# Patient Record
Sex: Female | Born: 1970
Health system: Southern US, Community
[De-identification: ages and names within clinical notes are randomized; demographics above are authoritative.]

## PROBLEM LIST (undated history)

## (undated) DIAGNOSIS — C801 Malignant (primary) neoplasm, unspecified: Secondary | ICD-10-CM

## (undated) DIAGNOSIS — D219 Benign neoplasm of connective and other soft tissue, unspecified: Secondary | ICD-10-CM

## (undated) DIAGNOSIS — Z973 Presence of spectacles and contact lenses: Secondary | ICD-10-CM

## (undated) DIAGNOSIS — Z923 Personal history of irradiation: Secondary | ICD-10-CM

## (undated) DIAGNOSIS — N809 Endometriosis, unspecified: Secondary | ICD-10-CM

## (undated) DIAGNOSIS — E0500 Thyrotoxicosis with diffuse goiter without thyrotoxic crisis or storm: Secondary | ICD-10-CM

## (undated) DIAGNOSIS — I10 Essential (primary) hypertension: Secondary | ICD-10-CM

## (undated) DIAGNOSIS — Z9221 Personal history of antineoplastic chemotherapy: Secondary | ICD-10-CM

## (undated) DIAGNOSIS — J302 Other seasonal allergic rhinitis: Secondary | ICD-10-CM

## (undated) HISTORY — PX: BREAST LUMPECTOMY: SHX2

## (undated) HISTORY — DX: Benign neoplasm of connective and other soft tissue, unspecified: D21.9

## (undated) HISTORY — PX: TUBAL LIGATION: SHX77

---

## 1997-11-11 ENCOUNTER — Other Ambulatory Visit: Admission: RE | Admit: 1997-11-11 | Discharge: 1997-11-11 | Payer: Self-pay | Admitting: Obstetrics

## 1997-12-24 ENCOUNTER — Emergency Department (HOSPITAL_COMMUNITY): Admission: EM | Admit: 1997-12-24 | Discharge: 1997-12-24 | Payer: Self-pay | Admitting: Emergency Medicine

## 1999-01-14 ENCOUNTER — Emergency Department (HOSPITAL_COMMUNITY): Admission: EM | Admit: 1999-01-14 | Discharge: 1999-01-14 | Payer: Self-pay | Admitting: Emergency Medicine

## 2000-02-11 ENCOUNTER — Emergency Department (HOSPITAL_COMMUNITY): Admission: EM | Admit: 2000-02-11 | Discharge: 2000-02-11 | Payer: Self-pay | Admitting: Emergency Medicine

## 2000-10-19 ENCOUNTER — Emergency Department (HOSPITAL_COMMUNITY): Admission: EM | Admit: 2000-10-19 | Discharge: 2000-10-19 | Payer: Self-pay | Admitting: Emergency Medicine

## 2001-03-02 ENCOUNTER — Other Ambulatory Visit: Admission: RE | Admit: 2001-03-02 | Discharge: 2001-03-02 | Payer: Self-pay | Admitting: Obstetrics and Gynecology

## 2001-04-18 ENCOUNTER — Encounter: Payer: Self-pay | Admitting: Obstetrics and Gynecology

## 2001-04-18 ENCOUNTER — Ambulatory Visit (HOSPITAL_COMMUNITY): Admission: RE | Admit: 2001-04-18 | Discharge: 2001-04-18 | Payer: Self-pay | Admitting: Obstetrics and Gynecology

## 2001-08-03 ENCOUNTER — Inpatient Hospital Stay (HOSPITAL_COMMUNITY): Admission: AD | Admit: 2001-08-03 | Discharge: 2001-08-03 | Payer: Self-pay | Admitting: Obstetrics and Gynecology

## 2001-09-30 ENCOUNTER — Inpatient Hospital Stay (HOSPITAL_COMMUNITY): Admission: AD | Admit: 2001-09-30 | Discharge: 2001-10-02 | Payer: Self-pay | Admitting: Obstetrics and Gynecology

## 2002-02-25 ENCOUNTER — Other Ambulatory Visit: Admission: RE | Admit: 2002-02-25 | Discharge: 2002-02-25 | Payer: Self-pay | Admitting: Obstetrics and Gynecology

## 2003-06-03 ENCOUNTER — Other Ambulatory Visit: Admission: RE | Admit: 2003-06-03 | Discharge: 2003-06-03 | Payer: Self-pay | Admitting: Obstetrics and Gynecology

## 2004-08-06 ENCOUNTER — Other Ambulatory Visit: Admission: RE | Admit: 2004-08-06 | Discharge: 2004-08-06 | Payer: Self-pay | Admitting: Obstetrics and Gynecology

## 2004-08-11 ENCOUNTER — Ambulatory Visit: Payer: Self-pay | Admitting: Internal Medicine

## 2005-08-08 ENCOUNTER — Other Ambulatory Visit: Admission: RE | Admit: 2005-08-08 | Discharge: 2005-08-08 | Payer: Self-pay | Admitting: Obstetrics and Gynecology

## 2005-08-17 ENCOUNTER — Encounter: Admission: RE | Admit: 2005-08-17 | Discharge: 2005-08-17 | Payer: Self-pay | Admitting: Obstetrics and Gynecology

## 2006-10-11 ENCOUNTER — Encounter: Admission: RE | Admit: 2006-10-11 | Discharge: 2006-10-11 | Payer: Self-pay | Admitting: Obstetrics and Gynecology

## 2008-08-11 ENCOUNTER — Inpatient Hospital Stay (HOSPITAL_COMMUNITY): Admission: AD | Admit: 2008-08-11 | Discharge: 2008-08-11 | Payer: Self-pay | Admitting: Obstetrics and Gynecology

## 2008-09-16 ENCOUNTER — Inpatient Hospital Stay (HOSPITAL_COMMUNITY): Admission: AD | Admit: 2008-09-16 | Discharge: 2008-09-16 | Payer: Self-pay | Admitting: Obstetrics & Gynecology

## 2008-09-18 ENCOUNTER — Inpatient Hospital Stay (HOSPITAL_COMMUNITY): Admission: RE | Admit: 2008-09-18 | Discharge: 2008-09-20 | Payer: Self-pay | Admitting: Obstetrics and Gynecology

## 2008-09-18 HISTORY — PX: TUBAL LIGATION: SHX77

## 2008-09-19 ENCOUNTER — Encounter: Payer: Self-pay | Admitting: Obstetrics & Gynecology

## 2010-01-08 ENCOUNTER — Encounter: Admission: RE | Admit: 2010-01-08 | Discharge: 2010-01-08 | Payer: Self-pay | Admitting: Obstetrics and Gynecology

## 2010-09-29 LAB — CBC
HCT: 28.5 % — ABNORMAL LOW (ref 36.0–46.0)
HCT: 29.9 % — ABNORMAL LOW (ref 36.0–46.0)
Hemoglobin: 9.3 g/dL — ABNORMAL LOW (ref 12.0–15.0)
Hemoglobin: 9.8 g/dL — ABNORMAL LOW (ref 12.0–15.0)
MCHC: 32.7 g/dL (ref 30.0–36.0)
MCHC: 32.8 g/dL (ref 30.0–36.0)
MCV: 85.3 fL (ref 78.0–100.0)
MCV: 86.2 fL (ref 78.0–100.0)
Platelets: 126 10*3/uL — ABNORMAL LOW (ref 150–400)
Platelets: 141 10*3/uL — ABNORMAL LOW (ref 150–400)
RBC: 3.3 MIL/uL — ABNORMAL LOW (ref 3.87–5.11)
RBC: 3.5 MIL/uL — ABNORMAL LOW (ref 3.87–5.11)
RDW: 16.2 % — ABNORMAL HIGH (ref 11.5–15.5)
RDW: 16.5 % — ABNORMAL HIGH (ref 11.5–15.5)
WBC: 13.2 10*3/uL — ABNORMAL HIGH (ref 4.0–10.5)
WBC: 9.2 10*3/uL (ref 4.0–10.5)

## 2010-09-29 LAB — RPR: RPR Ser Ql: NONREACTIVE

## 2010-11-02 NOTE — H&P (Signed)
NAMELOLETA, FROMMELT         ACCOUNT NO.:  [id number]   MEDICAL RECORD NO.:  [id number]          PATIENT TYPE:  INP   LOCATION:  9168                          FACILITY:  [facility]   PHYSICIAN:  Janine Limbo, M.D.DATE OF BIRTH:  [birth year]   DATE OF ADMISSION:  09/18/2008  DATE OF DISCHARGE:                              HISTORY & PHYSICAL   Dawn Bates is a 40 year old gravida 7, para 2-0-4-2 at 40-4/7 weeks  who presented for induction secondary to polyhydramnios.  She was seen  on September 16, 2008, in the office with a nonreactive NST, but had a  reactive follow-up tracing in maternity admissions.  She had an  ultrasound that day with a questionable shoulder presentation versus  somewhat of an oblique lie.  She reports irregular mild contractions and  positive fetal movement.  Pregnancy has been remarkable for;  1. Polyhydramnios since 28 weeks.  2. TAB x4.  3. 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  4. Group B strep negative.  5. History of HPV.  6. Advanced maternal age with genetic screening declined.   PRENATAL LABS:  Blood type is A+, Rh antibody negative, VDRL  nonreactive, rubella titer positive, hepatitis B surface antigen  negative, HIV was nonreactive.  GC and chlamydia cultures were not done  at her first visit and were declined in the third trimester.  Hemoglobin  upon entering the practice was 12.3, it was 11 at 29 weeks.  The patient  declined first trimester screen and quadruple screen.  She had a normal  Glucola.  Group B strep culture was negative.   HISTORY OF PRESENT PREGNANCY:  The patient entered care at approximately  10 weeks.  She declined first trimester screen.  She had an ultrasound  at 10 weeks giving an [facility] of September 14, 2008, which was congruent with  LMP.  She had some urinary pain at 13 weeks and was treated with  Macrobid.  She had an ultrasound at 18 weeks showing normal growth and  fluid with a posterior  placenta.  She was measuring size greater than  dates at 26 weeks.  She had a normal Glucola.  Her ultrasound at 28  weeks showed an estimated fetal weight at the 75th percentile and AFI of  26.9, which is greater than the 97th percentile.  She then at 31 weeks  had an ultrasound showing the 95th percentile.  She had a fetal  fibronectin at that time that was negative.  She had another ultrasound  at 33 weeks showing fluid at the 75th percentile, then again at 35 weeks  she was noted to have polyhydramnios again with normal growth.  She then  was seen in the office this week for a visit.  She reported decreased  fetal movement.  She was sent to maternity admissions unit for a  nonstress test which was reactive, but she also had an ultrasound at the  office showing a questionable oblique lie with a possible shoulder  presentation.  She was scheduled for induction today per Dr. Lilian Coma  order in light of  her post dates state.   OBSTETRICAL HISTORY:  In 1996, she had a vaginal birth of a female infant,  weight 8 pounds at 38 weeks.  She was in labor 1-2 hours, she had no  anesthesia.  In 2003, she had a vaginal birth of a female infant, weight  7 pounds at 40 weeks, she is in labor 8 hours, she had no anesthesia.  There was some meconium in the fluid.   MEDICAL HISTORY:  She has been on birth control pills, Ortho Evra patch,  Depo-Provera and condoms.  She had one abnormal Pap, but a repeat Pap  was normal.  She had fibroids diagnosed in 1996, but these have not been  an issue for her pregnancy.  She has had a history of four TABs.  She  had a history of HSV 1-2 and has been on Valtrex and acyclovir for  prophylaxis.  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  She reports  usual childhood illnesses.  She is sensitive to latex.  She also has  seasonal allergies.   FAMILY HISTORY:  Her mother, maternal grandmother, maternal aunts and  maternal uncles all have hypertension.  Her mother,  maternal grandmother  and maternal uncles have diabetes.  Her maternal aunt has thyroid  disease and maternal grandmother had some type of renal disease.  Her  mother has had a stroke.  Her maternal uncles has had a stroke.  Her  mother has had some type of intestinal cancer.  Her sister had breast  cancer at age 34, and a paternal aunt had breast cancer.  A paternal  cousin has schizophrenia.   SURGICAL HISTORY:  The previously noted four TABs.   GENETIC HISTORY:  Remarkable for patient's age of 52.   SOCIAL HISTORY:  The patient is single.  The father of the baby has been  involved during this pregnancy.  He is not currently present with her.  The patient is Tree surgeon.  She denies a religious affiliation.  She has a bachelor's degree.  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  Her partner has a high school diploma, he is a Copy.  She has been followed by the physician service at [facility].  She denies any alcohol, drug or tobacco use during this pregnancy.   PHYSICAL EXAMINATION:  VITAL SIGNS:  Stable.  The patient is afebrile.  HEENT:  Within normal limits.  LUNGS:  Breath sounds are clear.  HEART:  Regular rate and rhythm without murmur.  BREASTS:  Soft and nontender.  ABDOMEN:  Fundal height is approximately 40 cm.  Estimated fetal weight  is 7-7-1/2 pounds.  There does feel to be a large amount of fluid around  the baby.  Fetal heart rate currently is nonreactive, but reassuring.  Uterine contractions are about every 8 minutes with some mild low  amplitude contractions between.  PELVIC:  Deferred at present.  There are no HSV lesions noted and no  prodrome reported.   ASSESSMENT:  1. Intrauterine pregnancy at 40-4/7 weeks.  2. Polyhydramnios.  3. Questionable abnormal lie.  4. Group B strep negative.   PLAN:  1. Admit to birthing suite with consult Dr. Stefano Gaul as attending      physician.  2. Check position with formal ultrasound.   3. Will consult with Dr. Stefano Gaul regarding plan of care after      ultrasound is completed.      Dawn Bates, C.N.M.  Janine Limbo, M.D.  Electronically Signed   VLL/MEDQ  D:  09/18/2008  T:  09/18/2008  Job:  [id number]

## 2010-11-02 NOTE — Op Note (Signed)
Dawn Bates, Dawn Bates         ACCOUNT NO.:  [id number]   MEDICAL RECORD NO.:  [id number]          PATIENT TYPE:  INP   LOCATION:  9103                          [facility]ILITY:  [facility]   PHYSICIAN:  Janine Limbo, M.D.DATE OF BIRTH:  [birth year]   DATE OF PROCEDURE:  09/18/2008  DATE OF DISCHARGE:                               OPERATIVE REPORT   PREOPERATIVE DIAGNOSES:  1. A 40 weeks and 4-day gestation.  2. Polyhydramnios.  3. Transverse presentation.   POSTOPERATIVE DIAGNOSES:  1. A 40 weeks and 4-day gestation.  2. Polyhydramnios.  3. Vertex presentation.   PROCEDURE:  External version.   SURGEON:  Janine Limbo, MD   FIRST ASSISTANT:  Renaldo Reel. Emilee Hero, C.N.M.   ANESTHETIC:  None.   DISPOSITION:  Dawn Bates is a 40 year old female, gravida 7, para 2-0-  4-2, who presents at 40 weeks and 4-day gestation.  She has been  followed at the [facility]  [facility].  The patient's pregnancy has been  complicated by polyhydramnios.  She was also noted to have an infant in  a transverse presentation.  We discussed our management options at this  point.  Our options include discharging to home and await spontaneous  labor.  The other option include an attempted external version followed  by induction.  We also discussed primary cesarean delivery.  The risk  and benefits of each of those options were outlined with the patient and  her husband.  My recommendation is that we attempt external version and  is successful then that we proceed with induction of labor.  After  careful consideration, the patient agrees to proceed with an attempted  external version.   FINDINGS:  Prior to her procedure an ultrasound was performed that  showed a single intrauterine gestation with a transverse presentation.  The head was noted to be on the maternal right.  The amniotic fluid  volume was above normal.  The placenta appeared  normal.  The fetal heart  motions were normal on ultrasound.  The nonstress test was noted to be  reactive.  The patient's blood type is A+.   PROCEDURE:  The patient was evaluated on the labor and delivery suite.  A repeat ultrasound was performed with findings as mentioned above.  The  patient's abdomen was then lubricated with ultrasound gel.  We then  applied pressure and the infant was rotated in a counterclockwise motion  until the fetal head was noted to be in the pelvis.  The ultrasound was  used to monitor the infant during our procedure and all fetal heart  motions were noted to be normal.  The patient was placed back on the  nonstress test monitor after successful version and the nonstress test  was noted to be reactive.  We checked the patient's cervix and her  cervix was noted to be fingertip dilated, 25-50% effaced, and the  presenting part was vertex, but still high in the pelvis.  The plan is  to begin Pitocin.  Once the vertex applied to the pelvis then we will  rupture membranes.  Janine Limbo, M.D.  Electronically Signed     AVS/MEDQ  D:  09/18/2008  T:  09/19/2008  Job:  [id number]

## 2010-11-02 NOTE — Op Note (Signed)
Dawn Bates, SIMIC         ACCOUNT NO.:  [id number]   MEDICAL RECORD NO.:  [id number]          PATIENT TYPE:  INP   LOCATION:  9103                          [facility]  [facility]   PHYSICIAN:  Naima A. Dillard, M.D. DATE OF BIRTH:  1971-04-08   DATE OF PROCEDURE:  09/19/2008  DATE OF DISCHARGE:                               OPERATIVE REPORT   PREOPERATIVE DIAGNOSIS:  Multiparity, desires tubal ligation.   POSTOPERATIVE DIAGNOSIS:  Multiparity, desires tubal ligation.   PROCEDURES:  Postpartum tubal ligation.   SURGEON:  Naima A. Dillard, M.D.   ASSISTANT:  There are no assistants.   ANESTHESIA:  Epidural.   FINDINGS:  Normal tubes bilaterally.   SPECIMENS:  Portions of both right and left tubes, sent to pathology.   ESTIMATED BLOOD LOSS:  There was minimal blood loss with no  complications.   The patient was taken to the PACU in stable condition.   PROCEDURE IN DETAIL:  The patient taken to the operating room where her  epidural anesthesia was found to be adequate.  A 1-cm infraumbilical  incision was made with a scalpel after 10 mL of local was placed in the  same area.  The fascia was dissected without difficulty and entered and  dissected bilaterally using a Mayo scissors and moved already in the  peritoneal cavity and the patient's left fallopian tube was grasped with  a Babcock clamp to the fimbriated end and about a centimeter of this  portion of tube was ligated with 2-0 plane and excised.  Hemostasis was  obtained, also using Bovie cautery.  Hemostasis was assured.  The  patient's right fallopian tube was grasped with Babcock clamp about a  centimeter of mid superior portion of the tube was ligated with 2-0  plane and excised and also made hemostatic with bovie cautery.  Hemostasis was assured.  Both tubes were returned to the abdomen.  The  fascia was reapproximated using 0-Vicryl.  The skin was reapproximated  using 3-0 Monocryl in a subcuticular fashion.   Sponge, lap, and needle  counts were correct and before the procedure, the patient was told the  risks of this  procedure to include, but not limited to bleeding, infection, and damage  to internal organs such as bowel and bladder and failure rate of about 1  in 200.  All contraception was also reviewed with the patient.  The  patient desired to proceed.      Naima A. Normand Sloop, M.D.  Electronically Signed     NAD/MEDQ  D:  09/19/2008  T:  09/20/2008  Job:  [id number]

## 2010-11-02 NOTE — Discharge Summary (Signed)
Dawn Bates, Dawn Bates         ACCOUNT NO.:  [id number]   MEDICAL RECORD NO.:  [id number]          PATIENT TYPE:  INP   LOCATION:  9103                          [facility]   PHYSICIAN:  Naima A. Dillard, M.D. DATE OF BIRTH:  [birth year]   DATE OF ADMISSION:  09/18/2008  DATE OF DISCHARGE:                               DISCHARGE SUMMARY   ADMISSION DIAGNOSES:  1. Intrauterine pregnancy at 40 and 4/7th weeks.  2. Polyhydramnios.  3. Abnormal fetal lie.   DISCHARGE DIAGNOSES:  1. Intrauterine pregnancy at 40 and 4/7th weeks, delivered.  2. Abnormal lie, resolved.  3. Desired bilateral tubal ligation.   PROCEDURES:  1. External cephalic version.  2. Spontaneous vaginal delivery.  3. Bilateral tubal ligation.   HOSPITAL COURSE:  [name] is a 40 year old gravida 7, para 2-0-4-2  admitted at 32 and 4/7th weeks for induction of labor due to  polyhydramnios.  The patient's pregnancy remarkable for,  1. Polyhydramnios since 28 weeks.  2. History of TAB x4.  3. History of HSV 1 and 2, no recent or current lesions.  4. Group B strep negative.  5. History of HPV.  6. Advanced maternal age with genetic screening declined.   Upon admission, fetal position was confirmed with oblique transverse lie  with ultrasound.  The risks, benefits, and alternatives of external  cephalic version versus an induction of labor as well as cesarean  section, discharge to home, and delivery were discussed with the patient  by Dr. Stefano Gaul.  After discussion, the patient elected to proceed with  external cephalic version.  Fetal heart rate upon admission was  reassuring.   On the day of admission, the patient underwent external cephalic version  with successful resolution of oblique transverse lie to vertex  presentation.  Due to this successful version, induction was started.  The patient's labor was induced with Pitocin.  The patient's labor  progressed and on the day of admission the  patient underwent a vaginal  delivery with an intact perineum and delivered a viable female infant,  [name], 8 pounds 8 ounces with Apgars of 8 and 9 at one and five minutes  respectively.  Estimated blood loss with vaginal delivery 300 mL.  No  perineal lacerations were noted.  The patient's delivery was  uncomplicated.   The patient desired bilateral tubal ligation and risks, benefits, and  alternatives were again reviewed with the patient.  On postpartum day  #1, the patient underwent a bilateral tubal ligation without  complications.  Estimated blood loss was minimal.  The patient with  epidural anesthesia for bilateral tubal ligation.  On postpartum day #1,  the patient's hemoglobin noted to be 9.3 and platelets 126,000.  Upon  admission, the patient's hemoglobin 9.8 and platelets 141,000.  On  postpartum day #2, postoperative day #1, the patient was ambulating,  voiding, and tolerating p.o. liquids and solids without difficulty.  The  patient with no abnormal bleeding.  It was felt that the patient  received full benefit of her hospital stay and was discharged to home.  The patient's condition on discharge was stable.   DISCHARGE INSTRUCTIONS:  Per [facility] handout.   DISCHARGE MEDICATIONS:  1. Motrin 600 mg p.o. q.6 h. p.r.n. pain.  2. Tylox 1-2 tablets every 4-6 hours p.r.n. pain.   Discharge follow up will occur in [facility] in 6 weeks.      Rhona Leavens, CNM      Naima A. Normand Sloop, M.D.  Electronically Signed    NOS/MEDQ  D:  09/20/2008  T:  09/20/2008  Job:  [id number]

## 2010-11-05 NOTE — H&P (Signed)
[facility]  Patient:    Dawn Bates, Dawn Bates Visit Number: [id number] MRN: [id number]          Service Type: OBS Location: 910A [id number] [id number] Attending Physician:  Michael Litter Dictated by:   Wynelle Bourgeois, CNM Admit Date:  09/30/2001                           History and Physical  HISTORY OF PRESENT ILLNESS:   This is a 40 year old gravida 4, para 1-0-2-1, at 40-2/7ths weeks, who presents with complaints of regular uterine contractions times several hours.  She denies leaking or bleeding, and reports positive fetal movement.  Her pregnancy has been followed by the M.D. service and remarkable for: 1. History of rapid labor.  2. HPV.  3. A family history of ovarian cancer and sickle L trait.  4. Group B strep negative.  OBSTETRICAL HISTORY:          Remarkable for an elective AB in 1989, an elective AB in 1993, a spontaneous vaginal delivery in 1996 remarkable for rapid labor of a female infant at [redacted] weeks gestation with less than one hour of labor.  PRENATAL LABORATORIES:        Hemoglobin 13.3, platelets 193.  Blood type A positive, antibody screen negative.  Sickle cell negative.  Rubella immune. RPR nonreactive.  Hepatitis negative.  HIV declined.  Pap test normal. Gonorrhea negative.  Chlamydia negative.  Glucose challenge within normal limits.  Group B strep negative.  MEDICAL HISTORY:              Remarkable for HPV, fibroids, a remote history of gonorrhea which was treated years ago and a history of migraines.  SURGICAL HISTORY:             Remarkable for wisdom teeth extraction.  GENETIC HISTORY:              Unremarkable.  SOCIAL HISTORY:               The patient is single, but involved with Janae Bridgeman who is involved and supportive.  She is of the [employer].  She denies any alcohol, tobacco or drug use.  OBJECTIVE DATA:  VITAL SIGNS:                  Vital signs stable, afebrile.  HEENT:                        Within  normal limits.  NECK:                         Thyroid normal, not enlarged.  BREASTS:                      Soft, nontender, no masses.  HEART RATE:                   Regular rate and rhythm.  CHEST:                        Clear.  ABDOMEN:                      Gravid, vertex to [employer].  EFM shows a reactive fetal heart rate without decelerations, uterine contractions every 5-7 minutes and strong.  PELVIC:  Cervical exam is 5 cm, completely effaced, -2 station, bulging bag of waters, and vertex presentation.  EXTREMITIES:                  Within normal limits.  ASSESSMENT:                   1. Active labor at term.                               2. Group B Streptococcus negative.  PLAN:                         1. Admit to Dr. Normand Sloop.                               2. Routine M.D. orders.                               3. Anticipate SVD. Dictated by:   Wynelle Bourgeois, CNM Attending Physician:  Michael Litter DD:  09/30/01 TD:  09/30/01 Job: [id number]

## 2012-02-15 ENCOUNTER — Other Ambulatory Visit: Payer: Self-pay | Admitting: Obstetrics and Gynecology

## 2012-02-15 DIAGNOSIS — Z1231 Encounter for screening mammogram for malignant neoplasm of breast: Secondary | ICD-10-CM

## 2012-02-24 ENCOUNTER — Ambulatory Visit
Admission: RE | Admit: 2012-02-24 | Discharge: 2012-02-24 | Disposition: A | Discharge status: Home / Self Care | Payer: BC Managed Care – PPO | Source: Ambulatory Visit | Attending: Obstetrics and Gynecology | Admitting: Obstetrics and Gynecology

## 2012-02-24 DIAGNOSIS — Z1231 Encounter for screening mammogram for malignant neoplasm of breast: Secondary | ICD-10-CM

## 2012-03-06 ENCOUNTER — Encounter: Payer: Self-pay | Admitting: Obstetrics and Gynecology

## 2012-04-13 ENCOUNTER — Ambulatory Visit: Payer: Self-pay | Admitting: Obstetrics and Gynecology

## 2012-04-23 ENCOUNTER — Ambulatory Visit: Payer: Self-pay | Admitting: Obstetrics and Gynecology

## 2012-05-23 ENCOUNTER — Ambulatory Visit: Payer: Self-pay | Admitting: Obstetrics and Gynecology

## 2012-07-03 ENCOUNTER — Ambulatory Visit: Payer: Self-pay | Admitting: Obstetrics and Gynecology

## 2012-07-26 ENCOUNTER — Ambulatory Visit: Payer: Self-pay | Admitting: Obstetrics and Gynecology

## 2012-08-28 ENCOUNTER — Ambulatory Visit: Payer: BC Managed Care – PPO | Admitting: Obstetrics and Gynecology

## 2012-08-28 ENCOUNTER — Encounter: Payer: Self-pay | Admitting: Obstetrics and Gynecology

## 2012-08-28 VITALS — BP 100/62 | Resp 14 | Ht 65.5 in | Wt 148.0 lb

## 2012-08-28 DIAGNOSIS — Z124 Encounter for screening for malignant neoplasm of cervix: Secondary | ICD-10-CM

## 2012-08-28 NOTE — Progress Notes (Signed)
Last Pap: 01/05/2011 WNL: Yes Regular Periods:yes "Cycles are heavy, pt asked b/c of fibroids are they the reason she is having clots? Contraception: BTL  Monthly Breast exam:no Tetanus<41yrs:no Nl.Bladder Function:yes Daily BMs:no "Once or twice a week" Healthy Diet:yes Calcium:no Mammogram:yes Date of Mammogram: 02/2012 "Dense Breast" Exercise:yes Have often Exercise: Walking 2-3 days a week Seatbelt: yes Abuse at home: no "Verbal" Stressful work:yes Sigmoid-colonoscopy: Never per pt Bone Density: No PCP: [facility]gent [facility] on [address] Change in PMH: Unchanged BP 100/62  Resp 14  Ht 5' 5.5" (1.664 m)  Wt 148 lb (67.132 kg)  BMI 24.25 kg/m2  LMP 08/18/2012 Pt with complaints:yes and pt states since her BTL she has had heavier cycles.  Changes a pad every 2-3 hrs.  No Cp or SOB Physical Examination: General appearance - alert, well appearing, and in no distress Mental status - normal mood, behavior, speech, dress, motor activity, and thought processes Neck - supple, no significant adenopathy,  thyroid exam: thyroid is normal in size without nodules or tenderness Chest - clear to auscultation, no wheezes, rales or rhonchi, symmetric air entry Heart - normal rate and regular rhythm Abdomen - soft, nontender, nondistended, no masses or organomegaly Breasts - breasts appear normal, no suspicious masses, no skin or nipple changes or axillary nodes Pelvic - normal external genitalia, vulva, vagina, cervix, uterus and adnexa Rectal - rectal exam not indicated Back exam - full range of motion, no tenderness, palpable spasm or pain on motion Neurological - alert, oriented, normal speech, no focal findings or movement disorder noted Musculoskeletal - no joint tenderness, deformity or swelling Extremities - no edema, redness or tenderness in the calves or thighs Skin - normal coloration and turgor, no rashes, no suspicious skin lesions noted Routine exam Pap sent yes Mammogram due 9/14   BTL used for contraception RT 1 yr.  Pt declined [place] and EMBX.  Will call if bleeding gets worse Change in ZOX:[id number]

## 2012-08-29 LAB — PAP IG W/ RFLX HPV ASCU

## 2013-03-25 ENCOUNTER — Ambulatory Visit
Admission: RE | Admit: 2013-03-25 | Discharge: 2013-03-25 | Disposition: A | Discharge status: Home / Self Care | Payer: 59 | Source: Ambulatory Visit | Attending: Obstetrics and Gynecology | Admitting: Obstetrics and Gynecology

## 2013-03-25 ENCOUNTER — Other Ambulatory Visit: Payer: Self-pay | Admitting: Obstetrics and Gynecology

## 2013-03-25 DIAGNOSIS — R05 Cough: Secondary | ICD-10-CM

## 2013-03-25 DIAGNOSIS — R053 Chronic cough: Secondary | ICD-10-CM

## 2013-04-18 ENCOUNTER — Other Ambulatory Visit: Payer: Self-pay

## 2013-04-18 DIAGNOSIS — Z1231 Encounter for screening mammogram for malignant neoplasm of breast: Secondary | ICD-10-CM

## 2013-04-25 ENCOUNTER — Other Ambulatory Visit: Payer: Self-pay

## 2013-06-10 ENCOUNTER — Ambulatory Visit
Admission: RE | Admit: 2013-06-10 | Discharge: 2013-06-10 | Disposition: A | Discharge status: Home / Self Care | Payer: 59 | Source: Ambulatory Visit

## 2013-06-10 ENCOUNTER — Ambulatory Visit: Payer: BC Managed Care – PPO

## 2013-06-10 DIAGNOSIS — Z1231 Encounter for screening mammogram for malignant neoplasm of breast: Secondary | ICD-10-CM

## 2014-04-21 ENCOUNTER — Encounter: Payer: Self-pay | Admitting: Obstetrics and Gynecology

## 2014-05-19 ENCOUNTER — Other Ambulatory Visit: Payer: Self-pay

## 2014-05-19 DIAGNOSIS — Z1231 Encounter for screening mammogram for malignant neoplasm of breast: Secondary | ICD-10-CM

## 2014-06-12 ENCOUNTER — Ambulatory Visit
Admission: RE | Admit: 2014-06-12 | Discharge: 2014-06-12 | Disposition: A | Discharge status: Home / Self Care | Payer: 59 | Source: Ambulatory Visit

## 2014-06-12 DIAGNOSIS — Z1231 Encounter for screening mammogram for malignant neoplasm of breast: Secondary | ICD-10-CM

## 2015-06-19 ENCOUNTER — Other Ambulatory Visit: Payer: Self-pay

## 2015-06-19 ENCOUNTER — Ambulatory Visit
Admission: RE | Admit: 2015-06-19 | Discharge: 2015-06-19 | Disposition: A | Discharge status: Home / Self Care | Payer: Commercial Managed Care - HMO | Source: Ambulatory Visit

## 2015-06-19 DIAGNOSIS — Z1231 Encounter for screening mammogram for malignant neoplasm of breast: Secondary | ICD-10-CM

## 2016-06-17 ENCOUNTER — Other Ambulatory Visit: Payer: Self-pay | Admitting: Obstetrics and Gynecology

## 2016-06-17 DIAGNOSIS — Z1231 Encounter for screening mammogram for malignant neoplasm of breast: Secondary | ICD-10-CM

## 2016-07-11 ENCOUNTER — Ambulatory Visit: Payer: Commercial Managed Care - HMO

## 2016-08-01 ENCOUNTER — Ambulatory Visit
Admission: RE | Admit: 2016-08-01 | Discharge: 2016-08-01 | Disposition: A | Discharge status: Home / Self Care | Payer: Commercial Managed Care - HMO | Source: Ambulatory Visit | Attending: Obstetrics and Gynecology | Admitting: Obstetrics and Gynecology

## 2016-08-01 DIAGNOSIS — Z1231 Encounter for screening mammogram for malignant neoplasm of breast: Secondary | ICD-10-CM

## 2017-08-25 ENCOUNTER — Other Ambulatory Visit: Payer: Self-pay | Admitting: Obstetrics and Gynecology

## 2017-08-25 ENCOUNTER — Observation Stay (HOSPITAL_COMMUNITY)
Admission: AD | Admit: 2017-08-25 | Discharge: 2017-08-26 | Disposition: A | Discharge status: Home / Self Care | Payer: 59 | Source: Ambulatory Visit | Attending: Obstetrics and Gynecology | Admitting: Obstetrics and Gynecology

## 2017-08-25 ENCOUNTER — Other Ambulatory Visit: Payer: Self-pay

## 2017-08-25 DIAGNOSIS — Z91040 Latex allergy status: Secondary | ICD-10-CM | POA: Diagnosis not present

## 2017-08-25 DIAGNOSIS — R5383 Other fatigue: Secondary | ICD-10-CM | POA: Diagnosis present

## 2017-08-25 DIAGNOSIS — D649 Anemia, unspecified: Secondary | ICD-10-CM | POA: Diagnosis not present

## 2017-08-25 LAB — CBC WITH DIFFERENTIAL/PLATELET
Basophils Absolute: 0.1 10*3/uL (ref 0.0–0.1)
Basophils Relative: 1 %
Eosinophils Absolute: 0.3 10*3/uL (ref 0.0–0.7)
Eosinophils Relative: 5 %
HCT: 21.8 % — ABNORMAL LOW (ref 36.0–46.0)
Hemoglobin: 5.9 g/dL — CL (ref 12.0–15.0)
Lymphocytes Relative: 50 %
Lymphs Abs: 2.4 10*3/uL (ref 0.7–4.0)
MCH: 16.3 pg — ABNORMAL LOW (ref 26.0–34.0)
MCHC: 27.1 g/dL — ABNORMAL LOW (ref 30.0–36.0)
MCV: 60.1 fL — ABNORMAL LOW (ref 78.0–100.0)
Monocytes Absolute: 0.3 10*3/uL (ref 0.1–1.0)
Monocytes Relative: 5 %
Neutro Abs: 2 10*3/uL (ref 1.7–7.7)
Neutrophils Relative %: 39 %
Other: 0 %
Platelets: 259 10*3/uL (ref 150–400)
RBC: 3.63 MIL/uL — ABNORMAL LOW (ref 3.87–5.11)
RDW: 20.9 % — ABNORMAL HIGH (ref 11.5–15.5)
WBC: 5.1 10*3/uL (ref 4.0–10.5)

## 2017-08-25 LAB — PREPARE RBC (CROSSMATCH)

## 2017-08-25 LAB — ABO/RH: ABO/RH(D): A POS

## 2017-08-25 MED ORDER — SODIUM CHLORIDE 0.9 % IV SOLN
Freq: Once | INTRAVENOUS | Status: AC
  Administered 2017-08-25: 17:00:00 via INTRAVENOUS

## 2017-08-25 MED ORDER — ACETAMINOPHEN 325 MG PO TABS
650.0000 mg | ORAL_TABLET | Freq: Once | ORAL | Status: AC
  Administered 2017-08-25: 650 mg via ORAL
  Filled 2017-08-25: qty 2

## 2017-08-25 MED ORDER — PRENATAL MULTIVITAMIN CH: 1.0000 | ORAL_TABLET | Freq: Every day | ORAL | Status: DC

## 2017-08-25 MED ORDER — DIPHENHYDRAMINE HCL 25 MG PO CAPS
25.0000 mg | ORAL_CAPSULE | Freq: Once | ORAL | Status: AC
  Administered 2017-08-25: 25 mg via ORAL
  Filled 2017-08-25: qty 1

## 2017-08-25 NOTE — H&P (Signed)
Reason for admission:   Severe anemia admitted for transfusion  History:     Dawn Bates is a 47 y.o. female, G7P3, presenting today for transfusion of PRC due to critical value anemia. She is followed by Dr Charlesetta Garibaldi at [facility] and was seen 08/24/17 in the office for her annual exam.Labb were drawn for complaints of fatigue resulting in normal TSH and Hgb  5.7 / hematocrit 21.9. She reports monthly menstrual cycles lasting 7 days with 4 heavy days requiring 1 pad every 2 hours with clots ad 2 cm. Dysmenorrhea for 48 hours, radiating to legs, 3/10. Has known about fibroids for 9 years. No recent evaluation.  Review of system:   Remarkable for fatigue,  Weight gain of 22 lbs in the last year, ankle edema and constipation while using Ferrous sulfate  Past Medical History:       Past Medical History:  Diagnosis Date  . Fibroids     Allergies:  Latex  Social History:   Social History        Socioeconomic History  . Marital status: Married    Spouse name: Not on file  . Number of children: Not on file  . Years of education: Not on file  . Highest education level: Not on file  Social Needs  . Financial resource strain: Not on file  . Food insecurity - worry: Not on file  . Food insecurity - inability: Not on file  . Transportation needs - medical: Not on file  . Transportation needs - non-medical: Not on file  Occupational History  . Not on file  Tobacco Use  . Smoking status: Never Smoker  . Smokeless tobacco: Never Used  Substance and Sexual Activity  . Alcohol use: No  . Drug use: No  . Sexual activity: Yes    Partners: Male    Birth control/protection: Surgical    Comment: BTL   Other Topics Concern  . Not on file  Social History Narrative  . Not on file    Family History:         Family History  Problem Relation Age of Onset  . Cancer Sister 59       breast "onset Late 20's"  . Breast cancer Sister   . Breast cancer  Maternal Aunt     Physical exam:    General Appearance: Alert, appropriate appearance for age. No acute distress Neck / Thyroid: Supple, no masses, nodes or enlargement Lungs: clear to auscultation bilaterally Back: No CVA tenderness. Cardiovascular: Regular rate and rhythm. S1, S2, no murmur Gastrointestinal: Soft, non-tender, no masses or organomegaly Pelvic Exam: normal except for enlarged irregular uterus per Dr Berneta Sages note   Assessment:   Severe anemia in patient known for fibroids, not currently bleeding   Plan:    23 hour observation Transfuse 3 units PRC with 4 hour Hgb/Ht Reviewed R&B of transfusion. Questions answered. Will plan for follow-up with Dr Charlesetta Garibaldi for management of menorrhagia Patient agreeable

## 2017-08-25 NOTE — H&P (Signed)
   Reason for admission:   Severe anemia admitted for transfusion  History:     Dawn Bates is a 47 y.o. female, G7P3, presenting today for transfusion of PRC due to critical value anemia. She is followed by Dr Charlesetta Garibaldi at [facility] and was seen 08/24/17 in the office for her annual exam.Labb were drawn for complaints of fatigue resulting in normal TSH and Hgb  5.7 / hematocrit 21.9. She reports monthly menstrual cycles lasting 7 days with 4 heavy days requiring   Review of system:   Remarkable for fatigue,  Weight gain of 22 lbs in the last year, ankle edema and constipation while using Ferrous sulfate  Past Medical History:   Past Medical History:  Diagnosis Date  . Fibroids     Allergies:  Latex  Social History:   Social History   Socioeconomic History  . Marital status: Married    Spouse name: Not on file  . Number of children: Not on file  . Years of education: Not on file  . Highest education level: Not on file  Social Needs  . Financial resource strain: Not on file  . Food insecurity - worry: Not on file  . Food insecurity - inability: Not on file  . Transportation needs - medical: Not on file  . Transportation needs - non-medical: Not on file  Occupational History  . Not on file  Tobacco Use  . Smoking status: Never Smoker  . Smokeless tobacco: Never Used  Substance and Sexual Activity  . Alcohol use: No  . Drug use: No  . Sexual activity: Yes    Partners: Male    Birth control/protection: Surgical    Comment: BTL   Other Topics Concern  . Not on file  Social History Narrative  . Not on file    Family History:    Family History  Problem Relation Age of Onset  . Cancer Sister 85       breast "onset Late 20's"  . Breast cancer Sister   . Breast cancer Maternal Aunt     Physical exam:    General Appearance: Alert, appropriate appearance for age. No acute distress Neck / Thyroid: Supple, no masses, nodes or enlargement Lungs: clear to  auscultation bilaterally Back: No CVA tenderness. Cardiovascular: Regular rate and rhythm. S1, S2, no murmur Gastrointestinal: Soft, non-tender, no masses or organomegaly Pelvic Exam: normal except for enlarged irregular uterus per Dr Berneta Sages note   Assessment:   Severe anemia in patient known for fibroids, not currently bleeding   Plan:

## 2017-08-26 ENCOUNTER — Encounter (HOSPITAL_COMMUNITY): Payer: Self-pay | Admitting: *Deleted

## 2017-08-26 DIAGNOSIS — D649 Anemia, unspecified: Secondary | ICD-10-CM | POA: Diagnosis not present

## 2017-08-26 LAB — HEMOGLOBIN AND HEMATOCRIT, BLOOD
HCT: 27.5 % — ABNORMAL LOW (ref 36.0–46.0)
Hemoglobin: 8.4 g/dL — ABNORMAL LOW (ref 12.0–15.0)

## 2017-08-26 NOTE — Discharge Summary (Addendum)
Physician Discharge Summary   Patient ID: Dawn Bates [id number] 47 y.o. [birth year]  Admit date: 08/25/2017  Discharge date and time: No discharge date for patient encounter.   Admitting Physician: Delsa Bern, MD   Discharge Physician: Delsa Bern, MD  Admission Diagnoses: Severe anemia  Discharge Diagnoses: Severe anemia  Admission Condition: good  Discharged Condition: good  Indication for Admission: Hgb of 5.9 and symptomatic  Hospital Course: Pt was fatigued with blood work drawn.  Was noted to have a hgb 5.9.  Pt was admitted for 3 units of PRBC.  Repeat CBC this am 8.4  Consults: None  Significant Diagnostic Studies: labs: Hgb 5.9 Treatments: blood transfusion  Discharge Exam: BP 119/71   Pulse (!) 58   Temp 97.7 F (36.5 C) (Oral)   Resp 20   Ht 5\' 4"  (1.626 m)   Wt 73.5 kg (162 lb)   SpO2 100%   BMI 27.81 kg/m  General alert orientated no distress Heart RRR Lungs clear Abd soft nondistended Legs negative       Disposition:   Patient Instructions:  Allergies as of 08/26/2017      Reactions   Latex Itching      Medication List    TAKE these medications   calcium carbonate 500 MG chewable tablet Commonly known as:  TUMS - dosed in mg elemental calcium Chew 1 tablet by mouth daily as needed for indigestion or heartburn.   fluocinonide ointment 0.05 % Commonly known as:  LIDEX Apply to scalp three times per week   levocetirizine 5 MG tablet Commonly known as:  XYZAL Take 5 mg by mouth every evening.   valACYclovir 500 MG tablet Commonly known as:  VALTREX valacyclovir 500 mg tablet  take 1 tablet by mouth once daily      Activity: activity as tolerated Diet: regular diet Wound Care: none needed  Follow-up with Dr. Charlesetta Garibaldi in 1 week.  Signed: Pleas Koch Prothero 08/26/2017 5:53 AM

## 2017-08-26 NOTE — Discharge Instructions (Signed)

## 2017-08-26 NOTE — Progress Notes (Signed)

## 2017-08-27 LAB — TYPE AND SCREEN
ABO/RH(D): A POS
Antibody Screen: NEGATIVE
Unit division: 0
Unit division: 0
Unit division: 0

## 2017-08-27 LAB — BPAM RBC
Blood Product Expiration Date: 201903252359
Blood Product Expiration Date: 201903252359
Blood Product Expiration Date: 201903272359
ISSUE DATE / TIME: 201903081948
ISSUE DATE / TIME: 201903082241
ISSUE DATE / TIME: 201903090125
Unit Type and Rh: 600
Unit Type and Rh: 600
Unit Type and Rh: 6200

## 2017-09-01 ENCOUNTER — Other Ambulatory Visit: Payer: Self-pay | Admitting: Obstetrics and Gynecology

## 2017-12-10 ENCOUNTER — Emergency Department (HOSPITAL_COMMUNITY)
Admission: EM | Admit: 2017-12-10 | Discharge: 2017-12-10 | Disposition: A | Discharge status: Home / Self Care | Payer: 59 | Attending: Emergency Medicine | Admitting: Emergency Medicine

## 2017-12-10 ENCOUNTER — Other Ambulatory Visit: Payer: Self-pay

## 2017-12-10 ENCOUNTER — Encounter (HOSPITAL_COMMUNITY): Payer: Self-pay | Admitting: Emergency Medicine

## 2017-12-10 DIAGNOSIS — Z79899 Other long term (current) drug therapy: Secondary | ICD-10-CM | POA: Insufficient documentation

## 2017-12-10 DIAGNOSIS — Y939 Activity, unspecified: Secondary | ICD-10-CM | POA: Diagnosis not present

## 2017-12-10 DIAGNOSIS — Y92410 Unspecified street and highway as the place of occurrence of the external cause: Secondary | ICD-10-CM | POA: Insufficient documentation

## 2017-12-10 DIAGNOSIS — S161XXA Strain of muscle, fascia and tendon at neck level, initial encounter: Secondary | ICD-10-CM | POA: Insufficient documentation

## 2017-12-10 DIAGNOSIS — R51 Headache: Secondary | ICD-10-CM | POA: Insufficient documentation

## 2017-12-10 DIAGNOSIS — Y998 Other external cause status: Secondary | ICD-10-CM | POA: Diagnosis not present

## 2017-12-10 DIAGNOSIS — Z91040 Latex allergy status: Secondary | ICD-10-CM | POA: Insufficient documentation

## 2017-12-10 DIAGNOSIS — S0990XA Unspecified injury of head, initial encounter: Secondary | ICD-10-CM | POA: Diagnosis present

## 2017-12-10 DIAGNOSIS — R519 Headache, unspecified: Secondary | ICD-10-CM

## 2017-12-10 DIAGNOSIS — T148XXA Other injury of unspecified body region, initial encounter: Secondary | ICD-10-CM

## 2017-12-10 HISTORY — DX: Other seasonal allergic rhinitis: J30.2

## 2017-12-10 MED ORDER — METHOCARBAMOL 500 MG PO TABS: 500.0000 mg | ORAL_TABLET | Freq: Every evening | ORAL | 0 refills | Status: DC | PRN
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
## 2017-12-10 NOTE — Discharge Instructions (Signed)
Take aleve 2 times a day with meals.  Do not take other anti-inflammatories at the same time open (Advil, Motrin, naproxen, ibuprofen). You may supplement with Tylenol if you need further pain control. Use robaxin as needed for muscle stiffness or soreness.  Have caution, this may make you tired or groggy.  Do not drive or operate heavy machinery while taking this medicine. Use heating pads to help control your pain. Try muscle creams (salonpas, icy hot, bengay) for pain relief. You will likely have continued muscle stiffness and soreness over the next couple days.  Follow-up with primary care for further evaluation. Return to the emergency room if you develop vision changes, vomiting, slurred speech, numbness, loss of bowel or bladder control, or any new or worsening symptoms.
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
## 2017-12-10 NOTE — ED Notes (Signed)
Patient verbalizes understanding of discharge instructions. Opportunity for questioning and answers were provided. Armband removed by staff, pt discharged from ED ambulatory.   

## 2018-06-20 DIAGNOSIS — U071 COVID-19: Secondary | ICD-10-CM

## 2018-06-20 HISTORY — DX: COVID-19: U07.1

## 2019-06-21 DIAGNOSIS — E559 Vitamin D deficiency, unspecified: Secondary | ICD-10-CM

## 2019-06-21 HISTORY — DX: Vitamin D deficiency, unspecified: E55.9

## 2019-07-16 ENCOUNTER — Ambulatory Visit: Payer: 59 | Admitting: Family Medicine

## 2019-07-16 ENCOUNTER — Other Ambulatory Visit: Payer: Self-pay

## 2019-07-16 ENCOUNTER — Encounter: Payer: Self-pay | Admitting: Family Medicine

## 2019-07-16 VITALS — BP 122/70 | HR 71 | Temp 96.3°F | Ht 66.0 in | Wt 159.0 lb

## 2019-07-16 DIAGNOSIS — R519 Headache, unspecified: Secondary | ICD-10-CM | POA: Diagnosis not present

## 2019-07-16 DIAGNOSIS — E049 Nontoxic goiter, unspecified: Secondary | ICD-10-CM

## 2019-07-16 DIAGNOSIS — J301 Allergic rhinitis due to pollen: Secondary | ICD-10-CM | POA: Diagnosis not present

## 2019-07-16 DIAGNOSIS — E559 Vitamin D deficiency, unspecified: Secondary | ICD-10-CM

## 2019-07-16 DIAGNOSIS — D500 Iron deficiency anemia secondary to blood loss (chronic): Secondary | ICD-10-CM

## 2019-07-16 DIAGNOSIS — E785 Hyperlipidemia, unspecified: Secondary | ICD-10-CM

## 2019-07-16 MED ORDER — MOMETASONE FUROATE 50 MCG/ACT NA SUSP: 2.0000 | Freq: Every day | NASAL | 3 refills | Status: DC

## 2019-07-16 MED ORDER — LEVOCETIRIZINE DIHYDROCHLORIDE 5 MG PO TABS: 5.0000 mg | ORAL_TABLET | Freq: Every evening | ORAL | 2 refills | Status: DC

## 2019-07-16 NOTE — Progress Notes (Signed)
HPI:   [name] is a 49 y.o. female, who is here today with her husband to establish care.  Former PCP: [facility]. Last preventive routine visit: 08/2018.  Chronic medical problems: Chronic headache, HLD,anemia,and allergies among some.  Concerns today: Refill on medications.  Nasal congestion and rhinorrhea, can not breath through her nose, interferes with sleep. She has tried different antihistaminics and Xyzal 5 mg helps for longer. She has symptoms all year long. She has not identified exacerbating or alleviating factors.  Negative for cough,SOB,or wheezing.  Tried Flonase,singulair (caused leg pain), Benadryl. According to pt,she already saw immunologist and allergy test was done;immunotherapy was not recommended.  Anemia:  Iron deficiency. She is on Iron supplementation. Heavy menses/fibroid tumors. Menstrual flow improved with Lysteda. She is not aware of coagulation disorders and negative FHx.  Negative for fever,chills,night sweats, frequent nose/gum bleed,easy bruising,melena,blood in stool,or gross hematuria.  No pica. No known FHx of colon cancer.  Lab Results  Component Value Date   WBC 5.1 08/25/2017   HGB 8.4 (L) 08/26/2017   HCT 27.5 (L) 08/26/2017   MCV 60.1 (L) 08/25/2017   PLT 259 08/25/2017   Last lab 08/2018.  HLD: She is on non pharmacologic treatment. "Little high." Negative for chest pain, dyspnea, palpitation, claudication, or edema.  Headaches have improved overtime. She has about 2-3 episodes per months of frontal pressure like headache. No associated visual changes,photophobia,N/V, or focal neurologic deficit.  She takes Fiorinal 50-325-40 mg daily prn.  Vit D deficiency: She is on Ergocalciferol 50,000 U weekly. She is not sure when 25 OH vit D was last checked.  Review of Systems  Constitutional: Positive for fatigue. Negative for activity change, appetite change and unexpected weight change.    HENT: Positive for postnasal drip. Negative for mouth sores, sore throat and trouble swallowing.   Eyes: Negative for redness and visual disturbance.  Gastrointestinal: Negative for abdominal pain.       Negative for changes in bowel habits.  Endocrine: Negative for cold intolerance and heat intolerance.  Genitourinary: Negative for decreased urine volume and dysuria.  Musculoskeletal: Negative for gait problem and myalgias.  Skin: Negative for rash and wound.  Allergic/Immunologic: Positive for environmental allergies.  Neurological: Negative for syncope, facial asymmetry and numbness.  Hematological: Negative for adenopathy.  Psychiatric/Behavioral: Positive for sleep disturbance. Negative for confusion.  Rest see pertinent positives and negatives per HPI.   Current Outpatient Medications on File Prior to Visit  Medication Sig Dispense Refill  . butalbital-aspirin-caffeine (FIORINAL) 50-325-40 MG capsule Take by mouth.    . Iron-FA-B Cmp-C-Biot-Probiotic (FUSION PLUS) CAPS Take 1 capsule by mouth daily.  3  . tranexamic acid (LYSTEDA) 650 MG TABS tablet Take 1,300 mg by mouth 3 (three) times daily.    . Vitamin D, Ergocalciferol, (DRISDOL) 1.25 MG (50000 UNIT) CAPS capsule Take 50,000 Units by mouth every 7 (seven) days.     No current facility-administered medications on file prior to visit.    Past Medical History:  Diagnosis Date  . Fibroids   . Seasonal allergies    Allergies  Allergen Reactions  . Latex Itching    Family History  Problem Relation Age of Onset  . Cancer Sister 24       breast "onset Late 20's"  . Breast cancer Sister   . Breast cancer Maternal Aunt     Social History   Socioeconomic History  . Marital status: Married    Spouse name: Not  on file  . Number of children: Not on file  . Years of education: Not on file  . Highest education level: Not on file  Occupational History  . Not on file  Tobacco Use  . Smoking status: Never Smoker  .  Smokeless tobacco: Never Used  Substance and Sexual Activity  . Alcohol use: No  . Drug use: No  . Sexual activity: Yes    Partners: Male    Birth control/protection: Surgical    Comment: BTL   Other Topics Concern  . Not on file  Social History Narrative  . Not on file   Social Determinants of Health   Financial Resource Strain:   . Difficulty of Paying Living Expenses: Not on file  Food Insecurity:   . Worried About Charity fundraiser in the Last Year: Not on file  . Ran Out of Food in the Last Year: Not on file  Transportation Needs:   . Lack of Transportation (Medical): Not on file  . Lack of Transportation (Non-Medical): Not on file  Physical Activity:   . Days of Exercise per Week: Not on file  . Minutes of Exercise per Session: Not on file  Stress:   . Feeling of Stress : Not on file  Social Connections:   . Frequency of Communication with Friends and Family: Not on file  . Frequency of Social Gatherings with Friends and Family: Not on file  . Attends Religious Services: Not on file  . Active Member of Clubs or Organizations: Not on file  . Attends Archivist Meetings: Not on file  . Marital Status: Not on file    Vitals:   07/16/19 0941  BP: 122/70  Pulse: 71  Temp: (!) 96.3 F (35.7 C)  SpO2: 97%   Body mass index is 25.66 kg/m.  Physical Exam  Nursing note and vitals reviewed. Constitutional: She is oriented to person, place, and time. She appears well-developed and well-nourished. She does not appear ill. No distress.  HENT:  Head: Normocephalic and atraumatic.  Right Ear: External ear normal.  Left Ear: External ear normal.  Nose: Rhinorrhea present. Right sinus exhibits no maxillary sinus tenderness and no frontal sinus tenderness. Left sinus exhibits no maxillary sinus tenderness and no frontal sinus tenderness.  Mouth/Throat: Oropharynx is clear and moist and mucous membranes are normal.  Cerumen impaction bilateral, not able to see  TM's. Hypertrophic turbinates. Nasal voice.  Eyes: Pupils are equal, round, and reactive to light. Conjunctivae are normal.  Neck: No tracheal deviation present. Thyromegaly present. No thyroid mass present.  Cardiovascular: Normal rate and regular rhythm.  No murmur heard. Pulses:      Dorsalis pedis pulses are 2+ on the right side and 2+ on the left side.  Respiratory: Effort normal and breath sounds normal. No respiratory distress.  GI: Soft. She exhibits no mass. There is no hepatomegaly. There is no abdominal tenderness.  Musculoskeletal:        General: No edema.  Lymphadenopathy:    She has no cervical adenopathy.  Neurological: She is alert and oriented to person, place, and time. She has normal strength. No cranial nerve deficit. Gait normal.  Skin: Skin is warm. No rash noted. No erythema.  Psychiatric: She has a normal mood and affect.  Well groomed, good eye contact.   ASSESSMENT AND PLAN:  Ms. Airabella was seen today for establish care.  Diagnoses and all orders for this visit:  Orders Placed This Encounter  Procedures  .  US THYROID  . CBC with Differential/Platelet  . VITAMIN D 25 Hydroxy (Vit-D Deficiency, Fractures)  . TSH  . Lipid panel  . Ferritin  . Basic metabolic panel   Enlarged thyroid gland Further recommendations will be given according to labs/imaging results.  Iron deficiency anemia due to chronic blood loss Continue iron supplementation. Following with gyn for DUB, which she reports as improved. We may need to consider hematology referral if numbers do not improve.  Headache, unspecified headache type Improved. Hx is not the typical for migraine headache. ? Tension headache and/or allergies. She would like to continue Fiorinal because she does not take it frequent and it helps.Some side effects discussed.  Non-seasonal allergic rhinitis due to pollen Problem is not well controlled. Nasonex nasal spray added today. Nasal saline  irrigations as needed. Continue Xyzal 5 mg daily.  -     mometasone (NASONEX) 50 MCG/ACT nasal spray; Place 2 sprays into the nose daily. -     levocetirizine (XYZAL) 5 MG tablet; Take 1 tablet (5 mg total) by mouth every evening.  Vitamin D deficiency, unspecified Continue Ergocalciferol 50,000 U weekly. Will follow labs and will give further recommendations accordingly.  Hyperlipidemia, unspecified hyperlipidemia type Continue low fat diet. Labs will be done next week and recommendations will be given according to 10 years CVD risk score and FLP results.   We dos not have lab service today, gave the option to go to [place] but she prefers to come back next week for fasting labs.   Return in about 6 weeks (around 08/27/2019) for Lab appt. F/u in 6 weeks.   Marquize Seib G. Martinique, MD  [facility]. [place] office.

## 2019-07-16 NOTE — Patient Instructions (Addendum)
A few things to remember from today's visit:   Enlarged thyroid gland - Plan: US THYROID, TSH  Iron deficiency anemia due to chronic blood loss - Plan: CBC with Differential/Platelet, Ferritin  Headache, unspecified headache type  Non-seasonal allergic rhinitis due to pollen - Plan: mometasone (NASONEX) 50 MCG/ACT nasal spray, levocetirizine (XYZAL) 5 MG tablet  Vitamin D deficiency, unspecified - Plan: Basic metabolic panel, VITAMIN D 25 Hydroxy (Vit-D Deficiency, Fractures)  Hyperlipidemia, unspecified hyperlipidemia type - Plan: Basic metabolic panel, Lipid panel  Nasonex nasal spray added today. Continue nasal saline irrigations as needed.  Please be sure medication list is accurate. If a new problem present, please set up appointment sooner than planned today.

## 2019-07-30 ENCOUNTER — Other Ambulatory Visit: Payer: Self-pay

## 2019-07-31 ENCOUNTER — Telehealth: Payer: Self-pay | Admitting: Family Medicine

## 2019-07-31 ENCOUNTER — Other Ambulatory Visit (INDEPENDENT_AMBULATORY_CARE_PROVIDER_SITE_OTHER): Payer: 59

## 2019-07-31 DIAGNOSIS — E559 Vitamin D deficiency, unspecified: Secondary | ICD-10-CM

## 2019-07-31 DIAGNOSIS — D500 Iron deficiency anemia secondary to blood loss (chronic): Secondary | ICD-10-CM

## 2019-07-31 DIAGNOSIS — E785 Hyperlipidemia, unspecified: Secondary | ICD-10-CM

## 2019-07-31 DIAGNOSIS — E049 Nontoxic goiter, unspecified: Secondary | ICD-10-CM

## 2019-07-31 NOTE — Telephone Encounter (Signed)
Pt has questions regarding her last visit about -cholesterol  -medication   (pt did not want to give me more info than that) Pt can reached at [phone]

## 2019-07-31 NOTE — Telephone Encounter (Signed)
Will wait for pt's lab results to come back, they were drawn this morning.

## 2019-08-01 ENCOUNTER — Telehealth: Payer: Self-pay | Admitting: Family Medicine

## 2019-08-01 DIAGNOSIS — J301 Allergic rhinitis due to pollen: Secondary | ICD-10-CM

## 2019-08-01 LAB — LIPID PANEL
Cholesterol: 194 mg/dL (ref <200)
HDL: 67 mg/dL (ref >=50)
LDL Cholesterol (Calc): 113 mg/dL (calc) — ABNORMAL HIGH
Non-HDL Cholesterol (Calc): 127 mg/dL (calc) (ref <130)
Total CHOL/HDL Ratio: 2.9 (calc) (ref <5.0)
Triglycerides: 56 mg/dL (ref <150)

## 2019-08-01 LAB — CBC WITH DIFFERENTIAL/PLATELET
Absolute Monocytes: 419 cells/uL (ref 200–950)
Basophils Absolute: 27 cells/uL (ref 0–200)
Basophils Relative: 0.3 %
Eosinophils Absolute: 73 cells/uL (ref 15–500)
Eosinophils Relative: 0.8 %
HCT: 39.2 % (ref 35.0–45.0)
Hemoglobin: 12.3 g/dL (ref 11.7–15.5)
Lymphs Abs: 1820 cells/uL (ref 850–3900)
MCH: 27.8 pg (ref 27.0–33.0)
MCHC: 31.4 g/dL — ABNORMAL LOW (ref 32.0–36.0)
MCV: 88.7 fL (ref 80.0–100.0)
MPV: 12.5 fL (ref 7.5–12.5)
Monocytes Relative: 4.6 %
Neutro Abs: 6761 cells/uL (ref 1500–7800)
Neutrophils Relative %: 74.3 %
Platelets: 178 10*3/uL (ref 140–400)
RBC: 4.42 10*6/uL (ref 3.80–5.10)
RDW: 14 % (ref 11.0–15.0)
Total Lymphocyte: 20 %
WBC: 9.1 10*3/uL (ref 3.8–10.8)

## 2019-08-01 LAB — BASIC METABOLIC PANEL
BUN: 11 mg/dL (ref 7–25)
CO2: 28 mmol/L (ref 20–32)
Calcium: 9.9 mg/dL (ref 8.6–10.2)
Chloride: 102 mmol/L (ref 98–110)
Creat: 0.78 mg/dL (ref 0.50–1.10)
Glucose, Bld: 105 mg/dL — ABNORMAL HIGH (ref 65–99)
Potassium: 3.9 mmol/L (ref 3.5–5.3)
Sodium: 138 mmol/L (ref 135–146)

## 2019-08-01 LAB — FERRITIN: Ferritin: 8 ng/mL — ABNORMAL LOW (ref 16–232)

## 2019-08-01 LAB — TSH: TSH: 1.27 mIU/L

## 2019-08-01 LAB — VITAMIN D 25 HYDROXY (VIT D DEFICIENCY, FRACTURES): Vit D, 25-Hydroxy: 66 ng/mL (ref 30–100)

## 2019-08-01 NOTE — Telephone Encounter (Signed)
Pharmacy told pt that they have not received prescription for nasonex (nasal spray). She is requesting it be sent over again.

## 2019-08-02 MED ORDER — MOMETASONE FUROATE 50 MCG/ACT NA SUSP: 2.0000 | Freq: Every day | NASAL | 3 refills | Status: DC

## 2019-08-02 NOTE — Telephone Encounter (Signed)
I tried contacting pt, unable to leave a voicemail due to mailbox being full. Waiting for labs to be reviewed by Dr. Martinique.

## 2019-08-02 NOTE — Telephone Encounter (Signed)
Rx resent.

## 2019-08-05 ENCOUNTER — Other Ambulatory Visit: Payer: Self-pay

## 2019-08-05 DIAGNOSIS — J301 Allergic rhinitis due to pollen: Secondary | ICD-10-CM

## 2019-08-05 MED ORDER — MOMETASONE FUROATE 50 MCG/ACT NA SUSP: 2.0000 | Freq: Every day | NASAL | 3 refills | Status: DC

## 2019-08-13 ENCOUNTER — Encounter: Payer: Self-pay | Admitting: Family Medicine

## 2019-08-13 ENCOUNTER — Other Ambulatory Visit: Payer: Self-pay | Admitting: Family Medicine

## 2019-08-13 DIAGNOSIS — D500 Iron deficiency anemia secondary to blood loss (chronic): Secondary | ICD-10-CM

## 2019-08-13 MED ORDER — FERROUS SULFATE 325 (65 FE) MG PO TABS: 325.0000 mg | ORAL_TABLET | Freq: Every day | ORAL | 3 refills | Status: DC

## 2019-08-14 ENCOUNTER — Other Ambulatory Visit: Payer: Self-pay | Admitting: Family Medicine

## 2019-08-14 DIAGNOSIS — E049 Nontoxic goiter, unspecified: Secondary | ICD-10-CM

## 2019-08-20 ENCOUNTER — Other Ambulatory Visit: Payer: Self-pay

## 2019-08-20 ENCOUNTER — Other Ambulatory Visit: Payer: Self-pay | Admitting: Obstetrics and Gynecology

## 2019-08-20 DIAGNOSIS — Z1231 Encounter for screening mammogram for malignant neoplasm of breast: Secondary | ICD-10-CM

## 2019-08-20 MED ORDER — VITAMIN D (ERGOCALCIFEROL) 1.25 MG (50000 UNIT) PO CAPS: 50000.0000 [IU] | ORAL_CAPSULE | ORAL | 3 refills | Status: DC

## 2019-08-22 ENCOUNTER — Ambulatory Visit
Admission: RE | Admit: 2019-08-22 | Discharge: 2019-08-22 | Disposition: A | Discharge status: Home / Self Care | Payer: 59 | Source: Ambulatory Visit | Attending: Family Medicine | Admitting: Family Medicine

## 2019-08-22 DIAGNOSIS — E049 Nontoxic goiter, unspecified: Secondary | ICD-10-CM

## 2019-08-27 ENCOUNTER — Encounter: Payer: Self-pay | Admitting: Family Medicine

## 2019-09-02 ENCOUNTER — Telehealth: Payer: Self-pay | Admitting: Family Medicine

## 2019-09-02 DIAGNOSIS — E049 Nontoxic goiter, unspecified: Secondary | ICD-10-CM

## 2019-09-02 NOTE — Telephone Encounter (Signed)
Pt done her ultra-sound for her thyroid and wondering what the next step is?  Pt can be reached at [phone] -ok to leave a detailed message per pt

## 2019-09-03 NOTE — Telephone Encounter (Signed)
I spoke with patient. We went over her ultrasound results. She stated that her older sister had her thyroid removed because the biopsy came back as cancerous. Patient wants to know if she needs any further testing at this time or if we just continue to monitor for now?

## 2019-09-06 NOTE — Telephone Encounter (Signed)
I called and spoke with patient. We went over the information below. She will talk with her sister and decide what to do. She will call us back next week with her decision.

## 2019-09-06 NOTE — Telephone Encounter (Signed)
We could arrange appointment with endocrinologist or plan on repeating ultrasound in 6 to 12 months. Thanks, BJ

## 2019-09-11 NOTE — Addendum Note (Signed)
Addended by: Rodrigo Ran on: 09/11/2019 04:21 PM   Modules accepted: Orders

## 2019-09-11 NOTE — Telephone Encounter (Signed)
I called and spoke with patient. She would like to move forward with Endo referral. Referral has been placed and patient is aware that they will contact her to set up an appointment.

## 2019-09-11 NOTE — Telephone Encounter (Signed)
Pt is requesting to speak to you regarding the conversation you both had on 09/06/19. Thanks

## 2019-09-17 ENCOUNTER — Other Ambulatory Visit: Payer: Self-pay

## 2019-09-17 ENCOUNTER — Ambulatory Visit
Admission: RE | Admit: 2019-09-17 | Discharge: 2019-09-17 | Disposition: A | Discharge status: Home / Self Care | Payer: 59 | Source: Ambulatory Visit | Attending: Obstetrics and Gynecology | Admitting: Obstetrics and Gynecology

## 2019-09-17 DIAGNOSIS — Z1231 Encounter for screening mammogram for malignant neoplasm of breast: Secondary | ICD-10-CM

## 2019-10-08 ENCOUNTER — Other Ambulatory Visit: Payer: Self-pay

## 2019-10-10 ENCOUNTER — Ambulatory Visit: Payer: 59 | Admitting: Endocrinology

## 2019-10-10 ENCOUNTER — Other Ambulatory Visit: Payer: Self-pay

## 2019-10-10 ENCOUNTER — Encounter: Payer: Self-pay | Admitting: Endocrinology

## 2019-10-10 DIAGNOSIS — E041 Nontoxic single thyroid nodule: Secondary | ICD-10-CM | POA: Insufficient documentation

## 2019-10-10 NOTE — Progress Notes (Signed)
Subjective:    Patient ID: Dawn Bates, female    DOB: 15-Apr-1971, 49 y.o.   MRN: [id number]  HPI Pt is referred by Dr Martinique, for nodular thyroid.  Pt was noted to have a nodule at the thyroid in early 2021.  she is unaware of ever having had thyroid problems in the past.  she has no h/o XRT or surgery to the neck.   Past Medical History:  Diagnosis Date  . Fibroids   . Seasonal allergies     Past Surgical History:  Procedure Laterality Date  . TUBAL LIGATION      Social History   Socioeconomic History  . Marital status: Married    Spouse name: Not on file  . Number of children: Not on file  . Years of education: Not on file  . Highest education level: Not on file  Occupational History  . Not on file  Tobacco Use  . Smoking status: Never Smoker  . Smokeless tobacco: Never Used  Substance and Sexual Activity  . Alcohol use: No  . Drug use: No  . Sexual activity: Yes    Partners: Male    Birth control/protection: Surgical    Comment: BTL   Other Topics Concern  . Not on file  Social History Narrative  . Not on file   Social Determinants of Health   Financial Resource Strain:   . Difficulty of Paying Living Expenses:   Food Insecurity:   . Worried About Charity fundraiser in the Last Year:   . Arboriculturist in the Last Year:   Transportation Needs:   . Film/video editor (Medical):   Marland Kitchen Lack of Transportation (Non-Medical):   Physical Activity:   . Days of Exercise per Week:   . Minutes of Exercise per Session:   Stress:   . Feeling of Stress :   Social Connections:   . Frequency of Communication with Friends and Family:   . Frequency of Social Gatherings with Friends and Family:   . Attends Religious Services:   . Active Member of Clubs or Organizations:   . Attends Archivist Meetings:   Marland Kitchen Marital Status:   Intimate Partner Violence:   . Fear of Current or Ex-Partner:   . Emotionally Abused:   Marland Kitchen Physically Abused:   .  Sexually Abused:     Current Outpatient Medications on File Prior to Visit  Medication Sig Dispense Refill  . BIOTIN PO Take 1 capsule by mouth daily.    . ferrous sulfate 325 (65 FE) MG tablet Take 1 tablet (325 mg total) by mouth daily. 90 tablet 3  . Iron-FA-B Cmp-C-Biot-Probiotic (FUSION PLUS) CAPS Take 1 capsule by mouth daily.  3  . levocetirizine (XYZAL) 5 MG tablet Take 1 tablet (5 mg total) by mouth every evening. 90 tablet 2  . mometasone (NASONEX) 50 MCG/ACT nasal spray Place 2 sprays into the nose daily. (Patient taking differently: Place 2 sprays into the nose as needed. ) 17 g 3  . tranexamic acid (LYSTEDA) 650 MG TABS tablet Take 1,300 mg by mouth 3 (three) times daily.    . Vitamin D, Ergocalciferol, (DRISDOL) 1.25 MG (50000 UNIT) CAPS capsule Take 1 capsule (50,000 Units total) by mouth every 7 (seven) days. 12 capsule 3   No current facility-administered medications on file prior to visit.    Allergies  Allergen Reactions  . Latex Itching    Family History  Problem Relation Age of Onset  .  Cancer Sister 76       breast "onset Late 20's"  . Breast cancer Sister   . Thyroid cancer Sister        pt does not know cell type  . Breast cancer Maternal Aunt     BP 138/80   Pulse 68   Ht 5\' 6"  (1.676 m)   Wt 162 lb (73.5 kg)   SpO2 98%   BMI 26.15 kg/m    Review of Systems Denies hoarseness, neck pain, sob, cough, dysphagia, diarrhea, flushing.  She reports cold intolerance and nasal congestion.  She has gained a few lbs.      Objective:   Physical Exam VS: see vs page GEN: no distress HEAD: head: no deformity eyes: no periorbital swelling, no proptosis external nose and ears are normal NECK: thyroid is slightly enlarged (R>L) CHEST WALL: no deformity LUNGS: clear to auscultation CV: reg rate and rhythm, no murmur.  MUSCULOSKELETAL: muscle bulk and strength are grossly normal.  no obvious joint swelling.  gait is normal and steady EXTEMITIES: no  deformity.  no edema PULSES: no carotid bruit NEURO:  cn 2-12 grossly intact.   readily moves all 4's.  sensation is intact to touch on all 4's.  No tremor SKIN:  Normal texture and temperature.  No rash or suspicious lesion is visible.  Not diaphoretic.   NODES:  None palpable at the neck PSYCH: alert, well-oriented.  Does not appear anxious nor depressed.    [place]: Normal sized though moderately heterogeneous appearing and potentially hyperemic thyroid gland, nonspecific though could be seen in the setting of a thyroiditis. 2. Solitary punctate left-sided thyroid nodule does not meet imaging criteria to recommend percutaneous sampling or continued dedicated follow-up.  Lab Results  Component Value Date   TSH 1.27 07/31/2019   I have reviewed outside records, and summarized: Pt was noted to have thyroid nodule, and referred here. Anemia, headache, and dyslipidemia were also addressed.      Assessment & Plan:  Thyroid nodule, new to me. Low risk of malignancy Pos FHX, new to me: check calcitonin  Patient Instructions  Blood tests are requested for you today.  We'll let you know about the results.   You should have the thyroid examined once a year.  Either Dr Martinique or I would be happy to do that.  You should have the ultrasound checked as needed.  I would be happy to see you back here as needed.

## 2019-10-10 NOTE — Patient Instructions (Signed)
Blood tests are requested for you today.  We'll let you know about the results.   You should have the thyroid examined once a year.  Either Dr Martinique or I would be happy to do that.  You should have the ultrasound checked as needed.  I would be happy to see you back here as needed.

## 2019-10-13 LAB — CALCITONIN: Calcitonin: <2 pg/mL (ref <=5)

## 2019-12-13 ENCOUNTER — Other Ambulatory Visit: Payer: Self-pay | Admitting: Obstetrics and Gynecology

## 2020-05-21 ENCOUNTER — Other Ambulatory Visit: Payer: Self-pay

## 2020-05-21 ENCOUNTER — Ambulatory Visit: Payer: 59 | Admitting: Family Medicine

## 2020-05-21 ENCOUNTER — Encounter: Payer: Self-pay | Admitting: Family Medicine

## 2020-05-21 VITALS — BP 130/70 | HR 97 | Temp 98.6°F | Wt 154.0 lb

## 2020-05-21 DIAGNOSIS — R600 Localized edema: Secondary | ICD-10-CM

## 2020-05-21 DIAGNOSIS — L299 Pruritus, unspecified: Secondary | ICD-10-CM

## 2020-05-21 DIAGNOSIS — R0602 Shortness of breath: Secondary | ICD-10-CM

## 2020-05-21 DIAGNOSIS — R5383 Other fatigue: Secondary | ICD-10-CM | POA: Diagnosis not present

## 2020-05-21 NOTE — Patient Instructions (Addendum)
Peripheral Edema  Peripheral edema is swelling that is caused by a buildup of fluid. Peripheral edema most often affects the lower legs, ankles, and feet. It can also develop in the arms, hands, and face. The area of the body that has peripheral edema will look swollen. It may also feel heavy or warm. Your clothes may start to feel tight. Pressing on the area may make a temporary dent in your skin. You may not be able to move your swollen arm or leg as much as usual. There are many causes of peripheral edema. It can happen because of a complication of other conditions such as congestive heart failure, kidney disease, or a problem with your blood circulation. It also can be a side effect of certain medicines or because of an infection. It often happens to women during pregnancy. Sometimes, the cause is not known. Follow these instructions at home: Managing pain, stiffness, and swelling   Raise (elevate) your legs while you are sitting or lying down.  Move around often to prevent stiffness and to lessen swelling.  Do not sit or stand for long periods of time.  Wear support stockings as told by your health care provider. Medicines  Take over-the-counter and prescription medicines only as told by your health care provider.  Your health care provider may prescribe medicine to help your body get rid of excess water (diuretic). General instructions  Pay attention to any changes in your symptoms.  Follow instructions from your health care provider about limiting salt (sodium) in your diet. Sometimes, eating less salt may reduce swelling.  Moisturize skin daily to help prevent skin from cracking and draining.  Keep all follow-up visits as told by your health care provider. This is important. Contact a health care provider if you have:  A fever.  Edema that starts suddenly or is getting worse, especially if you are pregnant or have a medical condition.  Swelling in only one  leg.  Increased swelling, redness, or pain in one or both of your legs.  Drainage or sores at the area where you have edema. Get help right away if you:  Develop shortness of breath, especially when you are lying down.  Have pain in your chest or abdomen.  Feel weak.  Feel faint. Summary  Peripheral edema is swelling that is caused by a buildup of fluid. Peripheral edema most often affects the lower legs, ankles, and feet.  Move around often to prevent stiffness and to lessen swelling. Do not sit or stand for long periods of time.  Pay attention to any changes in your symptoms.  Contact a health care provider if you have edema that starts suddenly or is getting worse, especially if you are pregnant or have a medical condition.  Get help right away if you develop shortness of breath, especially when lying down. This information is not intended to replace advice given to you by your health care provider. Make sure you discuss any questions you have with your health care provider. Document Revised: 02/28/2018 Document Reviewed: 02/28/2018 Elsevier Patient Education  [place] of Breath, Adult Shortness of breath is when a person has trouble breathing enough air or when a person feels like she or he is having trouble breathing in enough air. Shortness of breath could be a sign of a medical problem. Follow these instructions at home:   Pay attention to any changes in your symptoms.  Do not use any products that contain nicotine or tobacco, such  as cigarettes, e-cigarettes, and chewing tobacco.  Do not smoke. Smoking is a common cause of shortness of breath. If you need help quitting, ask your health care provider.  Avoid things that can irritate your airways, such as: ? Mold. ? Dust. ? Air pollution. ? Chemical fumes. ? Things that can cause allergy symptoms (allergens), if you have allergies.  Keep your living space clean and free of mold and  dust.  Rest as needed. Slowly return to your usual activities.  Take over-the-counter and prescription medicines only as told by your health care provider. This includes oxygen therapy and inhaled medicines.  Keep all follow-up visits as told by your health care provider. This is important. Contact a health care provider if:  Your condition does not improve as soon as expected.  You have a hard time doing your normal activities, even after you rest.  You have new symptoms. Get help right away if:  Your shortness of breath gets worse.  You have shortness of breath when you are resting.  You feel light-headed or you faint.  You have a cough that is not controlled with medicines.  You cough up blood.  You have pain with breathing.  You have pain in your chest, arms, shoulders, or abdomen.  You have a fever.  You cannot walk up stairs or exercise the way that you normally do. These symptoms may represent a serious problem that is an emergency. Do not wait to see if the symptoms will go away. Get medical help right away. Call your local emergency services (911 in the [place] Do not drive yourself to the hospital. Summary  Shortness of breath is when a person has trouble breathing enough air. It can be a sign of a medical problem.  Avoid things that irritate your lungs, such as smoking, pollution, mold, and dust.  Pay attention to changes in your symptoms and contact your health care provider if you have a hard time completing daily activities because of shortness of breath. This information is not intended to replace advice given to you by your health care provider. Make sure you discuss any questions you have with your health care provider. Document Revised: 11/06/2017 Document Reviewed: 11/06/2017 Elsevier Patient Education  [place].  Pruritus Pruritus is an itchy feeling on the skin. One of the most common causes is dry skin, but many different things can cause  itching. Most cases of itching do not require medical attention. Sometimes itchy skin can turn into a rash. Follow these instructions at home: Skin care   Apply moisturizing lotion to your skin as needed. Lotion that contains petroleum jelly is best.  Take medicines or apply medicated creams only as told by your health care provider. This may include: ? Corticosteroid cream. ? Anti-itch lotions. ? Oral antihistamines.  Apply a cool, wet cloth (cool compress) to the affected areas.  Take baths with one of the following: ? Epsom salts. You can get these at your local pharmacy or grocery store. Follow the instructions on the packaging. ? Baking soda. Pour a small amount into the bath as told by your health care provider. ? Colloidal oatmeal. You can get this at your local pharmacy or grocery store. Follow the instructions on the packaging.  Apply baking soda paste to your skin. To make the paste, stir water into a small amount of baking soda until it reaches a paste-like consistency.  Do not scratch your skin.  Do not take hot showers or baths, which  can make itching worse. A cool shower may help with itching as long as you apply moisturizing lotion after the shower.  Do not use scented soaps, detergents, perfumes, and cosmetic products. Instead, use gentle, unscented versions of these items. General instructions  Avoid wearing tight clothes.  Keep a journal to help find out what is causing your itching. Write down: ? What you eat and drink. ? What cosmetic products you use. ? What soaps or detergents you use. ? What you wear, including jewelry.  Use a humidifier. This keeps the air moist, which helps to prevent dry skin.  Be aware of any changes in your itchiness. Contact a health care provider if:  The itching does not go away after several days.  You are unusually thirsty or urinating more than normal.  Your skin tingles or feels numb.  Your skin or the white parts of  your eyes turn yellow (jaundice).  You feel weak.  You have any of the following: ? Night sweats. ? Tiredness (fatigue). ? Weight loss. ? Abdominal pain. Summary  Pruritus is an itchy feeling on the skin. One of the most common causes is dry skin, but many different conditions and factors can cause itching.  Apply moisturizing lotion to your skin as needed. Lotion that contains petroleum jelly is best.  Take medicines or apply medicated creams only as told by your health care provider.  Do not take hot showers or baths. Do not use scented soaps, detergents, perfumes, or cosmetic products. This information is not intended to replace advice given to you by your health care provider. Make sure you discuss any questions you have with your health care provider. Document Revised: 06/20/2017 Document Reviewed: 06/20/2017 Elsevier Patient Education  [place].  Fatigue If you have fatigue, you feel tired all the time and have a lack of energy or a lack of motivation. Fatigue may make it difficult to start or complete tasks because of exhaustion. In general, occasional or mild fatigue is often a normal response to activity or life. However, long-lasting (chronic) or extreme fatigue may be a symptom of a medical condition. Follow these instructions at home: General instructions  Watch your fatigue for any changes.  Go to bed and get up at the same time every day.  Avoid fatigue by pacing yourself during the day and getting enough sleep at night.  Maintain a healthy weight. Medicines  Take over-the-counter and prescription medicines only as told by your health care provider.  Take a multivitamin, if told by your health care provider.  Do not use herbal or dietary supplements unless they are approved by your health care provider. Activity   Exercise regularly, as told by your health care provider.  Use or practice techniques to help you relax, such as yoga, tai chi,  meditation, or massage therapy. Eating and drinking   Avoid heavy meals in the evening.  Eat a well-balanced diet, which includes lean proteins, whole grains, plenty of fruits and vegetables, and low-fat dairy products.  Avoid consuming too much caffeine.  Avoid the use of alcohol.  Drink enough fluid to keep your urine pale yellow. Lifestyle  Change situations that cause you stress. Try to keep your work and personal schedule in balance.  Do not use any products that contain nicotine or tobacco, such as cigarettes and e-cigarettes. If you need help quitting, ask your health care provider.  Do not use drugs. Contact a health care provider if:  Your fatigue does not get  better.  You have a fever.  You suddenly lose or gain weight.  You have headaches.  You have trouble falling asleep or sleeping through the night.  You feel angry, guilty, anxious, or sad.  You are unable to have a bowel movement (constipation).  Your skin is dry.  You have swelling in your legs or another part of your body. Get help right away if:  You feel confused.  Your vision is blurry.  You feel faint or you pass out.  You have a severe headache.  You have severe pain in your abdomen, your back, or the area between your waist and hips (pelvis).  You have chest pain, shortness of breath, or an irregular or fast heartbeat.  You are unable to urinate, or you urinate less than normal.  You have abnormal bleeding, such as bleeding from the rectum, vagina, nose, lungs, or nipples.  You vomit blood.  You have thoughts about hurting yourself or others. If you ever feel like you may hurt yourself or others, or have thoughts about taking your own life, get help right away. You can go to your nearest emergency department or call:  Your local emergency services (911 in the [place]  A suicide crisis helpline, such as the [facility] at 770-564-7130. This is open 24  hours a day. Summary  If you have fatigue, you feel tired all the time and have a lack of energy or a lack of motivation.  Fatigue may make it difficult to start or complete tasks because of exhaustion.  Long-lasting (chronic) or extreme fatigue may be a symptom of a medical condition.  Exercise regularly, as told by your health care provider.  Change situations that cause you stress. Try to keep your work and personal schedule in balance. This information is not intended to replace advice given to you by your health care provider. Make sure you discuss any questions you have with your health care provider. Document Revised: 12/26/2018 Document Reviewed: 03/01/2017 Elsevier Patient Education  2020 [employer].

## 2020-05-21 NOTE — Progress Notes (Signed)
Subjective:    Patient ID: Dawn Bates, female    DOB: 07-29-1970, 49 y.o.   MRN: [id number]  No chief complaint on file. Patient is accompanied by her husband.   HPI Patient is a 49 yo female with pmf sig for allergies, thyroid nodule, anemia, Vit D def, and HLD who is followed by Dr. Martinique and seen today for ongoing concerns.  Patient endorses lower extremity edema and fatigue on and off x1 year.  Most recent episode started last Wednesday after patient drove with her husband to [place].  Patient notes swelling became worse last night decreased some today.  Patient sleeping with legs elevated to help.  Patient notes 3-4 pillow orthopnea x1 year and SOB with walking short distances.  Patient thought Nasonex could be causing her edema so she stopped taking it 2 weeks ago.  Patient also notes pruritus.  Was using arm and Hammer sensitive detergent switch to arm and Hammer white after pruritus started.  Patient denies rash.  Pt's husband thinks her neck is swollen.  Per chart review had thyroid ultrasound which revealed a nodule.  Pt does not recall this.  Pt's husband was unaware.  Past Medical History:  Diagnosis Date  . Fibroids   . Seasonal allergies     Allergies  Allergen Reactions  . Latex Itching    ROS General: Denies fever, chills, night sweats, changes in weight, changes in appetite  +fatigue HEENT: Denies headaches, ear pain, changes in vision, rhinorrhea, sore throat  +neck enlargement CV: Denies CP, palpitations +SOB, orthopnea Pulm: Denies SOB, cough, wheezing GI: Denies abdominal pain, nausea, vomiting, diarrhea, constipation GU: Denies dysuria, hematuria, frequency, vaginal discharge Msk: Denies muscle cramps, joint pains  +LE edema Neuro: Denies weakness, numbness, tingling Skin: Denies rashes, bruising Psych: Denies depression, anxiety, hallucinations     Objective:    Blood pressure 130/70, pulse 97, temperature 98.6 F (37 C), temperature source Oral,  weight 154 lb (69.9 kg), SpO2 97 %.  Gen. Pleasant, well-nourished, in no distress, tearful HEENT: [place]/AT, face symmetric, conjunctiva clear, no scleral icterus, PERRLA, EOMI, nares patent without drainage. Neck: No JVD, mild thyromegaly, no carotid bruits Lungs: no accessory muscle use, faint bibasilar crackles Cardiovascular: RRR, no m/r/g, R LE with trace pitting edema to shin.  LLE with trace edema.  Negative Homans' sign b/l.  DP and PT pulses 2+ bilaterally Musculoskeletal: No deformities, no cyanosis or clubbing, normal tone Neuro:  A&Ox3, CN II-XII intact, normal gait Skin:  Warm, no lesions/ rash   Wt Readings from Last 3 Encounters:  05/21/20 154 lb (69.9 kg)  10/10/19 162 lb (73.5 kg)  07/16/19 159 lb (72.1 kg)    Lab Results  Component Value Date   WBC 9.1 07/31/2019   HGB 12.3 07/31/2019   HCT 39.2 07/31/2019   PLT 178 07/31/2019   GLUCOSE 105 (H) 07/31/2019   CHOL 194 07/31/2019   TRIG 56 07/31/2019   HDL 67 07/31/2019   LDLCALC 113 (H) 07/31/2019   NA 138 07/31/2019   K 3.9 07/31/2019   CL 102 07/31/2019   CREATININE 0.78 07/31/2019   BUN 11 07/31/2019   CO2 28 07/31/2019   TSH 1.27 07/31/2019    Assessment/Plan:  Bilateral lower extremity edema  -Discussed possible causes including CHF exacerbation, dependent edema, liver dysfunction, thyroid dysfunction -We will obtain labs -Patient is to elevate lower extremities, decrease sodium intake, consider compression socks or TED hose. - Plan: CMP with eGFR([facility]), Brain Natriuretic Peptide, CBC with Differential/Platelet, TSH,  T4, DG Chest 2 View, EKG 12-Lead  SOB (shortness of breath)  -Discussed possible causes including CHF exacerbation, arrhythmia, anemia, thyroid dysfunction, reactive airway disease -We will obtain labs -CXR ordered.  Patient return morning to have study done -Given precautions -Further recommendations based on pending results - Plan: CBC with Differential/Platelet, Brain Natriuretic  Peptide, DG Chest 2 View, EKG 12-Lead  Fatigue, unspecified type -Consider possible causes including anemia, thyroid dysfunction, CHF exacerbation -further recommendations based on lab results. - Plan: CBC with Differential/Platelet, TSH, T4, free, Hemoglobin A1c, DG Chest 2 View, EKG 12-Lead  Pruritus -Discussed possible causes including dry skin due to weather.  Also considered thyroid dysfunction and liver dysfunction. -Discussed using gentle soaps, lotions, detergents - Plan: Brain Natriuretic Peptide, CMP, TSH, free T4  F/u in 4 days with pcp or proceed to the nearest ED for worsened symptoms  Grier Mitts, MD

## 2020-05-22 ENCOUNTER — Telehealth: Payer: Self-pay | Admitting: Family Medicine

## 2020-05-22 ENCOUNTER — Ambulatory Visit (INDEPENDENT_AMBULATORY_CARE_PROVIDER_SITE_OTHER): Payer: 59

## 2020-05-22 ENCOUNTER — Other Ambulatory Visit: Payer: 59

## 2020-05-22 ENCOUNTER — Other Ambulatory Visit: Payer: Self-pay

## 2020-05-22 ENCOUNTER — Other Ambulatory Visit: Payer: Self-pay | Admitting: Family Medicine

## 2020-05-22 DIAGNOSIS — R5383 Other fatigue: Secondary | ICD-10-CM

## 2020-05-22 DIAGNOSIS — R0602 Shortness of breath: Secondary | ICD-10-CM

## 2020-05-22 DIAGNOSIS — R600 Localized edema: Secondary | ICD-10-CM | POA: Diagnosis not present

## 2020-05-22 DIAGNOSIS — R7989 Other specified abnormal findings of blood chemistry: Secondary | ICD-10-CM

## 2020-05-22 DIAGNOSIS — E069 Thyroiditis, unspecified: Secondary | ICD-10-CM

## 2020-05-22 LAB — T4, FREE: Free T4: 5.7 ng/dL — ABNORMAL HIGH (ref 0.8–1.8)

## 2020-05-22 LAB — COMPLETE METABOLIC PANEL WITH GFR
AG Ratio: 1.3 (calc) (ref 1.0–2.5)
ALT: 84 U/L — ABNORMAL HIGH (ref 6–29)
AST: 51 U/L — ABNORMAL HIGH (ref 10–35)
Albumin: 3.6 g/dL (ref 3.6–5.1)
Alkaline phosphatase (APISO): 86 U/L (ref 31–125)
BUN: 17 mg/dL (ref 7–25)
CO2: 23 mmol/L (ref 20–32)
Calcium: 9.9 mg/dL (ref 8.6–10.2)
Chloride: 108 mmol/L (ref 98–110)
Creat: 0.51 mg/dL (ref 0.50–1.10)
GFR, Est African American: 133 mL/min/{1.73_m2} (ref >=60)
GFR, Est Non African American: 115 mL/min/{1.73_m2} (ref >=60)
Globulin: 2.7 g/dL (calc) (ref 1.9–3.7)
Glucose, Bld: 99 mg/dL (ref 65–99)
Potassium: 4.4 mmol/L (ref 3.5–5.3)
Sodium: 142 mmol/L (ref 135–146)
Total Bilirubin: 0.6 mg/dL (ref 0.2–1.2)
Total Protein: 6.3 g/dL (ref 6.1–8.1)

## 2020-05-22 LAB — CBC WITH DIFFERENTIAL/PLATELET
Absolute Monocytes: 668 cells/uL (ref 200–950)
Basophils Absolute: 13 cells/uL (ref 0–200)
Basophils Relative: 0.2 %
Eosinophils Absolute: 107 cells/uL (ref 15–500)
Eosinophils Relative: 1.7 %
HCT: 30.8 % — ABNORMAL LOW (ref 35.0–45.0)
Hemoglobin: 9.2 g/dL — ABNORMAL LOW (ref 11.7–15.5)
Lymphs Abs: 1556 cells/uL (ref 850–3900)
MCH: 22.6 pg — ABNORMAL LOW (ref 27.0–33.0)
MCHC: 29.9 g/dL — ABNORMAL LOW (ref 32.0–36.0)
MCV: 75.7 fL — ABNORMAL LOW (ref 80.0–100.0)
MPV: 12.2 fL (ref 7.5–12.5)
Monocytes Relative: 10.6 %
Neutro Abs: 3956 cells/uL (ref 1500–7800)
Neutrophils Relative %: 62.8 %
Platelets: 247 10*3/uL (ref 140–400)
RBC: 4.07 10*6/uL (ref 3.80–5.10)
RDW: 14.6 % (ref 11.0–15.0)
Total Lymphocyte: 24.7 %
WBC: 6.3 10*3/uL (ref 3.8–10.8)

## 2020-05-22 LAB — BRAIN NATRIURETIC PEPTIDE: Brain Natriuretic Peptide: 62 pg/mL (ref <100)

## 2020-05-22 LAB — HEMOGLOBIN A1C
Hgb A1c MFr Bld: 5.5 % of total Hgb (ref <5.7)
Mean Plasma Glucose: 111 (calc)
eAG (mmol/L): 6.2 (calc)

## 2020-05-22 LAB — TSH: TSH: <0.01 mIU/L — ABNORMAL LOW

## 2020-05-22 NOTE — Telephone Encounter (Signed)
Pt was calling back to get her lab results

## 2020-05-26 ENCOUNTER — Other Ambulatory Visit: Payer: Self-pay

## 2020-05-26 DIAGNOSIS — D500 Iron deficiency anemia secondary to blood loss (chronic): Secondary | ICD-10-CM

## 2020-05-26 MED ORDER — FERROUS SULFATE 325 (65 FE) MG PO TABS: 325.0000 mg | ORAL_TABLET | Freq: Every day | ORAL | 1 refills | Status: DC

## 2020-05-26 NOTE — Telephone Encounter (Signed)
Reviewed pt lab result and recommendations, verbalized understanding

## 2020-05-30 ENCOUNTER — Encounter: Payer: Self-pay | Admitting: Family Medicine

## 2020-06-08 ENCOUNTER — Other Ambulatory Visit: Payer: 59

## 2020-06-08 ENCOUNTER — Ambulatory Visit
Admission: RE | Admit: 2020-06-08 | Discharge: 2020-06-08 | Disposition: A | Discharge status: Home / Self Care | Payer: 59 | Source: Ambulatory Visit | Attending: Family Medicine | Admitting: Family Medicine

## 2020-06-08 DIAGNOSIS — R7989 Other specified abnormal findings of blood chemistry: Secondary | ICD-10-CM

## 2020-06-08 DIAGNOSIS — E069 Thyroiditis, unspecified: Secondary | ICD-10-CM

## 2020-06-15 ENCOUNTER — Other Ambulatory Visit: Payer: 59

## 2020-06-17 ENCOUNTER — Ambulatory Visit
Admission: RE | Admit: 2020-06-17 | Discharge: 2020-06-17 | Disposition: A | Discharge status: Home / Self Care | Payer: 59 | Source: Ambulatory Visit | Attending: Family Medicine | Admitting: Family Medicine

## 2020-06-20 DIAGNOSIS — N939 Abnormal uterine and vaginal bleeding, unspecified: Secondary | ICD-10-CM

## 2020-06-20 DIAGNOSIS — E0590 Thyrotoxicosis, unspecified without thyrotoxic crisis or storm: Secondary | ICD-10-CM

## 2020-06-20 HISTORY — DX: Abnormal uterine and vaginal bleeding, unspecified: N93.9

## 2020-06-20 HISTORY — DX: Thyrotoxicosis, unspecified without thyrotoxic crisis or storm: E05.90

## 2020-06-24 ENCOUNTER — Ambulatory Visit (INDEPENDENT_AMBULATORY_CARE_PROVIDER_SITE_OTHER): Payer: 59 | Admitting: Endocrinology

## 2020-06-24 ENCOUNTER — Other Ambulatory Visit: Payer: Self-pay

## 2020-06-24 ENCOUNTER — Encounter: Payer: Self-pay | Admitting: Endocrinology

## 2020-06-24 VITALS — BP 124/60 | HR 90 | Ht 66.0 in | Wt 144.0 lb

## 2020-06-24 DIAGNOSIS — E069 Thyroiditis, unspecified: Secondary | ICD-10-CM

## 2020-06-24 LAB — T4, FREE: Free T4: 5.22 ng/dL — ABNORMAL HIGH (ref 0.60–1.60)

## 2020-06-24 LAB — TSH: TSH: <0.01 u[IU]/mL — ABNORMAL LOW (ref 0.35–4.50)

## 2020-06-24 NOTE — Patient Instructions (Addendum)
Blood tests are requested for you today.  We'll let you know about the results.   If it is overactive, we would check a thyroid "scan" (a special, but easy and painless type of thyroid x ray).  It works like this: you go to the x-ray department of the hospital to swallow a pill, which contains a miniscule amount of radiation.  You will not notice any symptoms from this.  You will go back to the x-ray department the next day, to lie down in front of a camera.  The results of this will be sent to me.   Based on the results, i hope to order for you a treatment pill of radioactive iodine.  Although it is a larger amount of radiation, you will again notice no symptoms from this.  The pill is gone from your body in a few days (during which you should stay away from other people), but takes several months to work.  Therefore, please return here approximately 6-8 weeks after the treatment.  This treatment has been available for many years, and the only known side-effect is an underactive thyroid.  It is possible that i would eventually prescribe for you a thyroid hormone pill, which is very inexpensive.  You don't have to worry about side-effects of this thyroid hormone pill, because it is the same molecule your thyroid makes.  If the leg swelling is caused by the immunity, it is hard to treat.

## 2020-06-24 NOTE — Progress Notes (Signed)
Subjective:    Patient ID: Dawn Bates, female    DOB: 1971/04/02, 50 y.o.   MRN: [id number]  HPI Pt returns for f/u of goiter due to thyroiditis (dx'ed 2021; US showed this and small nodule that did not need bx).  Pt says the goiter is bigger.  Past Medical History:  Diagnosis Date  . Fibroids   . Seasonal allergies     Past Surgical History:  Procedure Laterality Date  . TUBAL LIGATION      Social History   Socioeconomic History  . Marital status: Married    Spouse name: Not on file  . Number of children: Not on file  . Years of education: Not on file  . Highest education level: Not on file  Occupational History  . Not on file  Tobacco Use  . Smoking status: Never Smoker  . Smokeless tobacco: Never Used  Substance and Sexual Activity  . Alcohol use: No  . Drug use: No  . Sexual activity: Yes    Partners: Male    Birth control/protection: Surgical    Comment: BTL   Other Topics Concern  . Not on file  Social History Narrative  . Not on file   Social Determinants of Health   Financial Resource Strain: Not on file  Food Insecurity: Not on file  Transportation Needs: Not on file  Physical Activity: Not on file  Stress: Not on file  Social Connections: Not on file  Intimate Partner Violence: Not on file    Current Outpatient Medications on File Prior to Visit  Medication Sig Dispense Refill  . BIOTIN PO Take 1 capsule by mouth daily.    . ferrous sulfate 325 (65 FE) MG tablet Take 1 tablet (325 mg total) by mouth daily. 90 tablet 1  . Iron-FA-B Cmp-C-Biot-Probiotic (FUSION PLUS) CAPS Take 1 capsule by mouth daily.  3  . levocetirizine (XYZAL) 5 MG tablet Take 1 tablet (5 mg total) by mouth every evening. 90 tablet 2  . mometasone (NASONEX) 50 MCG/ACT nasal spray Place 2 sprays into the nose daily. 17 g 3  . tranexamic acid (LYSTEDA) 650 MG TABS tablet Take 1,300 mg by mouth 3 (three) times daily.    . Vitamin D, Ergocalciferol, (DRISDOL) 1.25 MG  (50000 UNIT) CAPS capsule Take 1 capsule (50,000 Units total) by mouth every 7 (seven) days. 12 capsule 3   No current facility-administered medications on file prior to visit.    Allergies  Allergen Reactions  . Latex Itching    Family History  Problem Relation Age of Onset  . Cancer Sister 30       breast "onset Late 20's"  . Breast cancer Sister   . Thyroid cancer Sister        pt does not know cell type  . Breast cancer Maternal Aunt     BP 124/60   Pulse 90   Ht 5\' 6"  (1.676 m)   Wt 144 lb (65.3 kg)   SpO2 94%   BMI 23.24 kg/m    Review of Systems She has slight pain at the ant neck.      Objective:   Physical Exam VS: see vs page GEN: no distress NECK: thyroid is 3-5 times normal size (R>L), with firm and irreg surface.    Ext: 1+ bilat leg edema, and slight hyperpigmentation of the ankles.   Lab Results  Component Value Date   TSH <0.01 Repeated and verified X2. (L) 06/24/2020  Assessment & Plan:  MNG, bigger Hyperthyroidism, new.  Due to the goiter.  I ordered scan, with plans for RAI.   Edema, poss due to hyperthyroidism.   Patient Instructions  Blood tests are requested for you today.  We'll let you know about the results.   If it is overactive, we would check a thyroid "scan" (a special, but easy and painless type of thyroid x ray).  It works like this: you go to the x-ray department of the hospital to swallow a pill, which contains a miniscule amount of radiation.  You will not notice any symptoms from this.  You will go back to the x-ray department the next day, to lie down in front of a camera.  The results of this will be sent to me.   Based on the results, i hope to order for you a treatment pill of radioactive iodine.  Although it is a larger amount of radiation, you will again notice no symptoms from this.  The pill is gone from your body in a few days (during which you should stay away from other people), but takes several months to work.   Therefore, please return here approximately 6-8 weeks after the treatment.  This treatment has been available for many years, and the only known side-effect is an underactive thyroid.  It is possible that i would eventually prescribe for you a thyroid hormone pill, which is very inexpensive.  You don't have to worry about side-effects of this thyroid hormone pill, because it is the same molecule your thyroid makes.  If the leg swelling is caused by the immunity, it is hard to treat.

## 2020-07-14 ENCOUNTER — Telehealth: Payer: Self-pay | Admitting: Endocrinology

## 2020-07-14 NOTE — Telephone Encounter (Signed)
Pt requested Dr Loanne Drilling give her a call regarding her upcoming surgery. Ph# [phone]

## 2020-07-15 ENCOUNTER — Encounter (HOSPITAL_COMMUNITY)
Admission: RE | Admit: 2020-07-15 | Discharge: 2020-07-15 | Disposition: A | Discharge status: Home / Self Care | Payer: 59 | Source: Ambulatory Visit | Attending: Endocrinology | Admitting: Endocrinology

## 2020-07-15 ENCOUNTER — Other Ambulatory Visit: Payer: Self-pay

## 2020-07-15 DIAGNOSIS — E069 Thyroiditis, unspecified: Secondary | ICD-10-CM | POA: Diagnosis not present

## 2020-07-15 MED ORDER — SODIUM IODIDE I-123 7.4 MBQ CAPS
449.0000 | ORAL_CAPSULE | Freq: Once | ORAL | Status: AC
  Administered 2020-07-15: 449 via ORAL

## 2020-07-15 NOTE — Telephone Encounter (Signed)
Called pt--stated had the scan done today and need to go back tomorrow for another procedure for thyroid--which costly and question if the cost included for 2nd procedure. Told pt --first month of the year and deductible starting  over and need to call insurance for verification.

## 2020-07-16 ENCOUNTER — Other Ambulatory Visit: Payer: Self-pay | Admitting: Endocrinology

## 2020-07-16 ENCOUNTER — Encounter (HOSPITAL_COMMUNITY)
Admission: RE | Admit: 2020-07-16 | Discharge: 2020-07-16 | Disposition: A | Discharge status: Home / Self Care | Payer: 59 | Source: Ambulatory Visit | Attending: Endocrinology | Admitting: Endocrinology

## 2020-07-16 DIAGNOSIS — E0590 Thyrotoxicosis, unspecified without thyrotoxic crisis or storm: Secondary | ICD-10-CM

## 2020-07-31 ENCOUNTER — Ambulatory Visit (HOSPITAL_COMMUNITY): Payer: 59

## 2020-08-04 ENCOUNTER — Other Ambulatory Visit: Payer: Self-pay | Admitting: Family Medicine

## 2020-08-04 DIAGNOSIS — J301 Allergic rhinitis due to pollen: Secondary | ICD-10-CM

## 2020-08-07 ENCOUNTER — Ambulatory Visit (HOSPITAL_COMMUNITY)
Admission: RE | Admit: 2020-08-07 | Discharge: 2020-08-07 | Disposition: A | Discharge status: Home / Self Care | Payer: 59 | Source: Ambulatory Visit | Attending: Endocrinology | Admitting: Endocrinology

## 2020-08-07 ENCOUNTER — Other Ambulatory Visit: Payer: Self-pay

## 2020-08-07 DIAGNOSIS — E0590 Thyrotoxicosis, unspecified without thyrotoxic crisis or storm: Secondary | ICD-10-CM | POA: Diagnosis not present

## 2020-08-07 LAB — PREGNANCY, URINE: Preg Test, Ur: NEGATIVE

## 2020-08-07 MED ORDER — SODIUM IODIDE I 131 CAPSULE
15.0000 | Freq: Once | INTRAVENOUS | Status: AC | PRN
  Administered 2020-08-07: 14.2 via ORAL

## 2020-09-10 ENCOUNTER — Other Ambulatory Visit: Payer: Self-pay | Admitting: Family Medicine

## 2020-10-01 ENCOUNTER — Ambulatory Visit: Payer: 59 | Admitting: Endocrinology

## 2020-10-05 ENCOUNTER — Telehealth: Payer: Self-pay | Admitting: Endocrinology

## 2020-10-05 NOTE — Telephone Encounter (Signed)
Pt called to schedule an appt to get her medications since she just had her thyroid surgery. She states she never has had these before but knows the medication is to help her out after her surgery. Dr. Loanne Drilling didn't have a sooner appt than one in May and she is wondering if that is too long to wait for her medication?   If Dr wants to go ahead and sent it in please send to:  [id number] - [place], [place]. Phone:  [phone]  Fax:  [phone]

## 2020-10-07 NOTE — Telephone Encounter (Signed)
Please schedule pt for F/U appt on 10/14/2020 @ 4:30 per Loanne Drilling

## 2020-10-07 NOTE — Telephone Encounter (Signed)
10/14/20, 4:30 PM

## 2020-10-26 ENCOUNTER — Other Ambulatory Visit: Payer: Self-pay

## 2020-10-26 ENCOUNTER — Telehealth: Payer: Self-pay

## 2020-10-26 ENCOUNTER — Ambulatory Visit: Payer: 59 | Admitting: Endocrinology

## 2020-10-26 VITALS — BP 142/80 | HR 69 | Ht 66.0 in | Wt 152.0 lb

## 2020-10-26 DIAGNOSIS — E0590 Thyrotoxicosis, unspecified without thyrotoxic crisis or storm: Secondary | ICD-10-CM | POA: Diagnosis not present

## 2020-10-26 DIAGNOSIS — D649 Anemia, unspecified: Secondary | ICD-10-CM | POA: Diagnosis not present

## 2020-10-26 NOTE — Telephone Encounter (Signed)
Yes, that is the cause.  It is hereditary--you did nothing to cause it.

## 2020-10-26 NOTE — Telephone Encounter (Signed)
Pt stated that thyroid comes from graves disease and she wanted to know if that what she has.  Please Advise

## 2020-10-26 NOTE — Progress Notes (Signed)
Subjective:    Patient ID: Dawn Bates, female    DOB: 01/15/1971, 50 y.o.   MRN: [id number]  HPI Pt returns for f/u of goiter due to thyroiditis (dx'ed 2021; US showed this and small nodule that did not need bx; she had RAI 2/22).  She reports swelling around the eyes and fatigue.  Pt requests recheck of cbc.  Past Medical History:  Diagnosis Date  . Fibroids   . Seasonal allergies     Past Surgical History:  Procedure Laterality Date  . TUBAL LIGATION      Social History   Socioeconomic History  . Marital status: Married    Spouse name: Not on file  . Number of children: Not on file  . Years of education: Not on file  . Highest education level: Not on file  Occupational History  . Not on file  Tobacco Use  . Smoking status: Never Smoker  . Smokeless tobacco: Never Used  Substance and Sexual Activity  . Alcohol use: No  . Drug use: No  . Sexual activity: Yes    Partners: Male    Birth control/protection: Surgical    Comment: BTL   Other Topics Concern  . Not on file  Social History Narrative  . Not on file   Social Determinants of Health   Financial Resource Strain: Not on file  Food Insecurity: Not on file  Transportation Needs: Not on file  Physical Activity: Not on file  Stress: Not on file  Social Connections: Not on file  Intimate Partner Violence: Not on file    Current Outpatient Medications on File Prior to Visit  Medication Sig Dispense Refill  . BIOTIN PO Take 1 capsule by mouth daily.    . ferrous sulfate 325 (65 FE) MG tablet Take 1 tablet (325 mg total) by mouth daily. 90 tablet 1  . Iron-FA-B Cmp-C-Biot-Probiotic (FUSION PLUS) CAPS Take 1 capsule by mouth daily.  3  . levocetirizine (XYZAL) 5 MG tablet TAKE 1 TABLET BY MOUTH EVERY DAY IN THE EVENING 30 tablet 8  . mometasone (NASONEX) 50 MCG/ACT nasal spray Place 2 sprays into the nose daily. 17 g 3  . tranexamic acid (LYSTEDA) 650 MG TABS tablet Take 1,300 mg by mouth 3 (three)  times daily.    . Vitamin D, Ergocalciferol, (DRISDOL) 1.25 MG (50000 UNIT) CAPS capsule TAKE 1 CAPSULE (50,000 UNITS TOTAL) BY MOUTH EVERY 7 (SEVEN) DAYS. 12 capsule 3   No current facility-administered medications on file prior to visit.    Allergies  Allergen Reactions  . Latex Itching    Family History  Problem Relation Age of Onset  . Cancer Sister 57       breast "onset Late 20's"  . Breast cancer Sister   . Thyroid cancer Sister        pt does not know cell type  . Breast cancer Maternal Aunt     BP (!) 142/80   Pulse 69   Ht 5\' 6"  (1.676 m)   Wt 152 lb (68.9 kg)   SpO2 95%   BMI 24.53 kg/m    Review of Systems     Objective:   Physical Exam VS: see vs page GEN: no distress NECK: thyroid is slightly enlarged (R>L), with irreg surface.   Lab Results  Component Value Date   TSH 10.01 (H) 10/26/2020      Assessment & Plan:  Hypothyroidism, due to RAI, new I have sent a prescription to your pharmacy,  for synthroid.

## 2020-10-26 NOTE — Patient Instructions (Addendum)
Blood tests are requested for you today.  We'll let you know about the results.   Based on the results, we probably need to start a thyroid hormone pill.  Please come back for a follow-up appointment in 2 months.

## 2020-10-26 NOTE — Telephone Encounter (Signed)
Message sent thru MyChart 

## 2020-10-27 MED ORDER — LEVOTHYROXINE SODIUM 75 MCG PO TABS: 75.0000 ug | ORAL_TABLET | Freq: Every day | ORAL | 3 refills | Status: DC

## 2020-10-30 LAB — CBC WITH DIFFERENTIAL/PLATELET
Absolute Monocytes: 320 cells/uL (ref 200–950)
Basophils Absolute: 40 cells/uL (ref 0–200)
Basophils Relative: 0.8 %
Eosinophils Absolute: 190 cells/uL (ref 15–500)
Eosinophils Relative: 3.8 %
HCT: 35.2 % (ref 35.0–45.0)
Hemoglobin: 10.6 g/dL — ABNORMAL LOW (ref 11.7–15.5)
Lymphs Abs: 2075 cells/uL (ref 850–3900)
MCH: 24.3 pg — ABNORMAL LOW (ref 27.0–33.0)
MCHC: 30.1 g/dL — ABNORMAL LOW (ref 32.0–36.0)
MCV: 80.5 fL (ref 80.0–100.0)
MPV: 11.6 fL (ref 7.5–12.5)
Monocytes Relative: 6.4 %
Neutro Abs: 2375 cells/uL (ref 1500–7800)
Neutrophils Relative %: 47.5 %
Platelets: 209 10*3/uL (ref 140–400)
RBC: 4.37 10*6/uL (ref 3.80–5.10)
RDW: 15.7 % — ABNORMAL HIGH (ref 11.0–15.0)
Total Lymphocyte: 41.5 %
WBC: 5 10*3/uL (ref 3.8–10.8)

## 2020-10-30 LAB — TSH: TSH: 10.01 mIU/L — ABNORMAL HIGH

## 2020-10-30 LAB — IRON,TIBC AND FERRITIN PANEL
%SAT: 6 % (calc) — ABNORMAL LOW (ref 16–45)
Ferritin: 8 ng/mL — ABNORMAL LOW (ref 16–232)
Iron: 26 ug/dL — ABNORMAL LOW (ref 40–190)
TIBC: 403 mcg/dL (calc) (ref 250–450)

## 2020-10-30 LAB — T4, FREE: Free T4: 0.8 ng/dL (ref 0.8–1.8)

## 2020-11-17 ENCOUNTER — Ambulatory Visit: Payer: 59 | Admitting: Endocrinology

## 2021-01-01 ENCOUNTER — Ambulatory Visit: Payer: 59 | Admitting: Endocrinology

## 2021-02-05 ENCOUNTER — Ambulatory Visit (INDEPENDENT_AMBULATORY_CARE_PROVIDER_SITE_OTHER): Payer: BC Managed Care – PPO | Admitting: Endocrinology

## 2021-02-05 ENCOUNTER — Other Ambulatory Visit: Payer: Self-pay

## 2021-02-05 VITALS — BP 158/86 | HR 74 | Ht 66.0 in | Wt 149.8 lb

## 2021-02-05 DIAGNOSIS — E0590 Thyrotoxicosis, unspecified without thyrotoxic crisis or storm: Secondary | ICD-10-CM

## 2021-02-05 LAB — T4, FREE: Free T4: 1.5 ng/dL (ref 0.8–1.8)

## 2021-02-05 LAB — TSH: TSH: 0.01 mIU/L — ABNORMAL LOW

## 2021-02-05 NOTE — Progress Notes (Signed)
   Subjective:    Patient ID: Dawn Bates, female    DOB: 04-26-1971, 50 y.o.   MRN: [id number]  HPI Pt returns for f/u of goiter due to thyroiditis (dx'ed 2021; US showed this and small nodule that did not need bx; she had RAI 2/22; she started synthroid 5/22; opthal did not recommend any rx for opthalmopathy).  She reports swelling around the eyes and fatigue.   Past Medical History:  Diagnosis Date   Fibroids    Seasonal allergies     Past Surgical History:  Procedure Laterality Date   TUBAL LIGATION      Social History   Socioeconomic History   Marital status: Married    Spouse name: Not on file   Number of children: Not on file   Years of education: Not on file   Highest education level: Not on file  Occupational History   Not on file  Tobacco Use   Smoking status: Never   Smokeless tobacco: Never  Substance and Sexual Activity   Alcohol use: No   Drug use: No   Sexual activity: Yes    Partners: Male    Birth control/protection: Surgical    Comment: BTL   Other Topics Concern   Not on file  Social History Narrative   Not on file   Social Determinants of Health   Financial Resource Strain: Not on file  Food Insecurity: Not on file  Transportation Needs: Not on file  Physical Activity: Not on file  Stress: Not on file  Social Connections: Not on file  Intimate Partner Violence: Not on file    Current Outpatient Medications on File Prior to Visit  Medication Sig Dispense Refill   BIOTIN PO Take 1 capsule by mouth daily.     ferrous sulfate 325 (65 FE) MG tablet Take 1 tablet (325 mg total) by mouth daily. 90 tablet 1   Iron-FA-B Cmp-C-Biot-Probiotic (FUSION PLUS) CAPS Take 1 capsule by mouth daily.  3   levocetirizine (XYZAL) 5 MG tablet TAKE 1 TABLET BY MOUTH EVERY DAY IN THE EVENING 30 tablet 8   mometasone (NASONEX) 50 MCG/ACT nasal spray Place 2 sprays into the nose daily. 17 g 3   tranexamic acid (LYSTEDA) 650 MG TABS tablet Take 1,300 mg by  mouth 3 (three) times daily.     Vitamin D, Ergocalciferol, (DRISDOL) 1.25 MG (50000 UNIT) CAPS capsule TAKE 1 CAPSULE (50,000 UNITS TOTAL) BY MOUTH EVERY 7 (SEVEN) DAYS. 12 capsule 3   No current facility-administered medications on file prior to visit.    Allergies  Allergen Reactions   Latex Itching    BP (!) 158/86 (BP Location: Right Arm, Patient Position: Sitting, Cuff Size: Normal)   Pulse 74   Ht '5\' 6"'$  (1.676 m)   Wt 149 lb 12.8 oz (67.9 kg)   SpO2 98%   BMI 24.18 kg/m    Review of Systems     Objective:   Physical Exam NECK: There is no palpable thyroid enlargement.  No thyroid nodule is palpable.  No palpable lymphadenopathy at the anterior neck.   Lab Results  Component Value Date   TSH 0.01 (L) 02/05/2021      Assessment & Plan:  Hypothyroidism: overcontrolled.  She may need to d/c synthroid, but for now, reduce to 25/d Please come back for a follow-up appointment in 2 months.

## 2021-02-05 NOTE — Patient Instructions (Addendum)
Your blood pressure is high today.  Please see your primary care provider soon, to have it rechecked Blood tests are requested for you today.  We'll let you know about the results.   Based on the results, we probably need to increase the thyroid hormone pill.   It is very important to follow up the anemia with Dr [place].   Please come back for a follow-up appointment in 2 months.

## 2021-02-06 MED ORDER — LEVOTHYROXINE SODIUM 25 MCG PO TABS: 25.0000 ug | ORAL_TABLET | Freq: Every day | ORAL | 3 refills | Status: DC

## 2021-02-12 ENCOUNTER — Ambulatory Visit (INDEPENDENT_AMBULATORY_CARE_PROVIDER_SITE_OTHER): Payer: BC Managed Care – PPO | Admitting: Family Medicine

## 2021-02-12 ENCOUNTER — Encounter: Payer: Self-pay | Admitting: Family Medicine

## 2021-02-12 ENCOUNTER — Other Ambulatory Visit: Payer: Self-pay

## 2021-02-12 VITALS — BP 132/84 | HR 65 | Temp 97.8°F | Resp 16 | Ht 66.0 in | Wt 149.4 lb

## 2021-02-12 DIAGNOSIS — E785 Hyperlipidemia, unspecified: Secondary | ICD-10-CM

## 2021-02-12 DIAGNOSIS — Z114 Encounter for screening for human immunodeficiency virus [HIV]: Secondary | ICD-10-CM

## 2021-02-12 DIAGNOSIS — I10 Essential (primary) hypertension: Secondary | ICD-10-CM

## 2021-02-12 DIAGNOSIS — J301 Allergic rhinitis due to pollen: Secondary | ICD-10-CM

## 2021-02-12 DIAGNOSIS — D649 Anemia, unspecified: Secondary | ICD-10-CM

## 2021-02-12 DIAGNOSIS — R7989 Other specified abnormal findings of blood chemistry: Secondary | ICD-10-CM

## 2021-02-12 DIAGNOSIS — E559 Vitamin D deficiency, unspecified: Secondary | ICD-10-CM | POA: Diagnosis not present

## 2021-02-12 HISTORY — DX: Anemia, unspecified: D64.9

## 2021-02-12 MED ORDER — CHLORTHALIDONE 25 MG PO TABS: 25.0000 mg | ORAL_TABLET | Freq: Every day | ORAL | 0 refills | Status: DC

## 2021-02-12 NOTE — Patient Instructions (Addendum)
A few things to remember from today's visit:   Hyperlipidemia, unspecified hyperlipidemia type - Plan: Lipid panel  Vitamin D deficiency, unspecified - Plan: VITAMIN D 25 Hydroxy (Vit-D Deficiency, Fractures)  Non-seasonal allergic rhinitis due to pollen  Elevated LFTs - Plan: Hepatitis B surface antibody,qualitative, Hepatitis B surface antigen, Hepatitis C antibody, Hepatic function panel  Encounter for screening for HIV - Plan: HIV Antibody (routine testing w rflx)  If you need refills please call your pharmacy. Do not use My Chart to request refills or for acute issues that need immediate attention.   Nasonex nasal spray and Singulair 10 mg added today. Nasal saline irrigations as needed.  [email or web address]">  DASH Eating Plan DASH stands for Dietary Approaches to Stop Hypertension. The DASH eating plan is a healthy eating plan that has been shown to: Reduce high blood pressure (hypertension). Reduce your risk for type 2 diabetes, heart disease, and stroke. Help with weight loss. What are tips for following this plan? Reading food labels Check food labels for the amount of salt (sodium) per serving. Choose foods with less than 5 percent of the Daily Value of sodium. Generally, foods with less than 300 milligrams (mg) of sodium per serving fit into this eating plan. To find whole grains, look for the word "whole" as the first word in the ingredient list. Shopping Buy products labeled as "low-sodium" or "no salt added." Buy fresh foods. Avoid canned foods and pre-made or frozen meals. Cooking Avoid adding salt when cooking. Use salt-free seasonings or herbs instead of table salt or sea salt. Check with your health care provider or pharmacist before using salt substitutes. Do not fry foods. Cook foods using healthy methods such as baking, boiling, grilling, roasting, and broiling instead. Cook with heart-healthy oils, such as olive,  canola, avocado, soybean, or sunflower oil. Meal planning  Eat a balanced diet that includes: 4 or more servings of fruits and 4 or more servings of vegetables each day. Try to fill one-half of your plate with fruits and vegetables. 6-8 servings of whole grains each day. Less than 6 oz (170 g) of lean meat, poultry, or fish each day. A 3-oz (85-g) serving of meat is about the same size as a deck of cards. One egg equals 1 oz (28 g). 2-3 servings of low-fat dairy each day. One serving is 1 cup (237 mL). 1 serving of nuts, seeds, or beans 5 times each week. 2-3 servings of heart-healthy fats. Healthy fats called omega-3 fatty acids are found in foods such as walnuts, flaxseeds, fortified milks, and eggs. These fats are also found in cold-water fish, such as sardines, salmon, and mackerel. Limit how much you eat of: Canned or prepackaged foods. Food that is high in trans fat, such as some fried foods. Food that is high in saturated fat, such as fatty meat. Desserts and other sweets, sugary drinks, and other foods with added sugar. Full-fat dairy products. Do not salt foods before eating. Do not eat more than 4 egg yolks a week. Try to eat at least 2 vegetarian meals a week. Eat more home-cooked food and less restaurant, buffet, and fast food.  Lifestyle When eating at a restaurant, ask that your food be prepared with less salt or no salt, if possible. If you drink alcohol: Limit how much you use to: 0-1 drink a day for women who are not pregnant. 0-2 drinks a day for men. Be aware of how much alcohol is in your drink. In the [place] one drink  equals one 12 oz bottle of beer (355 mL), one 5 oz glass of wine (148 mL), or one 1 oz glass of hard liquor (44 mL). General information Avoid eating more than 2,300 mg of salt a day. If you have hypertension, you may need to reduce your sodium intake to 1,500 mg a day. Work with your health care provider to maintain a healthy body weight or to lose  weight. Ask what an ideal weight is for you. Get at least 30 minutes of exercise that causes your heart to beat faster (aerobic exercise) most days of the week. Activities may include walking, swimming, or biking. Work with your health care provider or dietitian to adjust your eating plan to your individual calorie needs. What foods should I eat? Fruits All fresh, dried, or frozen fruit. Canned fruit in natural juice (without addedsugar). Vegetables Fresh or frozen vegetables (raw, steamed, roasted, or grilled). Low-sodium or reduced-sodium tomato and vegetable juice. Low-sodium or reduced-sodium tomatosauce and tomato paste. Low-sodium or reduced-sodium canned vegetables. Grains Whole-grain or whole-wheat bread. Whole-grain or whole-wheat pasta. Brown rice. [place]. Bulgur. Whole-grain and low-sodium cereals. Pita bread.Low-fat, low-sodium crackers. Whole-wheat flour tortillas. Meats and other proteins Skinless chicken or [place]. Ground chicken or [place]. Pork with fat trimmed off. Fish and seafood. Egg whites. Dried beans, peas, or lentils. Unsalted nuts, nut butters, and seeds. Unsalted canned beans. Lean cuts of beef with fat trimmed off. Low-sodium, lean precooked or cured meat, such as sausages or meatloaves. Dairy Low-fat (1%) or fat-free (skim) milk. Reduced-fat, low-fat, or fat-free cheeses. Nonfat, low-sodium ricotta or cottage cheese. Low-fat or nonfatyogurt. Low-fat, low-sodium cheese. Fats and oils Soft margarine without trans fats. Vegetable oil. Reduced-fat, low-fat, or light mayonnaise and salad dressings (reduced-sodium). Canola, safflower, olive, avocado, soybean, andsunflower oils. Avocado. Seasonings and condiments Herbs. Spices. Seasoning mixes without salt. Other foods Unsalted popcorn and pretzels. Fat-free sweets. The items listed above may not be a complete list of foods and beverages you can eat. Contact a dietitian for more information. What foods should I  avoid? Fruits Canned fruit in a light or heavy syrup. Fried fruit. Fruit in cream or buttersauce. Vegetables Creamed or fried vegetables. Vegetables in a cheese sauce. Regular canned vegetables (not low-sodium or reduced-sodium). Regular canned tomato sauce and paste (not low-sodium or reduced-sodium). Regular tomato and vegetable juice(not low-sodium or reduced-sodium). Angie Fava. Olives. Grains Baked goods made with fat, such as croissants, muffins, or some breads. Drypasta or rice meal packs. Meats and other proteins Fatty cuts of meat. Ribs. Fried meat. Berniece Salines. Bologna, salami, and other precooked or cured meats, such as sausages or meat loaves. Fat from the back of a pig (fatback). Bratwurst. Salted nuts and seeds. Canned beans with added salt. Canned orsmoked fish. Whole eggs or egg yolks. Chicken or [place] with skin. Dairy Whole or 2% milk, cream, and half-and-half. Whole or full-fat cream cheese. Whole-fat or sweetened yogurt. Full-fat cheese. Nondairy creamers. Whippedtoppings. Processed cheese and cheese spreads. Fats and oils Butter. Stick margarine. Lard. Shortening. Ghee. Bacon fat. Tropical oils, suchas coconut, palm kernel, or palm oil. Seasonings and condiments Onion salt, garlic salt, seasoned salt, table salt, and sea salt. Worcestershire sauce. Tartar sauce. Barbecue sauce. Teriyaki sauce. Soy sauce, including reduced-sodium. Steak sauce. Canned and packaged gravies. Fish sauce. Oyster sauce. Cocktail sauce. Store-bought horseradish. Ketchup. Mustard. Meat flavorings and tenderizers. Bouillon cubes. Hot sauces. Pre-made or packaged marinades. Pre-made or packaged taco seasonings. Relishes. Regular saladdressings. Other foods Salted popcorn and pretzels. The items listed above may  not be a complete list of foods and beverages you should avoid. Contact a dietitian for more information. Where to find more information National Heart, Lung, and Blood Institute:  https://wilson-eaton.com/ American Heart Association: www.heart.org Academy of Nutrition and Dietetics: www.eatright.[place]: www.kidney.org Summary The DASH eating plan is a healthy eating plan that has been shown to reduce high blood pressure (hypertension). It may also reduce your risk for type 2 diabetes, heart disease, and stroke. When on the DASH eating plan, aim to eat more fresh fruits and vegetables, whole grains, lean proteins, low-fat dairy, and heart-healthy fats. With the DASH eating plan, you should limit salt (sodium) intake to 2,300 mg a day. If you have hypertension, you may need to reduce your sodium intake to 1,500 mg a day. Work with your health care provider or dietitian to adjust your eating plan to your individual calorie needs. This information is not intended to replace advice given to you by your health care provider. Make sure you discuss any questions you have with your healthcare provider. Document Revised: 05/10/2019 Document Reviewed: 05/10/2019 Elsevier Patient Education  2022 [place].  Please be sure medication list is accurate. If a new problem present, please set up appointment sooner than planned today.

## 2021-02-12 NOTE — Progress Notes (Signed)
Chief Complaint  Patient presents with   Follow-up   Hypertension   HPI: [name] is a 50 y.o. female, who is here today to follow on some chronic medical problems and concerned about elevated BP. No hx of HTN. BP has been elevated at her endocrinologist's office, last BP 02/05/21 was 158/96. She is not checking BP at home.  Hypertension This is a new problem. The current episode started 1 to 4 weeks ago. The problem is uncontrolled. Associated symptoms include headaches and malaise/fatigue. Pertinent negatives include no anxiety, blurred vision, neck pain or orthopnea. Agents associated with hypertension include thyroid hormones. Risk factors for coronary artery disease include sedentary lifestyle. Past treatments include nothing.  Lab Results  Component Value Date   CREATININE 0.51 05/21/2020   BUN 17 05/21/2020   NA 142 05/21/2020   K 4.4 05/21/2020   CL 108 05/21/2020   CO2 23 05/21/2020   For about 3 months bitemporal headache, 6/10, it is not daily. 2-3 times per months. Pressure like headache. No associated symptoms. It lasts a few hours, alleviated by applying local pressure and lying down. She has not identified exacerbating or alleviating factors.  Vit D deficiency: She is on Ergocalciferol 50,000 U weekly. Last 25 OH vit D was 66 in 07/2019.  HLD: She is on non pharmacologic treatment. 07/31/19 TC 194, HDL 67,TG 56,and LDL 113.  She is requesting a note for work, so she can continue working from home. Allergic rhinitis, exacerbated by environment temp changes. Home remedies like hot tea help, which is easier while she is at home. She is not using Nasonex nasal spray. She is on Xyzal 5 mg daily. She has been evaluated by immunologist and according to pt, she did not have major benefits from immunotherapy. She has also seen ENT. Nasal congestion also affects her sleep.  She has been working from home for the past 2 years but was asked to go back  to office. She works from 8 Am to [place]. She can perform all her job functions from home.  Bilateral eye heaviness sensation. States that she saw her eye care provider and had an eye exam, thyroid disease thought to be the etiologic factor.  Anemia: She is on iron supplementation. She has not noted blood in stool or melena. She saw Dr Man for consultation and pending colonoscopy.  Lab Results  Component Value Date   WBC 5.0 10/26/2020   HGB 10.6 (L) 10/26/2020   HCT 35.2 10/26/2020   MCV 80.5 10/26/2020   PLT 209 10/26/2020   Elevated transaminase. States that problem was addressed by her GI, Dr Man. Denies hx of high alcohol intake. Negative for abdominal pain,N/V,or jaundice.  Lab Results  Component Value Date   ALT 84 (H) 05/21/2020   AST 51 (H) 05/21/2020   BILITOT 0.6 05/21/2020   Review of Systems  Constitutional:  Positive for fatigue and malaise/fatigue. Negative for activity change, appetite change and fever.  HENT:  Negative for mouth sores, nosebleeds and trouble swallowing.   Eyes:  Negative for blurred vision, photophobia and discharge.  Respiratory:  Negative for cough and wheezing.   Cardiovascular:  Negative for orthopnea and leg swelling.  Gastrointestinal:        Negative for changes in bowel habits.  Genitourinary:  Negative for decreased urine volume, dysuria and hematuria.  Musculoskeletal:  Negative for gait problem, myalgias and neck pain.  Skin:  Negative for rash.  Allergic/Immunologic: Positive for environmental allergies.  Neurological:  Positive for headaches. Negative for syncope and weakness.  Psychiatric/Behavioral:  Positive for sleep disturbance. Negative for confusion.   Rest see pertinent positives and negatives per HPI.  Current Outpatient Medications on File Prior to Visit  Medication Sig Dispense Refill   BIOTIN PO Take 1 capsule by mouth daily.     ferrous sulfate 325 (65 FE) MG tablet Take 1 tablet (325 mg total) by mouth daily. 90  tablet 1   Iron-FA-B Cmp-C-Biot-Probiotic (FUSION PLUS) CAPS Take 1 capsule by mouth daily.  3   levocetirizine (XYZAL) 5 MG tablet TAKE 1 TABLET BY MOUTH EVERY DAY IN THE EVENING 30 tablet 8   levothyroxine (SYNTHROID) 25 MCG tablet Take 1 tablet (25 mcg total) by mouth daily. 90 tablet 3   tranexamic acid (LYSTEDA) 650 MG TABS tablet Take 1,300 mg by mouth 3 (three) times daily.     Vitamin D, Ergocalciferol, (DRISDOL) 1.25 MG (50000 UNIT) CAPS capsule TAKE 1 CAPSULE (50,000 UNITS TOTAL) BY MOUTH EVERY 7 (SEVEN) DAYS. 12 capsule 3   No current facility-administered medications on file prior to visit.   Past Medical History:  Diagnosis Date   Fibroids    Seasonal allergies    Allergies  Allergen Reactions   Latex Itching    Social History   Socioeconomic History   Marital status: Married    Spouse name: Not on file   Number of children: Not on file   Years of education: Not on file   Highest education level: Not on file  Occupational History   Not on file  Tobacco Use   Smoking status: Never   Smokeless tobacco: Never  Substance and Sexual Activity   Alcohol use: No   Drug use: No   Sexual activity: Yes    Partners: Male    Birth control/protection: Surgical    Comment: BTL   Other Topics Concern   Not on file  Social History Narrative   Not on file   Social Determinants of Health   Financial Resource Strain: Not on file  Food Insecurity: Not on file  Transportation Needs: Not on file  Physical Activity: Not on file  Stress: Not on file  Social Connections: Not on file    Vitals:   02/12/21 1016  BP: 132/84  Pulse: 65  Resp: 16  Temp: 97.8 F (36.6 C)  SpO2: 98%   Body mass index is 24.11 kg/m.  Physical Exam Vitals and nursing note reviewed.  Constitutional:      General: She is not in acute distress.    Appearance: She is well-developed.  HENT:     Head: Normocephalic and atraumatic.     Nose: Congestion and rhinorrhea present.     Right  Nostril: Occlusion present.     Right Turbinates: Enlarged.     Left Turbinates: Enlarged.     Mouth/Throat:     Mouth: Mucous membranes are moist.     Pharynx: Oropharynx is clear.  Eyes:     Conjunctiva/sclera: Conjunctivae normal.  Cardiovascular:     Rate and Rhythm: Normal rate and regular rhythm.     Pulses:          Dorsalis pedis pulses are 2+ on the right side and 2+ on the left side.     Heart sounds: No murmur heard. Pulmonary:     Effort: Pulmonary effort is normal. No respiratory distress.     Breath sounds: Normal breath sounds.  Abdominal:     Palpations: Abdomen  is soft. There is no hepatomegaly or mass.     Tenderness: There is no abdominal tenderness.  Lymphadenopathy:     Cervical: No cervical adenopathy.  Skin:    General: Skin is warm.     Findings: No erythema or rash.  Neurological:     General: No focal deficit present.     Mental Status: She is alert and oriented to person, place, and time.     Cranial Nerves: No cranial nerve deficit.     Gait: Gait normal.  Psychiatric:     Comments: Well groomed, good eye contact.   ASSESSMENT AND PLAN:  Dawn Bates was seen today for follow-up and hypertension.  Diagnoses and all orders for this visit: Orders Placed This Encounter  Procedures   Hepatitis B surface antibody,qualitative   Hepatitis B surface antigen   Hepatitis C antibody   HIV Antibody (routine testing w rflx)   VITAMIN D 25 Hydroxy (Vit-D Deficiency, Fractures)   Hepatic function panel   Lipid panel   Lab Results  Component Value Date   ALT 13 02/12/2021   AST 18 02/12/2021   BILITOT 0.4 02/12/2021   Lab Results  Component Value Date   CHOL 186 02/12/2021   HDL 69 02/12/2021   LDLCALC 101 (H) 02/12/2021   TRIG 75 02/12/2021   CHOLHDL 2.7 02/12/2021   Elevated LFTs Asymptomatic. Further recommendations according to lab results.  Hyperlipidemia, unspecified hyperlipidemia type Non pharmacologic treatment to continue. Further  recommendations will be given according to 10 years CVD risk score and lipid panel numbers.  The 10-year ASCVD risk score Mikey Bussing DC Brooke Bonito., et al., 2013) is: 2.2%   Values used to calculate the score:     Age: 28 years     Sex: Female     Is Non-Hispanic African American: Yes     Diabetic: No     Tobacco smoker: No     Systolic Blood Pressure: [id number] mmHg     Is BP treated: Yes     HDL Cholesterol: 69 mg/dL     Total Cholesterol: 186 mg/dL  Vitamin D deficiency, unspecified Continue Ergocalciferol 50,000 U weekly, dose will be adjusted according to 25 OH vit D result.  Non-seasonal allergic rhinitis due to pollen Problem is not well controlled. Nasonex nasal spray 1 spray in each nostril bid. No changes in Xyzal dose. Nasal saline irrigations as needed. Singulair 10 mg at bedtime. Note for work given, so she can work from home 3 times per week as far as she can perform all her job functions from home.  -     mometasone (NASONEX) 50 MCG/ACT nasal spray; Place 2 sprays into the nose daily. -     montelukast (SINGULAIR) 10 MG tablet; Take 1 tablet (10 mg total) by mouth at bedtime.  Encounter for screening for HIV -     HIV Antibody (routine testing w rflx)  Primary hypertension Re-checked BP 155/80. We discussed treatment options, she agrees with Chlorthalidone 25 mg daily. Some side effects discussed. Low salt/DASH diet recommended. Monitor BP at home.  -     chlorthalidone (HYGROTON) 25 MG tablet; Take 1 tablet (25 mg total) by mouth daily.  Return in about 4 weeks (around 03/12/2021).   Andi Mahaffy G. Martinique, MD  [facility]. [place] office.

## 2021-02-13 MED ORDER — MONTELUKAST SODIUM 10 MG PO TABS: 10.0000 mg | ORAL_TABLET | Freq: Every day | ORAL | 1 refills | Status: DC

## 2021-02-13 MED ORDER — MOMETASONE FUROATE 50 MCG/ACT NA SUSP: 2.0000 | Freq: Every day | NASAL | 3 refills | Status: DC

## 2021-02-15 LAB — HEPATIC FUNCTION PANEL
AG Ratio: 1.5 (calc) (ref 1.0–2.5)
ALT: 13 U/L (ref 6–29)
AST: 18 U/L (ref 10–35)
Albumin: 4.7 g/dL (ref 3.6–5.1)
Alkaline phosphatase (APISO): 73 U/L (ref 31–125)
Bilirubin, Direct: 0.1 mg/dL (ref 0.0–0.2)
Globulin: 3.1 g/dL (calc) (ref 1.9–3.7)
Indirect Bilirubin: 0.3 mg/dL (calc) (ref 0.2–1.2)
Total Bilirubin: 0.4 mg/dL (ref 0.2–1.2)
Total Protein: 7.8 g/dL (ref 6.1–8.1)

## 2021-02-15 LAB — HEPATITIS B SURFACE ANTIGEN: Hepatitis B Surface Ag: NONREACTIVE

## 2021-02-15 LAB — LIPID PANEL
Cholesterol: 186 mg/dL (ref <200)
HDL: 69 mg/dL (ref >=50)
LDL Cholesterol (Calc): 101 mg/dL (calc) — ABNORMAL HIGH
Non-HDL Cholesterol (Calc): 117 mg/dL (calc) (ref <130)
Total CHOL/HDL Ratio: 2.7 (calc) (ref <5.0)
Triglycerides: 75 mg/dL (ref <150)

## 2021-02-15 LAB — VITAMIN D 25 HYDROXY (VIT D DEFICIENCY, FRACTURES): Vit D, 25-Hydroxy: 46 ng/mL (ref 30–100)

## 2021-02-15 LAB — HEPATITIS B SURFACE ANTIBODY,QUALITATIVE: Hep B S Ab: NONREACTIVE

## 2021-02-15 LAB — HEPATITIS C ANTIBODY
Hepatitis C Ab: NONREACTIVE
SIGNAL TO CUT-OFF: 0.01 (ref <1.00)

## 2021-02-15 LAB — HIV ANTIBODY (ROUTINE TESTING W REFLEX): HIV 1&2 Ab, 4th Generation: NONREACTIVE

## 2021-03-16 ENCOUNTER — Telehealth: Payer: Self-pay | Admitting: Family Medicine

## 2021-03-16 NOTE — Telephone Encounter (Signed)
Chlorthalidone and HCTZ can cause electrolyte abnormalities. It can decrease sodium and potassium,and increase blood sugar (diabetes is not listed on side effects) and uric acid. Every medication has side effects but if she is concerned about this one in particular, she can try Amlodipine 5 mg at bedtime and stop Chlorthalidone. Thanks, BJ

## 2021-03-16 NOTE — Telephone Encounter (Signed)
PT called to advise that she was prescribed some high BP medication by Dr.Jordan but after doing research on the Internet she notice that its a high risk cause of diabetes. Please advise as to what else she can take as she has concerns.

## 2021-03-17 MED ORDER — AMLODIPINE BESYLATE 5 MG PO TABS: 5.0000 mg | ORAL_TABLET | Freq: Every day | ORAL | 3 refills | Status: DC

## 2021-03-17 NOTE — Telephone Encounter (Signed)
I called and spoke with patient. We went over the information below, she would like to stop the Chlorthalidone and start the Amlodipine. Rx sent in for patient, advised for her to keep a check on her blood pressure and to keep her follow up appointment on 10/7.

## 2021-03-17 NOTE — Addendum Note (Signed)
Addended by: Rodrigo Ran on: 03/17/2021 10:13 AM   Modules accepted: Orders

## 2021-03-19 ENCOUNTER — Ambulatory Visit: Payer: BC Managed Care – PPO | Admitting: Family Medicine

## 2021-03-26 ENCOUNTER — Ambulatory Visit: Payer: BC Managed Care – PPO | Admitting: Family Medicine

## 2021-04-02 ENCOUNTER — Other Ambulatory Visit: Payer: Self-pay | Admitting: Family Medicine

## 2021-04-02 DIAGNOSIS — I10 Essential (primary) hypertension: Secondary | ICD-10-CM

## 2021-04-09 ENCOUNTER — Ambulatory Visit: Payer: BC Managed Care – PPO | Admitting: Endocrinology

## 2021-04-09 ENCOUNTER — Other Ambulatory Visit: Payer: Self-pay

## 2021-04-09 VITALS — BP 130/74 | HR 74 | Ht 66.0 in | Wt 152.4 lb

## 2021-04-09 DIAGNOSIS — E0590 Thyrotoxicosis, unspecified without thyrotoxic crisis or storm: Secondary | ICD-10-CM

## 2021-04-09 LAB — TSH: TSH: 1.67 mIU/L

## 2021-04-09 LAB — T4, FREE: Free T4: 1 ng/dL (ref 0.8–1.8)

## 2021-04-09 NOTE — Progress Notes (Signed)
Subjective:    Patient ID: Dawn Bates, female    DOB: 21-Oct-1970, 50 y.o.   MRN: [id number]  HPI Pt returns for f/u of post-RAI hypothyroidism (dx'ed 2021; US showed this and small nodule that did not need bx; she had RAI 2/22; she started synthroid 5/22; opthal did not recommend any rx for opthalmopathy).  She reports ongoing fatigue.  Past Medical History:  Diagnosis Date   Fibroids    Seasonal allergies     Past Surgical History:  Procedure Laterality Date   TUBAL LIGATION      Social History   Socioeconomic History   Marital status: Married    Spouse name: Not on file   Number of children: Not on file   Years of education: Not on file   Highest education level: Not on file  Occupational History   Not on file  Tobacco Use   Smoking status: Never   Smokeless tobacco: Never  Substance and Sexual Activity   Alcohol use: No   Drug use: No   Sexual activity: Yes    Partners: Male    Birth control/protection: Surgical    Comment: BTL   Other Topics Concern   Not on file  Social History Narrative   Not on file   Social Determinants of Health   Financial Resource Strain: Not on file  Food Insecurity: Not on file  Transportation Needs: Not on file  Physical Activity: Not on file  Stress: Not on file  Social Connections: Not on file  Intimate Partner Violence: Not on file    Current Outpatient Medications on File Prior to Visit  Medication Sig Dispense Refill   amLODipine (NORVASC) 5 MG tablet Take 1 tablet (5 mg total) by mouth daily. 30 tablet 3   BIOTIN PO Take 1 capsule by mouth daily.     ferrous sulfate 325 (65 FE) MG tablet Take 1 tablet (325 mg total) by mouth daily. 90 tablet 1   Iron-FA-B Cmp-C-Biot-Probiotic (FUSION PLUS) CAPS Take 1 capsule by mouth daily.  3   levocetirizine (XYZAL) 5 MG tablet TAKE 1 TABLET BY MOUTH EVERY DAY IN THE EVENING 30 tablet 8   levothyroxine (SYNTHROID) 25 MCG tablet Take 1 tablet (25 mcg total) by mouth daily.  90 tablet 3   mometasone (NASONEX) 50 MCG/ACT nasal spray Place 2 sprays into the nose daily. 17 g 3   montelukast (SINGULAIR) 10 MG tablet Take 1 tablet (10 mg total) by mouth at bedtime. 90 tablet 1   tranexamic acid (LYSTEDA) 650 MG TABS tablet Take 1,300 mg by mouth 3 (three) times daily.     Vitamin D, Ergocalciferol, (DRISDOL) 1.25 MG (50000 UNIT) CAPS capsule TAKE 1 CAPSULE (50,000 UNITS TOTAL) BY MOUTH EVERY 7 (SEVEN) DAYS. 12 capsule 3   No current facility-administered medications on file prior to visit.    Allergies  Allergen Reactions   Latex Itching    Family History  Problem Relation Age of Onset   Cancer Sister 37       breast "onset Late 20's"   Breast cancer Sister    Thyroid cancer Sister        pt does not know cell type   Breast cancer Maternal Aunt     BP 130/74 (BP Location: Right Arm, Patient Position: Sitting, Cuff Size: Normal)   Pulse 74   Ht 5\' 6"  (1.676 m)   Wt 152 lb 6.4 oz (69.1 kg)   SpO2 98%   BMI 24.60 kg/m  Review of Systems     Objective:   Physical Exam NECK: There is no palpable thyroid enlargement.  No thyroid nodule is palpable.  No palpable lymphadenopathy at the anterior neck.   Lab Results  Component Value Date   TSH 1.67 04/09/2021      Assessment & Plan:  Hypothyroidism.  well-controlled.  Please continue the same synthroid

## 2021-04-09 NOTE — Patient Instructions (Addendum)
Blood tests are requested for you today.  We'll let you know about the results.  Please come back for a follow-up appointment in 3 months.   

## 2021-04-23 ENCOUNTER — Other Ambulatory Visit: Payer: Self-pay

## 2021-04-23 ENCOUNTER — Encounter: Payer: Self-pay | Admitting: Family Medicine

## 2021-04-23 ENCOUNTER — Ambulatory Visit: Payer: BC Managed Care – PPO | Admitting: Family Medicine

## 2021-04-23 VITALS — BP 124/70 | HR 63 | Resp 16 | Ht 66.0 in | Wt 150.4 lb

## 2021-04-23 DIAGNOSIS — D500 Iron deficiency anemia secondary to blood loss (chronic): Secondary | ICD-10-CM | POA: Diagnosis not present

## 2021-04-23 DIAGNOSIS — Z23 Encounter for immunization: Secondary | ICD-10-CM | POA: Diagnosis not present

## 2021-04-23 DIAGNOSIS — R300 Dysuria: Secondary | ICD-10-CM | POA: Diagnosis not present

## 2021-04-23 DIAGNOSIS — I10 Essential (primary) hypertension: Secondary | ICD-10-CM

## 2021-04-23 DIAGNOSIS — N938 Other specified abnormal uterine and vaginal bleeding: Secondary | ICD-10-CM

## 2021-04-23 DIAGNOSIS — R600 Localized edema: Secondary | ICD-10-CM

## 2021-04-23 NOTE — Assessment & Plan Note (Addendum)
Seems to be caused by DUB. Continue ferrous sulfate 325 mg daily. Further recommendations according to CBC. Colonoscopy has not been done yet but according to pt, Dr Man is planning on doing it after gyn problems have resolved.

## 2021-04-23 NOTE — Assessment & Plan Note (Addendum)
Mild. We discussed possible etiologies. Amlodipine could be aggravating problem. Continue lower extremity elevation, compression stockings will also help. For now I am not recommending diuretics; also she is concerned about their possible side effects (electrolyte abnormalities/hyperglycemia).

## 2021-04-23 NOTE — Progress Notes (Signed)
[name] is a 50 y.o.female, who is here today for follow up.  Last follow up visit: 02/12/21. Since her last visit she has seen Dr. Loanne Bates and her gyn.  Hypertension: Dx'ed 01/2021.  Medications: amlodipine 5 mg daily. Chlorthalidone was discontinued because she was concerned about risk of diabetes. BP readings at home:120-130's/70's. Side effects:Mild LE edema. Negative for unusual or severe headache, visual changes, exertional chest pain, dyspnea, or focal weakness. She has noted some peri ankle edema,worse a few days after starting Amlodipine.it is more noticeable at the end of the day, and better with elevation. No LE erythema or pain.  Lab Results  Component Value Date   CREATININE 0.51 05/21/2020   BUN 17 05/21/2020   NA 142 05/21/2020   K 4.4 05/21/2020   CL 108 05/21/2020   CO2 23 05/21/2020   Early this week she had pain while urination ,burning,and odorous urine. +Urinary frequency. Negative for fever, chills, pelvic pain, or gross hematuria.  Azo and increasing fluid intake helped, dysuria has resolved.  For several months she has intermittent vaginal spotting in between periods, brownish and pinkish discharge. According to pt, hysterectomy is being considered.  She would like to have anemia checked. + Fatigue and pica for ice. She has not noted melena, abdominal pain, changes in bowel habits, N/V, or blood in the stool.  She is following with gynecology. Pending a pelvic/transvaginal US.  Lab Results  Component Value Date   WBC 5.0 10/26/2020   HGB 10.6 (L) 10/26/2020   HCT 35.2 10/26/2020   MCV 80.5 10/26/2020   PLT 209 10/26/2020   Review of Systems  Constitutional:  Negative for activity change and appetite change.  HENT:  Negative for nosebleeds and sore throat.   Respiratory:  Negative for cough and wheezing.   Endocrine: Negative for cold intolerance and heat intolerance.  Genitourinary:  Positive for menstrual problem.  Negative for flank pain.  Musculoskeletal:  Negative for gait problem and myalgias.  Neurological:  Negative for syncope and facial asymmetry.  Rest see pertinent positives and negatives per HPI.  Current Outpatient Medications on File Prior to Visit  Medication Sig Dispense Refill   amLODipine (NORVASC) 5 MG tablet Take 1 tablet (5 mg total) by mouth daily. 30 tablet 3   BIOTIN PO Take 1 capsule by mouth daily.     ferrous sulfate 325 (65 FE) MG tablet Take 1 tablet (325 mg total) by mouth daily. 90 tablet 1   Iron-FA-B Cmp-C-Biot-Probiotic (FUSION PLUS) CAPS Take 1 capsule by mouth daily.  3   levocetirizine (XYZAL) 5 MG tablet TAKE 1 TABLET BY MOUTH EVERY DAY IN THE EVENING 30 tablet 8   levothyroxine (SYNTHROID) 25 MCG tablet Take 1 tablet (25 mcg total) by mouth daily. 90 tablet 3   mometasone (NASONEX) 50 MCG/ACT nasal spray Place 2 sprays into the nose daily. 17 g 3   montelukast (SINGULAIR) 10 MG tablet Take 1 tablet (10 mg total) by mouth at bedtime. 90 tablet 1   tranexamic acid (LYSTEDA) 650 MG TABS tablet Take 1,300 mg by mouth 3 (three) times daily.     valACYclovir (VALTREX) 500 MG tablet Take 500 mg by mouth daily.     Vitamin D, Ergocalciferol, (DRISDOL) 1.25 MG (50000 UNIT) CAPS capsule TAKE 1 CAPSULE (50,000 UNITS TOTAL) BY MOUTH EVERY 7 (SEVEN) DAYS. 12 capsule 3   No current facility-administered medications on file prior to visit.   Past Medical History:  Diagnosis Date  Fibroids    Seasonal allergies    Allergies  Allergen Reactions   Latex Itching   Social History   Socioeconomic History   Marital status: Married    Spouse name: Not on file   Number of children: Not on file   Years of education: Not on file   Highest education level: Not on file  Occupational History   Not on file  Tobacco Use   Smoking status: Never   Smokeless tobacco: Never  Substance and Sexual Activity   Alcohol use: No   Drug use: No   Sexual activity: Yes    Partners: Male     Birth control/protection: Surgical    Comment: BTL   Other Topics Concern   Not on file  Social History Narrative   Not on file   Social Determinants of Health   Financial Resource Strain: Not on file  Food Insecurity: Not on file  Transportation Needs: Not on file  Physical Activity: Not on file  Stress: Not on file  Social Connections: Not on file   Vitals:   04/23/21 1431  BP: 124/70  Pulse: 63  Resp: 16  SpO2: 98%   Body mass index is 24.27 kg/m.  Physical Exam Vitals and nursing note reviewed.  Constitutional:      General: She is not in acute distress.    Appearance: She is well-developed.  HENT:     Head: Normocephalic and atraumatic.     Mouth/Throat:     Mouth: Mucous membranes are moist.     Pharynx: Oropharynx is clear.  Eyes:     Conjunctiva/sclera: Conjunctivae normal.  Cardiovascular:     Rate and Rhythm: Normal rate and regular rhythm.     Pulses:          Dorsalis pedis pulses are 2+ on the right side and 2+ on the left side.     Heart sounds: No murmur heard. Pulmonary:     Effort: Pulmonary effort is normal. No respiratory distress.     Breath sounds: Normal breath sounds.  Abdominal:     Palpations: Abdomen is soft. There is no hepatomegaly or mass.     Tenderness: There is no abdominal tenderness.  Genitourinary:    Comments: Deferred to gyn. Lymphadenopathy:     Cervical: No cervical adenopathy.  Skin:    General: Skin is warm.     Findings: No erythema or rash.  Neurological:     General: No focal deficit present.     Mental Status: She is alert and oriented to person, place, and time.     Cranial Nerves: No cranial nerve deficit.     Gait: Gait normal.  Psychiatric:     Comments: Well groomed, good eye contact.   ASSESSMENT AND PLAN:   [name] was seen today for follow-up.  Diagnoses and all orders for this visit: Orders Placed This Encounter  Procedures   Heplisav-B (HepB-CPG) Vaccine   BMP with eGFR([facility])    CBC   Urinalysis with Culture Reflex   Lab Results  Component Value Date   WBC 5.9 04/23/2021   HGB 10.5 (L) 04/23/2021   HCT 35.8 04/23/2021   MCV 74.7 (L) 04/23/2021   PLT 208 04/23/2021   Lab Results  Component Value Date   CREATININE 0.78 04/23/2021   BUN 15 04/23/2021   NA 138 04/23/2021   K 3.5 04/23/2021   CL 102 04/23/2021   CO2 26 04/23/2021   Dysuria Resolved. We discussed possible etiologies. Continue  adequate hydration. Further recommendation will be given according to UA and urine culture.  Anemia Seems to be caused by DUB. Continue ferrous sulfate 325 mg daily. Further recommendations according to CBC. Colonoscopy has not been done yet but according to pt, Dr Man is planning on doing it after gyn problems have resolved.  Primary hypertension BP adequately controlled. Continue current management: Amlodipine 5 mg daily.We discussed some side effects of medication. DASH/low salt diet recommended. Monitor BP at home. Eye exam is current.  Bilateral lower extremity edema Mild. We discussed possible etiologies. Amlodipine could be aggravating problem. Continue lower extremity elevation, compression stockings will also help. For now I am not recommending diuretics; also she is concerned about their possible side effects (electrolyte abnormalities/hyperglycemia).  Need for hepatitis B vaccination -     Heplisav-B (HepB-CPG) Vaccine  DUB (dysfunctional uterine bleeding) Continue following with gyn.  I spent a total of 40 minutes in both face to face and non face to face activities for this visit on the date of this encounter. During this time history was obtained and documented, examination was performed, prior labs reviewed, and assessment/plan discussed.  Return in about 5 months (around 09/21/2021) for cpe.  Keajah Killough G. Martinique, MD  [facility]. [place] office.

## 2021-04-23 NOTE — Assessment & Plan Note (Addendum)
BP adequately controlled. Continue current management: Amlodipine 5 mg daily.We discussed some side effects of medication. DASH/low salt diet recommended. Monitor BP at home. Eye exam is current.

## 2021-04-23 NOTE — Patient Instructions (Addendum)
A few things to remember from today's visit:  Primary hypertension - Plan: BMP with eGFR([facility]), CBC  Iron deficiency anemia due to chronic blood loss - Plan: CBC  Bilateral lower extremity edema  Dysuria - Plan: Urinalysis with Culture Reflex  If you need refills please call your pharmacy. Do not use My Chart to request refills or for acute issues that need immediate attention.   Continue lower extremities elevation a few times in the afternoon. Compression stockings will also help. No changes in medications today.   Please be sure medication list is accurate. If a new problem present, please set up appointment sooner than planned today.

## 2021-04-24 LAB — CBC
HCT: 35.8 % (ref 35.0–45.0)
Hemoglobin: 10.5 g/dL — ABNORMAL LOW (ref 11.7–15.5)
MCH: 21.9 pg — ABNORMAL LOW (ref 27.0–33.0)
MCHC: 29.3 g/dL — ABNORMAL LOW (ref 32.0–36.0)
MCV: 74.7 fL — ABNORMAL LOW (ref 80.0–100.0)
MPV: 11 fL (ref 7.5–12.5)
Platelets: 208 10*3/uL (ref 140–400)
RBC: 4.79 10*6/uL (ref 3.80–5.10)
RDW: 17.7 % — ABNORMAL HIGH (ref 11.0–15.0)
WBC: 5.9 10*3/uL (ref 3.8–10.8)

## 2021-04-24 LAB — URINALYSIS W MICROSCOPIC + REFLEX CULTURE
Bacteria, UA: NONE SEEN /HPF
Bilirubin Urine: NEGATIVE
Glucose, UA: NEGATIVE
Hgb urine dipstick: NEGATIVE
Hyaline Cast: NONE SEEN /LPF
Ketones, ur: NEGATIVE
Leukocyte Esterase: NEGATIVE
Nitrites, Initial: NEGATIVE
Protein, ur: NEGATIVE
RBC / HPF: NONE SEEN /HPF (ref 0–2)
Specific Gravity, Urine: 1.006 (ref 1.001–1.035)
Squamous Epithelial / HPF: NONE SEEN /HPF (ref <=5)
WBC, UA: NONE SEEN /HPF (ref 0–5)
pH: 7 (ref 5.0–8.0)

## 2021-04-24 LAB — BASIC METABOLIC PANEL WITH GFR
BUN: 15 mg/dL (ref 7–25)
CO2: 26 mmol/L (ref 20–32)
Calcium: 9.7 mg/dL (ref 8.6–10.2)
Chloride: 102 mmol/L (ref 98–110)
Creat: 0.78 mg/dL (ref 0.50–0.99)
Glucose, Bld: 73 mg/dL (ref 65–99)
Potassium: 3.5 mmol/L (ref 3.5–5.3)
Sodium: 138 mmol/L (ref 135–146)
eGFR: 94 mL/min/{1.73_m2} (ref >=60)

## 2021-04-24 LAB — NO CULTURE INDICATED

## 2021-04-28 ENCOUNTER — Other Ambulatory Visit: Payer: Self-pay | Admitting: Obstetrics and Gynecology

## 2021-06-05 ENCOUNTER — Other Ambulatory Visit: Payer: Self-pay | Admitting: Family Medicine

## 2021-06-05 DIAGNOSIS — D500 Iron deficiency anemia secondary to blood loss (chronic): Secondary | ICD-10-CM

## 2021-06-28 ENCOUNTER — Other Ambulatory Visit: Payer: BC Managed Care – PPO

## 2021-07-16 ENCOUNTER — Ambulatory Visit: Payer: BC Managed Care – PPO | Admitting: Endocrinology

## 2021-07-19 ENCOUNTER — Other Ambulatory Visit: Payer: Self-pay | Admitting: Family Medicine

## 2021-07-28 ENCOUNTER — Other Ambulatory Visit: Payer: Self-pay

## 2021-07-28 ENCOUNTER — Encounter: Payer: Self-pay | Admitting: Endocrinology

## 2021-07-28 ENCOUNTER — Ambulatory Visit: Payer: BC Managed Care – PPO | Admitting: Endocrinology

## 2021-07-28 DIAGNOSIS — E039 Hypothyroidism, unspecified: Secondary | ICD-10-CM | POA: Insufficient documentation

## 2021-07-28 DIAGNOSIS — N924 Excessive bleeding in the premenopausal period: Secondary | ICD-10-CM

## 2021-07-28 DIAGNOSIS — E890 Postprocedural hypothyroidism: Secondary | ICD-10-CM

## 2021-07-28 DIAGNOSIS — N920 Excessive and frequent menstruation with regular cycle: Secondary | ICD-10-CM | POA: Insufficient documentation

## 2021-07-28 NOTE — Patient Instructions (Signed)
Blood tests are requested for you today.  We'll let you know about the results.  Please come back for a follow-up appointment in 3 months.   

## 2021-07-28 NOTE — Progress Notes (Signed)
Subjective:    Patient ID: Dawn Bates, female    DOB: 06/16/1971, 51 y.o.   MRN: [id number]  HPI Pt returns for f/u of post-RAI hypothyroidism (dx'ed 2021; US showed this and small nodule that did not need bx; she had RAI 2/22; she started synthroid 5/22; opthal did not recommend any rx for opthalmopathy).  She reports ongoing fatigue.  She will have TAH next month, for menorrhagia.  She takes synthroid as rx'ed.  She also takes fe 1/d.   Past Medical History:  Diagnosis Date   Fibroids    Seasonal allergies     Past Surgical History:  Procedure Laterality Date   TUBAL LIGATION      Social History   Socioeconomic History   Marital status: Married    Spouse name: Not on file   Number of children: Not on file   Years of education: Not on file   Highest education level: Not on file  Occupational History   Not on file  Tobacco Use   Smoking status: Never   Smokeless tobacco: Never  Substance and Sexual Activity   Alcohol use: No   Drug use: No   Sexual activity: Yes    Partners: Male    Birth control/protection: Surgical    Comment: BTL   Other Topics Concern   Not on file  Social History Narrative   Not on file   Social Determinants of Health   Financial Resource Strain: Not on file  Food Insecurity: Not on file  Transportation Needs: Not on file  Physical Activity: Not on file  Stress: Not on file  Social Connections: Not on file  Intimate Partner Violence: Not on file    Current Outpatient Medications on File Prior to Visit  Medication Sig Dispense Refill   amLODipine (NORVASC) 5 MG tablet TAKE 1 TABLET (5 MG TOTAL) BY MOUTH DAILY. 90 tablet 1   ferrous sulfate 325 (65 FE) MG tablet TAKE 1 TABLET BY MOUTH EVERY DAY 90 tablet 1   Iron-FA-B Cmp-C-Biot-Probiotic (FUSION PLUS) CAPS Take 1 capsule by mouth daily.  3   levocetirizine (XYZAL) 5 MG tablet TAKE 1 TABLET BY MOUTH EVERY DAY IN THE EVENING 30 tablet 8   levothyroxine (SYNTHROID) 25 MCG  tablet Take 1 tablet (25 mcg total) by mouth daily. 90 tablet 3   mometasone (NASONEX) 50 MCG/ACT nasal spray Place 2 sprays into the nose daily. 17 g 3   Vitamin D, Ergocalciferol, (DRISDOL) 1.25 MG (50000 UNIT) CAPS capsule TAKE 1 CAPSULE (50,000 UNITS TOTAL) BY MOUTH EVERY 7 (SEVEN) DAYS. 12 capsule 3   BIOTIN PO Take 1 capsule by mouth daily. (Patient not taking: Reported on 07/28/2021)     montelukast (SINGULAIR) 10 MG tablet Take 1 tablet (10 mg total) by mouth at bedtime. (Patient not taking: Reported on 07/28/2021) 90 tablet 1   tranexamic acid (LYSTEDA) 650 MG TABS tablet Take 1,300 mg by mouth 3 (three) times daily. (Patient not taking: Reported on 07/28/2021)     valACYclovir (VALTREX) 500 MG tablet Take 500 mg by mouth daily. (Patient not taking: Reported on 07/28/2021)     No current facility-administered medications on file prior to visit.    Allergies  Allergen Reactions   Latex Itching    Family History  Problem Relation Age of Onset   Cancer Sister 46       breast "onset Late 20's"   Breast cancer Sister    Thyroid cancer Sister  pt does not know cell type   Breast cancer Maternal Aunt     BP 120/82 (BP Location: Left Arm, Patient Position: Sitting, Cuff Size: Normal)    Pulse 67    Ht 5\' 6"  (1.676 m)    Wt 149 lb 9.6 oz (67.9 kg)    SpO2 97%    BMI 24.15 kg/m    Review of Systems     Objective:   Physical Exam VITAL SIGNS:  See vs page GENERAL: no distress NECK: There is no palpable thyroid enlargement.  No thyroid nodule is palpable.  No palpable lymphadenopathy at the anterior neck.   Lab Results  Component Value Date   TSH 1.85 07/28/2021      Assessment & Plan:  Hypothyroidism: well-controlled.  Please continue the same synthroid.  Menorrhagia: check cbc and fe

## 2021-07-29 LAB — T4, FREE: Free T4: 1 ng/dL (ref 0.8–1.8)

## 2021-07-29 LAB — HEMOGLOBIN AND HEMATOCRIT, BLOOD
HCT: 35.3 % (ref 35.0–45.0)
Hemoglobin: 10.4 g/dL — ABNORMAL LOW (ref 11.7–15.5)

## 2021-07-29 LAB — TSH: TSH: 1.85 mIU/L

## 2021-07-29 LAB — IRON: Iron: 26 ug/dL — ABNORMAL LOW (ref 40–190)

## 2021-08-11 ENCOUNTER — Other Ambulatory Visit: Payer: Self-pay | Admitting: Family Medicine

## 2021-08-11 DIAGNOSIS — J301 Allergic rhinitis due to pollen: Secondary | ICD-10-CM

## 2021-08-12 ENCOUNTER — Other Ambulatory Visit: Payer: Self-pay | Admitting: Obstetrics and Gynecology

## 2021-09-16 ENCOUNTER — Encounter (HOSPITAL_BASED_OUTPATIENT_CLINIC_OR_DEPARTMENT_OTHER): Payer: Self-pay | Admitting: Obstetrics and Gynecology

## 2021-09-16 ENCOUNTER — Other Ambulatory Visit: Payer: Self-pay

## 2021-09-16 NOTE — Progress Notes (Signed)
Spoke w/ via phone for pre-op interview---Dawn Bates ?Lab needs dos---- urine pregnancy POCT              ?Lab results------09/28/20 lab appt for cbc, type & screen, BMP, EKG ?COVID test -----patient states asymptomatic no test needed ?Arrive at -------1030 on Monday, 10/04/21 ?NPO after MN NO Solid Food.  Clear liquids from MN until---0930 ?Med rec completed ?Medications to take morning of surgery -----Xyzal, Valtrex prn ?Diabetic medication -----n/a ?Patient instructed no nail polish to be worn day of surgery ?Patient instructed to bring photo id and insurance card day of surgery ?Patient aware to have Driver (ride ) / caregiver    for 24 hours after surgery - husband, Dawn Bates  ?Patient Special Instructions -----Extended recovery instructions given. ?Pre-Op special Istructions -----none ?Patient verbalized understanding of instructions that were given at this phone interview. ?Patient denies shortness of breath, chest pain, fever, cough at this phone interview.  ? ?Per pt on 09/16/21, she has severe allergic rhinitis all year long. She has a runny nose, congestion, and cough from drainage if she lies on her back. She normally sleeps with extra pillows under her head at night. ?

## 2021-09-16 NOTE — Progress Notes (Signed)
Your procedure is scheduled on Monday, 10/04/21. ? Report to [place] ? Call this number if you have problems the morning of surgery  :[phone]. ? ? [place].  WE ARE LOCATED IN THE [facility]. ? ?PLEASE BRING YOUR INSURANCE CARD AND PHOTO ID DAY OF SURGERY. ? ?ONLY 2 PEOPLE ARE ALLOWED IN  WAITING  ROOM.  ?                                   ? REMEMBER: ? DO NOT EAT FOOD, CANDY GUM OR MINTS  AFTER MIDNIGHT THE NIGHT BEFORE YOUR SURGERY . YOU MAY HAVE CLEAR LIQUIDS FROM MIDNIGHT THE NIGHT BEFORE YOUR SURGERY UNTIL  9:30 am. NO CLEAR LIQUIDS AFTER  9:30 am DAY OF SURGERY. ? ?YOU MAY  BRUSH YOUR TEETH MORNING OF SURGERY AND RINSE YOUR MOUTH OUT, NO CHEWING GUM CANDY OR MINTS. ? ? ? ? ?CLEAR LIQUID DIET ? ? ?Foods Allowed                                                                     Foods Excluded ? ?Coffee and tea, regular and decaf                             liquids that you cannot  ?Plain Jell-O any favor except red or purple                                           see through such as: ?Fruit ices (not with fruit pulp)                                     milk, soups, orange juice  ?Iced Popsicles                                    All solid food ?Carbonated beverages, regular and diet                                    ?Cranberry, grape and apple juices ?Sports drinks like Gatorade ? ?Sample Menu ?Breakfast                                Lunch                                     Supper ?Cranberry juice                                           ?  Jell-O                                     Grape juice                           Apple juice ?Coffee or tea                        Jell-O                                      Popsicle ?                                               Coffee or tea                        Coffee or tea ? ?_____________________________________________________________________ ?  ? ? TAKE THESE MEDICATIONS  MORNING OF SURGERY WITH A SIP OF WATER:  Xyzal, Valtrex if needed ? ?TWO VISITORS ARE  ALLOWED IN WAITING ROOM ONLY DAY OF SURGERY.   ? ? UP TO 4 VISITORS  MAY VISIT IN THE EXTENDED RECOVERY ROOM UNTIL 800 PM ONLY.  1 VISITOR AGE 49 AND OVER MAY SPEND THE NIGHT AND MUST BE IN EXTENDED RECOVERY ROOM NO LATER THAN 800 PM . YOUR DISCHARGE TIME AFTER YOU SPEND THE NIGHT IS 900 AM THE MORNING AFTER YOUR SURGERY. ? ?YOU MAY PACK A SMALL OVERNIGHT BAG WITH TOILETRIES FOR YOUR OVERNIGHT STAY IF YOU WISH. ? ?YOUR PRESCRIPTION MEDICATIONS WILL BE PROVIDED DURING [place]. ? ?                                   ?DO NOT WEAR JEWERLY, MAKE UP. ?DO NOT WEAR LOTIONS, POWDERS, PERFUMES OR NAIL POLISH ON YOUR FINGERNAILS. TOENAIL POLISH IS OK TO WEAR. ?DO NOT SHAVE FOR 48 HOURS PRIOR TO DAY OF SURGERY. ?MEN MAY SHAVE FACE AND NECK. ?CONTACTS, GLASSES, OR DENTURES MAY NOT BE WORN TO SURGERY. ? ?REMEMBER: NO SMOKING, DRUGS OR ALCOHOL FOR 24 HOURS BEFORE YOUR SURGERY. ?                                   ? IS NOT RESPONSIBLE  FOR ANY BELONGINGS.                                  ?                                  . ?          [facility] - Preparing for Surgery ?Before surgery, you can play an important role.  Because skin is not sterile, your skin needs to be as free of germs as possible.  You can reduce the number of germs on your skin by washing with CHG (  chlorahexidine gluconate) soap before surgery.  CHG is an antiseptic cleaner which kills germs and bonds with the skin to continue killing germs even after washing. ?Please DO NOT use if you have an allergy to CHG or antibacterial soaps.  If your skin becomes reddened/irritated stop using the CHG and inform your nurse when you arrive at Short Stay. ?Do not shave (including legs and underarms) for at least 48 hours prior to the first CHG shower.  You may shave your face/neck. ?Please follow these instructions carefully: ? 1.  Shower with CHG Soap the night before  surgery and the  morning of Surgery. ? 2.  If you choose to wash your hair, wash your hair first as usual with your  normal  shampoo. ? 3.  After you shampoo, rinse your hair and body thoroughly to remove the  shampoo.                            ?4.  Use CHG as you would any other liquid soap.  You can apply chg directly  to the skin and wash  ?                    Gently with a scrungie or clean washcloth. ? 5.  Apply the CHG Soap to your body ONLY FROM THE NECK DOWN.   Do not use on face/ open      ?                     Wound or open sores. Avoid contact with eyes, ears mouth and genitals (private parts).  ?                     Production manager,  Genitals (private parts) with your normal soap. ?            6.  Wash thoroughly, paying special attention to the area where your surgery  will be performed. ? 7.  Thoroughly rinse your body with warm water from the neck down. ? 8.  DO NOT shower/wash with your normal soap after using and rinsing off  the CHG Soap. ?               9.  Pat yourself dry with a clean towel. ?           10.  Wear clean pajamas. ?           11.  Place clean sheets on your bed the night of your first shower and do not  sleep with pets. ?Day of Surgery : ?Do not apply any lotions/deodorants the morning of surgery.  Please wear clean clothes to the hospital/surgery center. ? ?IF YOU HAVE ANY SKIN IRRITATION OR PROBLEMS WITH THE SURGICAL SOAP, PLEASE GET A BAR OF GOLD DIAL SOAP AND SHOWER THE NIGHT BEFORE YOUR SURGERY AND THE MORNING OF YOUR SURGERY. PLEASE LET THE NURSE KNOW MORNING OF YOUR SURGERY IF YOU HAD ANY PROBLEMS WITH THE SURGICAL SOAP. ? ? ?________________________________________________________________________                  ?                                    ?  QUESTIONS CALL [facility] PRE OP NURSE PHONE [phone].                                    ?

## 2021-09-20 ENCOUNTER — Other Ambulatory Visit: Payer: Self-pay | Admitting: Family Medicine

## 2021-09-24 ENCOUNTER — Encounter: Payer: BC Managed Care – PPO | Admitting: Family Medicine

## 2021-09-27 NOTE — Progress Notes (Signed)
? ?HPI: ?[name] is a 51 y.o. female, who is here today for her routine physical. ? ?Last CPE: 08/2018. ? ?Regular exercise: Dancing,walking,and jumping the robe. ?Following a healthful diet: Cooks at home, fried fish or chicken livers 2 times per week. Salads, fish,bake chicken,and spaghetti. ? ?Chronic medical problems: Vitamin D deficiency, thyroid nodule, hypertension, hypothyroidism, and non-seasonal allergies among some. ? ?Immunization History  ?Administered Date(s) Administered  ? Hepb-cpg 04/23/2021  ? Tdap 06/20/2016  ? ?Health Maintenance  ?Topic Date Due  ? COLONOSCOPY (Pts 45-81yr Insurance coverage will need to be confirmed)  Never done  ? INFLUENZA VACCINE  01/18/2022  ? PAP SMEAR-Modifier  11/04/2024  ? TETANUS/TDAP  06/20/2026  ? Hepatitis C Screening  Completed  ? HIV Screening  Completed  ? HPV VACCINES  Aged Out  ? COVID-19 Vaccine  Discontinued  ?She has seen Dawn Bates colon cancer screening was discussed, pending scheduling colonoscopy. ? ?She has no concerns today. ?HLD on non pharmacologic treatment. ? ?Lab Results  ?Component Value Date  ? CHOL 186 02/12/2021  ? HDL 69 02/12/2021  ? LDLCALC 101 (H) 02/12/2021  ? TRIG 75 02/12/2021  ? CHOLHDL 2.7 02/12/2021  ? ?Hypothyroidism: Follows with Dawn Dawn Bates 6-12 months. ? ?Lab Results  ?Component Value Date  ? TSH 1.85 07/28/2021  ? ?HTN on Amlodipine 5 mg daily. ?Lab Results  ?Component Value Date  ? CREATININE 0.63 09/28/2021  ? BUN 16 09/28/2021  ? NA 139 09/28/2021  ? K 3.5 09/28/2021  ? CL 107 09/28/2021  ? CO2 26 09/28/2021  ? ?Vit D def: She is on Ergocalciferol 50,000 U weekly. ? ?Planning on laparoscopic hysterectomy on 10/04/21. ? ?Review of Systems  ?Constitutional:  Negative for appetite change and fever.  ?HENT:  Negative for hearing loss, mouth sores, sore throat, trouble swallowing and voice change.   ?Eyes:  Negative for redness and visual disturbance.  ?Respiratory:  Negative for cough, shortness of breath and  wheezing.   ?Cardiovascular:  Negative for chest pain and leg swelling.  ?Gastrointestinal:  Negative for abdominal pain, nausea and vomiting.  ?     No changes in bowel habits.  ?Endocrine: Negative for cold intolerance, heat intolerance, polydipsia, polyphagia and polyuria.  ?Genitourinary:  Positive for menstrual problem. Negative for decreased urine volume, dysuria, hematuria, vaginal bleeding and vaginal discharge.  ?Musculoskeletal:  Negative for gait problem and myalgias.  ?Skin:  Negative for color change and rash.  ?Allergic/Immunologic: Positive for environmental allergies.  ?Neurological:  Negative for syncope, weakness and headaches.  ?Hematological:  Negative for adenopathy. Does not bruise/bleed easily.  ?Psychiatric/Behavioral:  Negative for confusion. The patient is not nervous/anxious.   ?All other systems reviewed and are negative. ? ?Current Outpatient Medications on File Prior to Visit  ?Medication Sig Dispense Refill  ? amLODipine (NORVASC) 5 MG tablet TAKE 1 TABLET (5 MG TOTAL) BY MOUTH DAILY. (Patient taking differently: Take 5 mg by mouth at bedtime.) 90 tablet 1  ? ferrous sulfate 325 (65 FE) MG tablet TAKE 1 TABLET BY MOUTH EVERY DAY 90 tablet 1  ? Iron-FA-B Cmp-C-Biot-Probiotic (FUSION PLUS) CAPS Take 1 capsule by mouth daily.  3  ? levocetirizine (XYZAL) 5 MG tablet TAKE 1 TABLET BY MOUTH EVERY DAY IN THE EVENING 90 tablet 2  ? levothyroxine (SYNTHROID) 25 MCG tablet Take 1 tablet (25 mcg total) by mouth daily. 90 tablet 3  ? mometasone (NASONEX) 50 MCG/ACT nasal spray Place 2 sprays into the nose daily. 17 g  3  ? valACYclovir (VALTREX) 500 MG tablet Take 500 mg by mouth as needed.    ? Vitamin D, Ergocalciferol, (DRISDOL) 1.25 MG (50000 UNIT) CAPS capsule TAKE 1 CAPSULE (50,000 UNITS TOTAL) BY MOUTH EVERY 7 (SEVEN) DAYS 12 capsule 0  ? ?No current facility-administered medications on file prior to visit.  ? ?Past Medical History:  ?Diagnosis Date  ? Abnormal uterine bleeding 2022  ?  Anemia 02/12/2021  ? Patient is taking iron supplementation.  ? COVID-19 2020  ? cold-like symptoms  ? Endometriosis   ? Fibroids   ? Graves disease   ? Hypertension   ? Hyperthyroidism 2022  ? s/p Nuclear Medicine RAI therapy on 08/07/20  ? Seasonal allergies   ? severe chronic allergies, runny nose, congestion, cough when she lays down due to drainage  ? Vitamin D deficiency 2021  ? Wears glasses   ? ? ?Past Surgical History:  ?Procedure Laterality Date  ? TUBAL LIGATION  09/2008  ? ? ?Allergies  ?Allergen Reactions  ? Latex Itching  ? ? ?Family History  ?Problem Relation Age of Onset  ? Cancer Sister 19  ?     breast "onset Late 20's"  ? Breast cancer Sister   ? Thyroid cancer Sister   ?     pt does not know cell type  ? Breast cancer Maternal Aunt   ? ? ?Social History  ? ?Socioeconomic History  ? Marital status: Married  ?  Spouse name: Not on file  ? Number of children: Not on file  ? Years of education: Not on file  ? Highest education level: Not on file  ?Occupational History  ? Not on file  ?Tobacco Use  ? Smoking status: Never  ? Smokeless tobacco: Never  ?Vaping Use  ? Vaping Use: Never used  ?Substance and Sexual Activity  ? Alcohol use: No  ? Drug use: No  ? Sexual activity: Yes  ?  Partners: Male  ?  Birth control/protection: Surgical  ?  Comment: BTL   ?Other Topics Concern  ? Not on file  ?Social History Narrative  ? Not on file  ? ?Social Determinants of Health  ? ?Financial Resource Strain: Not on file  ?Food Insecurity: Not on file  ?Transportation Needs: Not on file  ?Physical Activity: Not on file  ?Stress: Not on file  ?Social Connections: Not on file  ? ?Vitals:  ? 09/29/21 0910  ?BP: 126/80  ?Pulse: 63  ?Resp: 16  ?Temp: 98.4 ?F (36.9 ?C)  ?SpO2: 99%  ? ?Body mass index is 24.13 kg/m?. ? ?Wt Readings from Last 3 Encounters:  ?09/29/21 149 lb 8 oz (67.8 kg)  ?09/28/21 150 lb (68 kg)  ?07/28/21 149 lb 9.6 oz (67.9 kg)  ? ?Physical Exam ?Vitals and nursing note reviewed.  ?Constitutional:   ?    Appearance: Normal appearance. She is normal weight.  ?HENT:  ?   Head: Normocephalic and atraumatic.  ?   Right Ear: External ear normal.  ?   Left Ear: External ear normal.  ?   Ears:  ?   Comments: Cerumen excess, could not see TM's. ?   Nose: Nose normal.  ?   Mouth/Throat:  ?   Mouth: Mucous membranes are moist.  ?   Pharynx: Oropharynx is clear.  ?Eyes:  ?   Extraocular Movements: Extraocular movements intact.  ?   Conjunctiva/sclera: Conjunctivae normal.  ?   Pupils: Pupils are equal, round, and reactive to light.  ?Cardiovascular:  ?  Rate and Rhythm: Normal rate and regular rhythm.  ?   Pulses: Normal pulses.  ?   Heart sounds: Normal heart sounds.  ?Pulmonary:  ?   Effort: Pulmonary effort is normal.  ?   Breath sounds: Normal breath sounds.  ?Abdominal:  ?   General: Abdomen is flat. Bowel sounds are normal.  ?   Palpations: Abdomen is soft.  ?Musculoskeletal:     ?   General: Normal range of motion.  ?   Cervical back: Normal range of motion and neck supple.  ?Skin: ?   General: Skin is warm.  ?Neurological:  ?   Mental Status: She is alert and oriented to person, place, and time.  ?Psychiatric:     ?   Mood and Affect: Mood and affect normal.  ? ?ASSESSMENT AND PLAN: ? ?[name] was here today annual physical examination. ? ?Orders Placed This Encounter  ?Procedures  ? Lipid Panel  ? Vitamin D, 25-hydroxy  ? ?Lab Results  ?Component Value Date  ? CHOL 217 (H) 09/29/2021  ? HDL 73 09/29/2021  ? LDLCALC 123 (H) 09/29/2021  ? TRIG 105 09/29/2021  ? CHOLHDL 3.0 09/29/2021  ? ?Routine general medical examination at a health care facility ?We discussed the importance of regular physical activity and healthy diet for prevention of chronic illness and/or complications. ?Preventive guidelines reviewed. ?Vaccination up to date. ?Continue female care with her gyn. ?Colon cancer screening has been discussed with her GI, Dawn Collene Bates, planning on colonoscopy after recovering from hysterectomy. ?Next CPE  in a year. ?The 10-year ASCVD risk score (Arnett DK, et al., 2019) is: 3.8% ?  Values used to calculate the score: ?    Age: 84 years ?    Sex: Female ?    Is Non-Hispanic African American: Yes ?    Diabet

## 2021-09-28 ENCOUNTER — Encounter (HOSPITAL_COMMUNITY)
Admission: RE | Admit: 2021-09-28 | Discharge: 2021-09-28 | Disposition: A | Discharge status: Home / Self Care | Payer: BC Managed Care – PPO | Source: Ambulatory Visit | Attending: Obstetrics and Gynecology | Admitting: Obstetrics and Gynecology

## 2021-09-28 DIAGNOSIS — Z01818 Encounter for other preprocedural examination: Secondary | ICD-10-CM | POA: Diagnosis present

## 2021-09-28 LAB — BASIC METABOLIC PANEL
Anion gap: 6 (ref 5–15)
BUN: 16 mg/dL (ref 6–20)
CO2: 26 mmol/L (ref 22–32)
Calcium: 9.3 mg/dL (ref 8.9–10.3)
Chloride: 107 mmol/L (ref 98–111)
Creatinine, Ser: 0.63 mg/dL (ref 0.44–1.00)
GFR, Estimated: >60 mL/min (ref >60)
Glucose, Bld: 90 mg/dL (ref 70–99)
Potassium: 3.5 mmol/L (ref 3.5–5.1)
Sodium: 139 mmol/L (ref 135–145)

## 2021-09-28 LAB — CBC
HCT: 35 % — ABNORMAL LOW (ref 36.0–46.0)
Hemoglobin: 10.3 g/dL — ABNORMAL LOW (ref 12.0–15.0)
MCH: 23.3 pg — ABNORMAL LOW (ref 26.0–34.0)
MCHC: 29.4 g/dL — ABNORMAL LOW (ref 30.0–36.0)
MCV: 79 fL — ABNORMAL LOW (ref 80.0–100.0)
Platelets: 194 10*3/uL (ref 150–400)
RBC: 4.43 MIL/uL (ref 3.87–5.11)
RDW: 19 % — ABNORMAL HIGH (ref 11.5–15.5)
WBC: 5 10*3/uL (ref 4.0–10.5)
nRBC: 0 % (ref 0.0–0.2)

## 2021-09-29 ENCOUNTER — Encounter: Payer: Self-pay | Admitting: Family Medicine

## 2021-09-29 ENCOUNTER — Other Ambulatory Visit: Payer: Self-pay | Admitting: Obstetrics and Gynecology

## 2021-09-29 ENCOUNTER — Ambulatory Visit (INDEPENDENT_AMBULATORY_CARE_PROVIDER_SITE_OTHER): Payer: BC Managed Care – PPO | Admitting: Family Medicine

## 2021-09-29 VITALS — BP 126/80 | HR 63 | Temp 98.4°F | Resp 16 | Ht 66.0 in | Wt 149.5 lb

## 2021-09-29 DIAGNOSIS — E559 Vitamin D deficiency, unspecified: Secondary | ICD-10-CM

## 2021-09-29 DIAGNOSIS — I10 Essential (primary) hypertension: Secondary | ICD-10-CM

## 2021-09-29 DIAGNOSIS — Z0000 Encounter for general adult medical examination without abnormal findings: Secondary | ICD-10-CM

## 2021-09-29 DIAGNOSIS — E785 Hyperlipidemia, unspecified: Secondary | ICD-10-CM | POA: Diagnosis not present

## 2021-09-29 NOTE — Patient Instructions (Addendum)
A few things to remember from today's visit: ? ?Hyperlipidemia, unspecified hyperlipidemia type - Plan: Lipid panel ? ?Primary hypertension ? ?Vitamin D deficiency, unspecified - Plan: VITAMIN D 25 Hydroxy (Vit-D Deficiency, Fractures) ? ?Routine general medical examination at a health care facility ? ?If you need refills please call your pharmacy. ?Do not use My Chart to request refills or for acute issues that need immediate attention. ?  ?Monitor blood pressure at home. ?If labs are stable, we can continue following annually. ? ?Please be sure medication list is accurate. ?If a new problem present, please set up appointment sooner than planned today. ? ? ?Health Maintenance, Female ?Adopting a healthy lifestyle and getting preventive care are important in promoting health and wellness. Ask your health care provider about: ?The right schedule for you to have regular tests and exams. ?Things you can do on your own to prevent diseases and keep yourself healthy. ?What should I know about diet, weight, and exercise? ?Eat a healthy diet ? ?Eat a diet that includes plenty of vegetables, fruits, low-fat dairy products, and lean protein. ?Do not eat a lot of foods that are high in solid fats, added sugars, or sodium. ?Maintain a healthy weight ?Body mass index (BMI) is used to identify weight problems. It estimates body fat based on height and weight. Your health care provider can help determine your BMI and help you achieve or maintain a healthy weight. ?Get regular exercise ?Get regular exercise. This is one of the most important things you can do for your health. Most adults should: ?Exercise for at least 150 minutes each week. The exercise should increase your heart rate and make you sweat (moderate-intensity exercise). ?Do strengthening exercises at least twice a week. This is in addition to the moderate-intensity exercise. ?Spend less time sitting. Even light physical activity can be beneficial. ?Watch cholesterol  and blood lipids ?Have your blood tested for lipids and cholesterol at 49 years of age, then have this test every 5 years. ?Have your cholesterol levels checked more often if: ?Your lipid or cholesterol levels are high. ?You are older than 49 years of age. ?You are at high risk for heart disease. ?What should I know about cancer screening? ?Depending on your health history and family history, you may need to have cancer screening at various ages. This may include screening for: ?Breast cancer. ?Cervical cancer. ?Colorectal cancer. ?Skin cancer. ?Lung cancer. ?What should I know about heart disease, diabetes, and high blood pressure? ?Blood pressure and heart disease ?High blood pressure causes heart disease and increases the risk of stroke. This is more likely to develop in people who have high blood pressure readings or are overweight. ?Have your blood pressure checked: ?Every 3-5 years if you are 66-57 years of age. ?Every year if you are 47 years old or older. ?Diabetes ?Have regular diabetes screenings. This checks your fasting blood sugar level. Have the screening done: ?Once every three years after age 24 if you are at a normal weight and have a low risk for diabetes. ?More often and at a younger age if you are overweight or have a high risk for diabetes. ?What should I know about preventing infection? ?Hepatitis B ?If you have a higher risk for hepatitis B, you should be screened for this virus. Talk with your health care provider to find out if you are at risk for hepatitis B infection. ?Hepatitis C ?Testing is recommended for: ?Everyone born from [birth year] through 1963. ?Anyone with known risk factors for hepatitis  C. ?Sexually transmitted infections (STIs) ?Get screened for STIs, including gonorrhea and chlamydia, if: ?You are sexually active and are younger than 49 years of age. ?You are older than 49 years of age and your health care provider tells you that you are at risk for this type of infection. ?Your  sexual activity has changed since you were last screened, and you are at increased risk for chlamydia or gonorrhea. Ask your health care provider if you are at risk. ?Ask your health care provider about whether you are at high risk for HIV. Your health care provider may recommend a prescription medicine to help prevent HIV infection. If you choose to take medicine to prevent HIV, you should first get tested for HIV. You should then be tested every 3 months for as long as you are taking the medicine. ?Pregnancy ?If you are about to stop having your period (premenopausal) and you may become pregnant, seek counseling before you get pregnant. ?Take 400 to 800 micrograms (mcg) of folic acid every day if you become pregnant. ?Ask for birth control (contraception) if you want to prevent pregnancy. ?Osteoporosis and menopause ?Osteoporosis is a disease in which the bones lose minerals and strength with aging. This can result in bone fractures. If you are 54 years old or older, or if you are at risk for osteoporosis and fractures, ask your health care provider if you should: ?Be screened for bone loss. ?Take a calcium or vitamin D supplement to lower your risk of fractures. ?Be given hormone replacement therapy (HRT) to treat symptoms of menopause. ?Follow these instructions at home: ?Alcohol use ?Do not drink alcohol if: ?Your health care provider tells you not to drink. ?You are pregnant, may be pregnant, or are planning to become pregnant. ?If you drink alcohol: ?Limit how much you have to: ?0-1 drink a day. ?Know how much alcohol is in your drink. In the [place] one drink equals one 12 oz bottle of beer (355 mL), one 5 oz glass of wine (148 mL), or one 1? oz glass of hard liquor (44 mL). ?Lifestyle ?Do not use any products that contain nicotine or tobacco. These products include cigarettes, chewing tobacco, and vaping devices, such as e-cigarettes. If you need help quitting, ask your health care provider. ?Do not use  street drugs. ?Do not share needles. ?Ask your health care provider for help if you need support or information about quitting drugs. ?General instructions ?Schedule regular health, dental, and eye exams. ?Stay current with your vaccines. ?Tell your health care provider if: ?You often feel depressed. ?You have ever been abused or do not feel safe at home. ?Summary ?Adopting a healthy lifestyle and getting preventive care are important in promoting health and wellness. ?Follow your health care provider's instructions about healthy diet, exercising, and getting tested or screened for diseases. ?Follow your health care provider's instructions on monitoring your cholesterol and blood pressure. ?This information is not intended to replace advice given to you by your health care provider. Make sure you discuss any questions you have with your health care provider. ?Document Revised: 10/26/2020 Document Reviewed: 10/26/2020 ?Elsevier Patient Education ? [place]. ? ?

## 2021-09-30 LAB — LIPID PANEL
Cholesterol: 217 mg/dL — ABNORMAL HIGH (ref <200)
HDL: 73 mg/dL (ref >=50)
LDL Cholesterol (Calc): 123 mg/dL (calc) — ABNORMAL HIGH
Non-HDL Cholesterol (Calc): 144 mg/dL (calc) — ABNORMAL HIGH (ref <130)
Total CHOL/HDL Ratio: 3 (calc) (ref <5.0)
Triglycerides: 105 mg/dL (ref <150)

## 2021-09-30 LAB — VITAMIN D 25 HYDROXY (VIT D DEFICIENCY, FRACTURES): Vit D, 25-Hydroxy: 48 ng/mL (ref 30–100)

## 2021-10-01 ENCOUNTER — Other Ambulatory Visit: Payer: Self-pay | Admitting: Obstetrics and Gynecology

## 2021-10-03 ENCOUNTER — Other Ambulatory Visit: Payer: Self-pay | Admitting: Family Medicine

## 2021-10-03 DIAGNOSIS — D500 Iron deficiency anemia secondary to blood loss (chronic): Secondary | ICD-10-CM

## 2021-10-04 ENCOUNTER — Other Ambulatory Visit: Payer: Self-pay

## 2021-10-04 ENCOUNTER — Encounter (HOSPITAL_BASED_OUTPATIENT_CLINIC_OR_DEPARTMENT_OTHER)
Admission: RE | Disposition: A | Discharge status: Home / Self Care | Payer: Self-pay | Source: Home / Self Care | Attending: Obstetrics and Gynecology

## 2021-10-04 ENCOUNTER — Observation Stay (HOSPITAL_BASED_OUTPATIENT_CLINIC_OR_DEPARTMENT_OTHER)
Admission: RE | Admit: 2021-10-04 | Discharge: 2021-10-05 | Disposition: A | Discharge status: Home / Self Care | Payer: BC Managed Care – PPO | Attending: Obstetrics and Gynecology | Admitting: Obstetrics and Gynecology

## 2021-10-04 ENCOUNTER — Ambulatory Visit (HOSPITAL_BASED_OUTPATIENT_CLINIC_OR_DEPARTMENT_OTHER): Payer: BC Managed Care – PPO | Admitting: Certified Registered Nurse Anesthetist

## 2021-10-04 ENCOUNTER — Encounter (HOSPITAL_BASED_OUTPATIENT_CLINIC_OR_DEPARTMENT_OTHER): Payer: Self-pay | Admitting: Obstetrics and Gynecology

## 2021-10-04 DIAGNOSIS — D251 Intramural leiomyoma of uterus: Principal | ICD-10-CM | POA: Insufficient documentation

## 2021-10-04 DIAGNOSIS — N72 Inflammatory disease of cervix uteri: Secondary | ICD-10-CM | POA: Diagnosis not present

## 2021-10-04 DIAGNOSIS — Z8616 Personal history of COVID-19: Secondary | ICD-10-CM | POA: Insufficient documentation

## 2021-10-04 DIAGNOSIS — N7011 Chronic salpingitis: Secondary | ICD-10-CM | POA: Diagnosis not present

## 2021-10-04 DIAGNOSIS — Z01818 Encounter for other preprocedural examination: Secondary | ICD-10-CM

## 2021-10-04 DIAGNOSIS — N888 Other specified noninflammatory disorders of cervix uteri: Secondary | ICD-10-CM | POA: Insufficient documentation

## 2021-10-04 DIAGNOSIS — Z91040 Latex allergy status: Secondary | ICD-10-CM | POA: Diagnosis not present

## 2021-10-04 DIAGNOSIS — N736 Female pelvic peritoneal adhesions (postinfective): Secondary | ICD-10-CM | POA: Diagnosis not present

## 2021-10-04 DIAGNOSIS — Z90710 Acquired absence of both cervix and uterus: Secondary | ICD-10-CM | POA: Diagnosis present

## 2021-10-04 DIAGNOSIS — Z79899 Other long term (current) drug therapy: Secondary | ICD-10-CM | POA: Insufficient documentation

## 2021-10-04 DIAGNOSIS — N838 Other noninflammatory disorders of ovary, fallopian tube and broad ligament: Secondary | ICD-10-CM | POA: Diagnosis not present

## 2021-10-04 DIAGNOSIS — I10 Essential (primary) hypertension: Secondary | ICD-10-CM | POA: Insufficient documentation

## 2021-10-04 DIAGNOSIS — D649 Anemia, unspecified: Secondary | ICD-10-CM | POA: Diagnosis not present

## 2021-10-04 DIAGNOSIS — N840 Polyp of corpus uteri: Secondary | ICD-10-CM | POA: Diagnosis not present

## 2021-10-04 DIAGNOSIS — E039 Hypothyroidism, unspecified: Secondary | ICD-10-CM | POA: Insufficient documentation

## 2021-10-04 DIAGNOSIS — N939 Abnormal uterine and vaginal bleeding, unspecified: Secondary | ICD-10-CM | POA: Diagnosis present

## 2021-10-04 HISTORY — DX: Endometriosis, unspecified: N80.9

## 2021-10-04 HISTORY — DX: Presence of spectacles and contact lenses: Z97.3

## 2021-10-04 HISTORY — PX: LAPAROSCOPIC VAGINAL HYSTERECTOMY WITH SALPINGECTOMY: SHX6680

## 2021-10-04 HISTORY — PX: CYSTOSCOPY: SHX5120

## 2021-10-04 HISTORY — DX: Thyrotoxicosis with diffuse goiter without thyrotoxic crisis or storm: E05.00

## 2021-10-04 HISTORY — DX: Essential (primary) hypertension: I10

## 2021-10-04 LAB — POCT PREGNANCY, URINE: Preg Test, Ur: NEGATIVE

## 2021-10-04 LAB — TYPE AND SCREEN
ABO/RH(D): A POS
Antibody Screen: NEGATIVE

## 2021-10-04 SURGERY — HYSTERECTOMY, VAGINAL, LAPAROSCOPY-ASSISTED, WITH SALPINGECTOMY
Anesthesia: General

## 2021-10-04 MED ORDER — CEFAZOLIN SODIUM-DEXTROSE 2-4 GM/100ML-% IV SOLN
2.0000 g | INTRAVENOUS | Status: AC
  Administered 2021-10-04: 2 g via INTRAVENOUS

## 2021-10-04 MED ORDER — HYDROMORPHONE HCL 1 MG/ML IJ SOLN
0.2500 mg | INTRAMUSCULAR | Status: DC | PRN
  Administered 2021-10-04: 0.5 mg via INTRAVENOUS
  Administered 2021-10-04: 0.25 mg via INTRAVENOUS

## 2021-10-04 MED ORDER — IBUPROFEN 200 MG PO TABS
600.0000 mg | ORAL_TABLET | Freq: Four times a day (QID) | ORAL | Status: DC | PRN
  Administered 2021-10-04 – 2021-10-05 (×2): 600 mg via ORAL

## 2021-10-04 MED ORDER — ONDANSETRON HCL 4 MG PO TABS: 4.0000 mg | ORAL_TABLET | Freq: Four times a day (QID) | ORAL | Status: DC | PRN

## 2021-10-04 MED ORDER — IBUPROFEN 200 MG PO TABS
ORAL_TABLET | ORAL | Status: AC
  Filled 2021-10-04: qty 3

## 2021-10-04 MED ORDER — KETOROLAC TROMETHAMINE 30 MG/ML IJ SOLN
INTRAMUSCULAR | Status: DC | PRN
  Administered 2021-10-04: 30 mg via INTRAVENOUS

## 2021-10-04 MED ORDER — PROPOFOL 10 MG/ML IV BOLUS
INTRAVENOUS | Status: AC
  Filled 2021-10-04: qty 20

## 2021-10-04 MED ORDER — LIDOCAINE HCL (PF) 2 % IJ SOLN
INTRAMUSCULAR | Status: AC
  Filled 2021-10-04: qty 5

## 2021-10-04 MED ORDER — SIMETHICONE 80 MG PO CHEW: 80.0000 mg | CHEWABLE_TABLET | Freq: Four times a day (QID) | ORAL | Status: DC | PRN

## 2021-10-04 MED ORDER — BUPIVACAINE HCL (PF) 0.25 % IJ SOLN
INTRAMUSCULAR | Status: DC | PRN
  Administered 2021-10-04: 8 mL

## 2021-10-04 MED ORDER — KETAMINE HCL 50 MG/5ML IJ SOSY
PREFILLED_SYRINGE | INTRAMUSCULAR | Status: AC
  Filled 2021-10-04: qty 5

## 2021-10-04 MED ORDER — SODIUM CHLORIDE 0.9 % IR SOLN
Status: DC | PRN
Start: 2021-10-04 — End: 2021-10-04
  Administered 2021-10-04: 1000 mL

## 2021-10-04 MED ORDER — DIPHENHYDRAMINE HCL 50 MG/ML IJ SOLN: 12.5000 mg | Freq: Four times a day (QID) | INTRAMUSCULAR | Status: DC | PRN

## 2021-10-04 MED ORDER — ROCURONIUM BROMIDE 10 MG/ML (PF) SYRINGE
PREFILLED_SYRINGE | INTRAVENOUS | Status: AC
  Filled 2021-10-04: qty 10

## 2021-10-04 MED ORDER — MIDAZOLAM HCL 2 MG/2ML IJ SOLN
INTRAMUSCULAR | Status: DC | PRN
Start: 2021-10-04 — End: 2021-10-04
  Administered 2021-10-04: 2 mg via INTRAVENOUS

## 2021-10-04 MED ORDER — ONDANSETRON HCL 4 MG/2ML IJ SOLN
INTRAMUSCULAR | Status: AC
  Filled 2021-10-04: qty 4

## 2021-10-04 MED ORDER — NALOXONE HCL 0.4 MG/ML IJ SOLN: 0.4000 mg | INTRAMUSCULAR | Status: DC | PRN

## 2021-10-04 MED ORDER — CEFAZOLIN SODIUM-DEXTROSE 1-4 GM/50ML-% IV SOLN
INTRAVENOUS | Status: AC
  Filled 2021-10-04: qty 50

## 2021-10-04 MED ORDER — FENTANYL CITRATE (PF) 100 MCG/2ML IJ SOLN
INTRAMUSCULAR | Status: AC
  Filled 2021-10-04: qty 2

## 2021-10-04 MED ORDER — EPHEDRINE SULFATE (PRESSORS) 50 MG/ML IJ SOLN
INTRAMUSCULAR | Status: DC | PRN
  Administered 2021-10-04: 5 mg via INTRAVENOUS

## 2021-10-04 MED ORDER — OXYCODONE-ACETAMINOPHEN 5-325 MG PO TABS
1.0000 | ORAL_TABLET | ORAL | Status: DC | PRN
  Administered 2021-10-04 – 2021-10-05 (×3): 1 via ORAL

## 2021-10-04 MED ORDER — ONDANSETRON HCL 4 MG/2ML IJ SOLN
4.0000 mg | Freq: Four times a day (QID) | INTRAMUSCULAR | Status: DC | PRN
  Administered 2021-10-04: 4 mg via INTRAVENOUS

## 2021-10-04 MED ORDER — DEXAMETHASONE SODIUM PHOSPHATE 10 MG/ML IJ SOLN
INTRAMUSCULAR | Status: DC | PRN
  Administered 2021-10-04: 10 mg via INTRAVENOUS

## 2021-10-04 MED ORDER — HYDROMORPHONE HCL 1 MG/ML IJ SOLN
INTRAMUSCULAR | Status: AC
Start: 2021-10-04 — End: ?
  Filled 2021-10-04: qty 1

## 2021-10-04 MED ORDER — ONDANSETRON HCL 4 MG/2ML IJ SOLN: 4.0000 mg | Freq: Four times a day (QID) | INTRAMUSCULAR | Status: DC | PRN

## 2021-10-04 MED ORDER — VASOPRESSIN 20 UNIT/ML IV SOLN
INTRAVENOUS | Status: DC | PRN
  Administered 2021-10-04: 20 mL via INTRAMUSCULAR

## 2021-10-04 MED ORDER — MIDAZOLAM HCL 2 MG/2ML IJ SOLN
INTRAMUSCULAR | Status: AC
Start: 2021-10-04 — End: ?
  Filled 2021-10-04: qty 2

## 2021-10-04 MED ORDER — LIDOCAINE 2% (20 MG/ML) 5 ML SYRINGE
INTRAMUSCULAR | Status: DC | PRN
Start: 2021-10-04 — End: 2021-10-04
  Administered 2021-10-04: 60 mg via INTRAVENOUS

## 2021-10-04 MED ORDER — LACTATED RINGERS IV SOLN: INTRAVENOUS | Status: DC

## 2021-10-04 MED ORDER — PROPOFOL 10 MG/ML IV BOLUS
INTRAVENOUS | Status: DC | PRN
Start: 2021-10-04 — End: 2021-10-04
  Administered 2021-10-04: 130 mg via INTRAVENOUS

## 2021-10-04 MED ORDER — FLUORESCEIN SODIUM 10 % IV SOLN
INTRAVENOUS | Status: AC
  Filled 2021-10-04: qty 5

## 2021-10-04 MED ORDER — POLYETHYLENE GLYCOL 3350 17 G PO PACK: 17.0000 g | PACK | Freq: Every day | ORAL | Status: DC | PRN

## 2021-10-04 MED ORDER — ROCURONIUM BROMIDE 10 MG/ML (PF) SYRINGE
PREFILLED_SYRINGE | INTRAVENOUS | Status: DC | PRN
Start: 2021-10-04 — End: 2021-10-04
  Administered 2021-10-04: 50 mg via INTRAVENOUS
  Administered 2021-10-04: 30 mg via INTRAVENOUS

## 2021-10-04 MED ORDER — FENTANYL CITRATE (PF) 250 MCG/5ML IJ SOLN
INTRAMUSCULAR | Status: DC | PRN
  Administered 2021-10-04 (×2): 25 ug via INTRAVENOUS
  Administered 2021-10-04 (×2): 50 ug via INTRAVENOUS
  Administered 2021-10-04 (×2): 25 ug via INTRAVENOUS

## 2021-10-04 MED ORDER — EPHEDRINE 5 MG/ML INJ
INTRAVENOUS | Status: AC
  Filled 2021-10-04: qty 5

## 2021-10-04 MED ORDER — FLUORESCEIN SODIUM 10 % IV SOLN
INTRAVENOUS | Status: DC | PRN
  Administered 2021-10-04: 5 mg via INTRAVENOUS

## 2021-10-04 MED ORDER — AMLODIPINE BESYLATE 5 MG PO TABS
5.0000 mg | ORAL_TABLET | Freq: Every day | ORAL | Status: DC
Start: 2021-10-04 — End: 2021-10-05
  Administered 2021-10-04: 5 mg via ORAL
  Filled 2021-10-04: qty 1

## 2021-10-04 MED ORDER — ACETAMINOPHEN 500 MG PO TABS
ORAL_TABLET | ORAL | Status: AC
  Filled 2021-10-04: qty 2

## 2021-10-04 MED ORDER — HYDROMORPHONE 1 MG/ML IV SOLN: INTRAVENOUS | Status: DC

## 2021-10-04 MED ORDER — ONDANSETRON HCL 4 MG/2ML IJ SOLN
INTRAMUSCULAR | Status: AC
  Filled 2021-10-04: qty 2

## 2021-10-04 MED ORDER — OXYCODONE-ACETAMINOPHEN 5-325 MG PO TABS
ORAL_TABLET | ORAL | Status: AC
  Filled 2021-10-04: qty 1

## 2021-10-04 MED ORDER — SODIUM CHLORIDE 0.9% FLUSH
9.0000 mL | INTRAVENOUS | Status: DC | PRN
Start: 2021-10-04 — End: 2021-10-05

## 2021-10-04 MED ORDER — ACETAMINOPHEN 500 MG PO TABS
1000.0000 mg | ORAL_TABLET | Freq: Once | ORAL | Status: AC
  Administered 2021-10-04: 1000 mg via ORAL

## 2021-10-04 MED ORDER — ARTIFICIAL TEARS OPHTHALMIC OINT
TOPICAL_OINTMENT | OPHTHALMIC | Status: AC
  Filled 2021-10-04: qty 3.5

## 2021-10-04 MED ORDER — SUGAMMADEX SODIUM 200 MG/2ML IV SOLN
INTRAVENOUS | Status: DC | PRN
  Administered 2021-10-04: 200 mg via INTRAVENOUS

## 2021-10-04 MED ORDER — ONDANSETRON HCL 4 MG/2ML IJ SOLN
INTRAMUSCULAR | Status: DC | PRN
Start: 2021-10-04 — End: 2021-10-04
  Administered 2021-10-04: 4 mg via INTRAVENOUS

## 2021-10-04 MED ORDER — POVIDONE-IODINE 10 % EX SWAB: 2.0000 "application " | Freq: Once | CUTANEOUS | Status: DC

## 2021-10-04 MED ORDER — KETAMINE HCL 10 MG/ML IJ SOLN
INTRAMUSCULAR | Status: DC | PRN
  Administered 2021-10-04: 30 mg via INTRAVENOUS

## 2021-10-04 MED ORDER — ESTRADIOL 0.1 MG/GM VA CREA
TOPICAL_CREAM | VAGINAL | Status: DC | PRN
  Administered 2021-10-04: 1 via VAGINAL

## 2021-10-04 MED ORDER — MENTHOL 3 MG MT LOZG: 1.0000 | LOZENGE | OROMUCOSAL | Status: DC | PRN

## 2021-10-04 MED ORDER — DEXAMETHASONE SODIUM PHOSPHATE 10 MG/ML IJ SOLN
INTRAMUSCULAR | Status: AC
Start: 2021-10-04 — End: ?
  Filled 2021-10-04: qty 2

## 2021-10-04 MED ORDER — LEVOTHYROXINE SODIUM 25 MCG PO TABS
25.0000 ug | ORAL_TABLET | Freq: Every day | ORAL | Status: DC
  Administered 2021-10-04: 25 ug via ORAL
  Filled 2021-10-04: qty 1

## 2021-10-04 MED ORDER — DIPHENHYDRAMINE HCL 12.5 MG/5ML PO ELIX: 12.5000 mg | ORAL_SOLUTION | Freq: Four times a day (QID) | ORAL | Status: DC | PRN

## 2021-10-04 SURGICAL SUPPLY — 51 items
ADH SKN CLS APL DERMABOND .7 (GAUZE/BANDAGES/DRESSINGS) ×2
APL SRG 38 LTWT LNG FL B (MISCELLANEOUS)
APPLICATOR ARISTA FLEXITIP XL (MISCELLANEOUS) IMPLANT
CABLE HIGH FREQUENCY MONO STRZ (ELECTRODE) IMPLANT
COVER BACK TABLE 60X90IN (DRAPES) ×3 IMPLANT
COVER MAYO STAND STRL (DRAPES) ×3 IMPLANT
DERMABOND ADVANCED (GAUZE/BANDAGES/DRESSINGS) ×1
DERMABOND ADVANCED .7 DNX12 (GAUZE/BANDAGES/DRESSINGS) IMPLANT
DRSG OPSITE POSTOP 3X4 (GAUZE/BANDAGES/DRESSINGS) ×2 IMPLANT
DURAPREP 26ML APPLICATOR (WOUND CARE) ×3 IMPLANT
ELECT REM PT RETURN 9FT ADLT (ELECTROSURGICAL) ×3
ELECTRODE REM PT RTRN 9FT ADLT (ELECTROSURGICAL) IMPLANT
FORCEPS CUTTING 33CM 5MM (CUTTING FORCEPS) ×1 IMPLANT
GAUZE 4X4 16PLY ~~LOC~~+RFID DBL (SPONGE) ×7 IMPLANT
GAUZE PACKING 1/2X5YD (GAUZE/BANDAGES/DRESSINGS) ×1 IMPLANT
GAUZE PACKING 2X5 YD STRL (GAUZE/BANDAGES/DRESSINGS) IMPLANT
GAUZE VASELINE 3X9 (GAUZE/BANDAGES/DRESSINGS) IMPLANT
GLOVE BIO SURGEON STRL SZ 6.5 (GLOVE) ×6 IMPLANT
GLOVE BIOGEL PI IND STRL 7.0 (GLOVE) ×8 IMPLANT
GLOVE BIOGEL PI INDICATOR 7.0 (GLOVE) ×4
GLOVE ECLIPSE 6.5 STRL STRAW (GLOVE) ×3 IMPLANT
HEMOSTAT ARISTA ABSORB 3G PWDR (HEMOSTASIS) IMPLANT
KIT TURNOVER CYSTO (KITS) ×3 IMPLANT
NDL MAYO CATGUT SZ4 TPR NDL (NEEDLE) ×2 IMPLANT
NEEDLE MAYO CATGUT SZ4 (NEEDLE) ×3 IMPLANT
NS IRRIG 1000ML POUR BTL (IV SOLUTION) ×3 IMPLANT
PACK LAVH (CUSTOM PROCEDURE TRAY) ×3 IMPLANT
PACK ROBOTIC GOWN (GOWN DISPOSABLE) ×3 IMPLANT
PACK TRENDGUARD 450 HYBRID PRO (MISCELLANEOUS) IMPLANT
PAD OB MATERNITY 4.3X12.25 (PERSONAL CARE ITEMS) ×3 IMPLANT
SET IRRIG Y TYPE TUR BLADDER L (SET/KITS/TRAYS/PACK) ×3 IMPLANT
SET SUCTION IRRIG HYDROSURG (IRRIGATION / IRRIGATOR) ×1 IMPLANT
SET TUBE SMOKE EVAC HIGH FLOW (TUBING) ×3 IMPLANT
SHEARS HARMONIC ACE PLUS 36CM (ENDOMECHANICALS) IMPLANT
SOLUTION ELECTROLUBE (MISCELLANEOUS) ×1 IMPLANT
SPONGE T-LAP 4X18 ~~LOC~~+RFID (SPONGE) ×3 IMPLANT
SUT CHROMIC 0 CT 1 (SUTURE) ×3 IMPLANT
SUT CHROMIC 0 UR 5 27 (SUTURE) IMPLANT
SUT MNCRL AB 3-0 PS2 27 (SUTURE) ×6 IMPLANT
SUT VIC AB 0 CT1 18XCR BRD8 (SUTURE) ×2 IMPLANT
SUT VIC AB 0 CT1 36 (SUTURE) ×6 IMPLANT
SUT VIC AB 0 CT1 8-18 (SUTURE) ×6
SUT VICRYL 0 TIES 12 18 (SUTURE) ×3 IMPLANT
SUT VICRYL 0 UR6 27IN ABS (SUTURE) ×3 IMPLANT
SYR 50ML LL SCALE MARK (SYRINGE) ×1 IMPLANT
SYR BULB IRRIG 60ML STRL (SYRINGE) ×3 IMPLANT
TRAY FOLEY W/BAG SLVR 14FR LF (SET/KITS/TRAYS/PACK) ×3 IMPLANT
TRENDGUARD 450 HYBRID PRO PACK (MISCELLANEOUS)
TROCAR BALLN 12MMX100 BLUNT (TROCAR) ×3 IMPLANT
TROCAR BLADELESS OPT 5 100 (ENDOMECHANICALS) ×6 IMPLANT
WARMER LAPAROSCOPE (MISCELLANEOUS) ×3 IMPLANT

## 2021-10-04 NOTE — Anesthesia Procedure Notes (Signed)
Procedure Name: Intubation ?Date/Time: 10/04/2021 12:40 PM ?Performed by: Clearnce Sorrel, CRNA ?Pre-anesthesia Checklist: Patient identified, Emergency Drugs available, Suction available and Patient being monitored ?Patient Re-evaluated:Patient Re-evaluated prior to induction ?Oxygen Delivery Method: Circle System Utilized ?Preoxygenation: Pre-oxygenation with 100% oxygen ?Induction Type: IV induction ?Ventilation: Mask ventilation without difficulty ?Laryngoscope Size: Mac and 3 ?Grade View: Grade II ?Tube type: Oral ?Tube size: 7.0 mm ?Number of attempts: 1 ?Airway Equipment and Method: Stylet and Oral airway ?Placement Confirmation: ETT inserted through vocal cords under direct vision, positive ETCO2 and breath sounds checked- equal and bilateral ?Secured at: 22 cm ?Tube secured with: Tape ?Dental Injury: Teeth and Oropharynx as per pre-operative assessment  ? ? ? ? ?

## 2021-10-04 NOTE — H&P (Signed)
Dawn Bates is an 51 y.o. female. Presenting for hysterectomy.  She has had menorhhagia causing anemia as low as 6.  She soaks through several pads an hour and her menses lasts 7-10 days.  She has tried OCPS without help and desires to have a hysterectomy ? ?Pertinent Gynecological History: ?Menses: flow is excessive with use of 10 pads or tampons on heaviest days ?Bleeding: dysfunctional uterine bleeding ?Contraception: tubal ligation ?DES exposure: denies ?Blood transfusions:  yes 2021 ?Sexually transmitted diseases: no past history ?Previous GYN Procedures:      ?Last mammogram: normal Date: 2022 ?Last pap: normal Date: 2022 ?OB History: G7, P3  ? ?Menstrual History: ?Menarche age: 60 ?Patient's last menstrual period was 09/09/2021 (exact date). ?  ? ?Past Medical History:  ?Diagnosis Date  ? Abnormal uterine bleeding 2022  ? Anemia 02/12/2021  ? Patient is taking iron supplementation.  ? COVID-19 2020  ? cold-like symptoms  ? Endometriosis   ? Fibroids   ? Graves disease   ? Hypertension   ? Hyperthyroidism 2022  ? s/p Nuclear Medicine RAI therapy on 08/07/20  ? Seasonal allergies   ? severe chronic allergies, runny nose, congestion, cough when she lays down due to drainage  ? Vitamin D deficiency 2021  ? Wears glasses   ? ? ?Past Surgical History:  ?Procedure Laterality Date  ? TUBAL LIGATION  09/2008  ? ? ?Family History  ?Problem Relation Age of Onset  ? Cancer Sister 63  ?     breast "onset Late 20's"  ? Breast cancer Sister   ? Thyroid cancer Sister   ?     pt does not know cell type  ? Breast cancer Maternal Aunt   ? ? ?Social History:  reports that she has never smoked. She has never used smokeless tobacco. She reports that she does not drink alcohol and does not use drugs. ? ?Allergies:  ?Allergies  ?Allergen Reactions  ? Latex Itching  ? ? ?Medications Prior to Admission  ?Medication Sig Dispense Refill Last Dose  ? amLODipine (NORVASC) 5 MG tablet TAKE 1 TABLET (5 MG TOTAL) BY MOUTH DAILY.  (Patient taking differently: Take 5 mg by mouth at bedtime.) 90 tablet 1 10/03/2021  ? ferrous sulfate 325 (65 FE) MG tablet TAKE 1 TABLET BY MOUTH EVERY DAY 90 tablet 1 10/03/2021  ? Iron-FA-B Cmp-C-Biot-Probiotic (FUSION PLUS) CAPS Take 1 capsule by mouth daily.  3 Past Week  ? levocetirizine (XYZAL) 5 MG tablet TAKE 1 TABLET BY MOUTH EVERY DAY IN THE EVENING 90 tablet 2 10/04/2021 at 0800  ? levothyroxine (SYNTHROID) 25 MCG tablet Take 1 tablet (25 mcg total) by mouth daily. 90 tablet 3 10/03/2021  ? valACYclovir (VALTREX) 500 MG tablet Take 500 mg by mouth as needed.   10/04/2021 at 0800  ? Vitamin D, Ergocalciferol, (DRISDOL) 1.25 MG (50000 UNIT) CAPS capsule TAKE 1 CAPSULE (50,000 UNITS TOTAL) BY MOUTH EVERY 7 (SEVEN) DAYS 12 capsule 0 Past Week  ? mometasone (NASONEX) 50 MCG/ACT nasal spray Place 2 sprays into the nose daily. 17 g 3 Unknown  ? ? ?Review of Systems ? ?Blood pressure (!) 121/92, pulse 68, temperature 97.9 ?F (36.6 ?C), temperature source Oral, resp. rate 15, height '5\' 6"'$  (1.676 m), weight 66.9 kg, last menstrual period 09/09/2021, SpO2 99 %. ?Physical Exam ?Physical Examination: General appearance - alert, well appearing, and in no distress ?Chest - clear to auscultation, no wheezes, rales or rhonchi, symmetric air entry ?Heart - normal rate and regular  rhythm ?Abdomen - soft, nontender, nondistended, no masses or organomegaly ?Breasts - breasts appear normal, no suspicious masses, no skin or nipple changes or axillary nodes ?Pelvic - normal external genitalia, vulva, vagina, cervix, uterus and adnexa ?Rectal - normal rectal, no masses ?Neurological - alert, oriented, normal speech, no focal findings or movement disorder noted ?Extremities - peripheral pulses normal, no pedal edema, no clubbing or cyanosis ?Skin - normal coloration and turgor, no rashes, no suspicious skin lesions noted  ? ?Results for orders placed or performed during the hospital encounter of 10/04/21 (from the past 24 hour(s))   ?Pregnancy, urine POC     Status: None  ? Collection Time: 10/04/21 10:31 AM  ?Result Value Ref Range  ? Preg Test, Ur NEGATIVE NEGATIVE  ? ? ?  Latest Ref Rng & Units 09/28/2021  ?  2:54 PM 07/28/2021  ?  4:32 PM 04/23/2021  ?  3:05 PM  ?CBC  ?WBC 4.0 - 10.5 K/uL 5.0    5.9    ?Hemoglobin 12.0 - 15.0 g/dL 10.3   10.4   10.5    ?Hematocrit 36.0 - 46.0 % 35.0   35.3   35.8    ?Platelets 150 - 400 K/uL 194    208    ?  ?No results found. ? ?Assessment/Plan: ?Menorrhagia ?EMBX 11/22 benign ?[place] sig for two fibroids ?All treatments reviewed.  Pt desires hysterectomy.  She understands the risks are but not limited to bleeding, infection, damage to internal organs like bowel and bladder ?Plan LAVH poss TAH ? ? ?Shakeria Robinette A Ronne Stefanski ?10/04/2021, 11:29 AM ? ?

## 2021-10-04 NOTE — Anesthesia Preprocedure Evaluation (Addendum)
Anesthesia Evaluation  ?Patient identified by MRN, date of birth, ID band ?Patient awake ? ? ? ?Reviewed: ?Allergy & Precautions, H&P , NPO status , Patient's Chart, lab work & pertinent test results ? ?Airway ?Mallampati: II ? ?TM Distance: >3 FB ?Neck ROM: Full ? ? ? Dental ?no notable dental hx. ?(+) Teeth Intact, Dental Advisory Given ?  ?Pulmonary ?neg pulmonary ROS,  ?  ?Pulmonary exam normal ?breath sounds clear to auscultation ? ? ? ? ? ? Cardiovascular ?hypertension, Pt. on medications ? ?Rhythm:Regular Rate:Normal ? ? ?  ?Neuro/Psych ? Headaches, negative psych ROS  ? GI/Hepatic ?negative GI ROS, Neg liver ROS,   ?Endo/Other  ?Hypothyroidism  ? Renal/GU ?negative Renal ROS  ?negative genitourinary ?  ?Musculoskeletal ? ? Abdominal ?  ?Peds ? Hematology ? ?(+) Blood dyscrasia, anemia ,   ?Anesthesia Other Findings ? ? Reproductive/Obstetrics ?negative OB ROS ? ?  ? ? ? ? ? ? ? ? ? ? ? ? ? ?  ?  ? ? ? ? ? ? ? ?Anesthesia Physical ?Anesthesia Plan ? ?ASA: 2 ? ?Anesthesia Plan: General  ? ?Post-op Pain Management: Tylenol PO (pre-op)* and Toradol IV (intra-op)*  ? ?Induction: Intravenous ? ?PONV Risk Score and Plan: 4 or greater and Ondansetron, Dexamethasone and Midazolam ? ?Airway Management Planned: Oral ETT ? ?Additional Equipment:  ? ?Intra-op Plan:  ? ?Post-operative Plan: Extubation in OR ? ?Informed Consent: I have reviewed the patients History and Physical, chart, labs and discussed the procedure including the risks, benefits and alternatives for the proposed anesthesia with the patient or authorized representative who has indicated his/her understanding and acceptance.  ? ? ? ?Dental advisory given ? ?Plan Discussed with: CRNA ? ?Anesthesia Plan Comments:   ? ? ? ? ? ? ?Anesthesia Quick Evaluation ? ?

## 2021-10-04 NOTE — Transfer of Care (Signed)
Immediate Anesthesia Transfer of Care Note ? ?Patient: Lillien Petronio Bleier ? ?Procedure(s) Performed: LAPAROSCOPIC ASSISTED VAGINAL HYSTERECTOMY WITH SALPINGECTOMY ?CYSTOSCOPY ? ?Patient Location: PACU ? ?Anesthesia Type:General ? ?Level of Consciousness: drowsy ? ?Airway & Oxygen Therapy: Patient Spontanous Breathing ? ?Post-op Assessment: Report given to RN and Post -op Vital signs reviewed and stable ? ?Post vital signs: Reviewed and stable ? ?Last Vitals:  ?Vitals Value Taken Time  ?BP 126/81 10/04/21 1533  ?Temp    ?Pulse 72 10/04/21 1535  ?Resp 17 10/04/21 1535  ?SpO2 98 % 10/04/21 1535  ?Vitals shown include unvalidated device data. ? ?Last Pain:  ?Vitals:  ? 10/04/21 1109  ?TempSrc: Oral  ?PainSc: 0-No pain  ?   ? ?Patients Stated Pain Goal: 5 (10/04/21 1109) ? ?Complications: No notable events documented. ?

## 2021-10-04 NOTE — Op Note (Signed)
agnosis: menorrhagia.  Symptomatic anemia ? ?Postop Diagnosis:same  ? ?Procedure: LAVH, B salpingectmyCystoscopy ? ?Anesthesia: General  ? ?Anesthesiologist:  ? ?Attending: Betsy Coder, MD  ? ?Assistant: Everett Graff MD ? ?Findings: 10 wk size uterus.  Bladder uterine adhesions ? ?Pathology: uterus and cervix and bilateral tubes ? ?Fluids: 1500 cccrystalloid ? ?UOP: 3000cc ? ?EBL: 250cc ? ?Complications:none ? ?Procedure: The patient was taken to the operating room, placed under general anesthesia and prepped and draped in the normal sterile fashion. A Foley catheter was placed in the bladder. A weighted speculum and vaginal retractors were placed in the vagina. Tenaculum was placed on the anterior lip of the cervix.  A hulka manipulator was placed in the uterus. Attention was then turned to the abdomen. A 10 mm infraumbilical incision was made with the scalpel after 5 cc of 25% percent Marcaine was used for local anesthesia. The subcutaneous tissue was dissected and the fascia was incised with the knife. A purse string stitch was placed in the fascia and Hassan placed into the intra-abdominal cavity and anchored to the suture. Intraabdominal placement was confirmed with the laparoscope.  Two 5 mm trochars were placed in the right and left lower quadrants under direct visualization with the laparoscope.   .   Both round ligaments were cauterized and cut with the gyrus bipolar cautery as well and the bladder flap created with the gyrus and hydrodisection using the nigat and removed away from the uterus.The left fallopian tube was cauterized and removed.    The left uterine ovarian ligament was then cauterized and cut.  The right fallopian tube was cauterized cut and removed.  The right utero-ovarian ligament was cauterized and cut with the tripolar cautery/    Attention was then turned to the vagina.  A weighted speculum was placed in the posterior fourchette.  petrussin mixture was placed circumferentially  around the cervix.  With blunt and sharp dissection the cervix was dissected away from the bowel and bladder.  Both uterosacral ligaments were clamped, cut and suture ligated and held.  The anterior and posterior culdesac was entered sharply using metzenbaum scissors.  The cardinal ligaments and  The uterine arteries were clamped, cut and suture ligated bilaterally.    The uterus was then delivered.  The mcall suture was placed.   The vaginal cuff was closed with interrupted suture of 0 chromic.   the patient was given fluroscein  .  Cystoscopy was performed and both ureters were seen to efflux fluroscein  without difficulty. The bladder had full integrity with no suture or laceration visualized.  The vagina was inspected and the cuff was noted to be intact.  Attention was then turned back to the abdomen after removing top pair of gloves. The abdomen was reinsufflated with CO2 gas.   The abdomen and pelvis was copiously irrigated.       hemostasis was noted.  gelfoam was placed along the cuff.    All trochars were removed under direct visualization using the laparoscope.  The umbilical fascia was reapproximated by tying the circumferential suture. The two 5 mm incisions were closed with 3-0 Monocryl via a subcuticular stitch.  All remaining skin incisions were closed with Dermabond and the 10 mm skin incisions were reinforced using Dermabond.  Sponge lap and needle counts were correct.  The patient tolerated the procedure well and was returned to the PACU in stable condition   ?

## 2021-10-05 ENCOUNTER — Encounter (HOSPITAL_BASED_OUTPATIENT_CLINIC_OR_DEPARTMENT_OTHER): Payer: Self-pay | Admitting: Obstetrics and Gynecology

## 2021-10-05 DIAGNOSIS — D251 Intramural leiomyoma of uterus: Secondary | ICD-10-CM | POA: Diagnosis not present

## 2021-10-05 LAB — BASIC METABOLIC PANEL
Anion gap: 6 (ref 5–15)
BUN: 13 mg/dL (ref 6–20)
CO2: 24 mmol/L (ref 22–32)
Calcium: 9 mg/dL (ref 8.9–10.3)
Chloride: 104 mmol/L (ref 98–111)
Creatinine, Ser: 0.8 mg/dL (ref 0.44–1.00)
GFR, Estimated: >60 mL/min (ref >60)
Glucose, Bld: 130 mg/dL — ABNORMAL HIGH (ref 70–99)
Potassium: 4.1 mmol/L (ref 3.5–5.1)
Sodium: 134 mmol/L — ABNORMAL LOW (ref 135–145)

## 2021-10-05 LAB — CBC
HCT: 28.5 % — ABNORMAL LOW (ref 36.0–46.0)
Hemoglobin: 8.6 g/dL — ABNORMAL LOW (ref 12.0–15.0)
MCH: 23.2 pg — ABNORMAL LOW (ref 26.0–34.0)
MCHC: 30.2 g/dL (ref 30.0–36.0)
MCV: 77 fL — ABNORMAL LOW (ref 80.0–100.0)
Platelets: 177 10*3/uL (ref 150–400)
RBC: 3.7 MIL/uL — ABNORMAL LOW (ref 3.87–5.11)
RDW: 18.4 % — ABNORMAL HIGH (ref 11.5–15.5)
WBC: 9.5 10*3/uL (ref 4.0–10.5)
nRBC: 0 % (ref 0.0–0.2)

## 2021-10-05 MED ORDER — IBUPROFEN 200 MG PO TABS
ORAL_TABLET | ORAL | Status: AC
  Filled 2021-10-05: qty 3

## 2021-10-05 MED ORDER — OXYCODONE-ACETAMINOPHEN 5-325 MG PO TABS: 1.0000 | ORAL_TABLET | Freq: Four times a day (QID) | ORAL | 0 refills | Status: DC | PRN

## 2021-10-05 MED ORDER — DOCUSATE SODIUM 100 MG PO CAPS: 100.0000 mg | ORAL_CAPSULE | Freq: Two times a day (BID) | ORAL | 0 refills | Status: DC

## 2021-10-05 MED ORDER — PROMETHAZINE HCL 12.5 MG PO TABS: 12.5000 mg | ORAL_TABLET | Freq: Four times a day (QID) | ORAL | 0 refills | Status: DC | PRN

## 2021-10-05 MED ORDER — WHITE PETROLATUM EX OINT
TOPICAL_OINTMENT | CUTANEOUS | Status: AC
  Filled 2021-10-05: qty 5

## 2021-10-05 MED ORDER — OXYCODONE-ACETAMINOPHEN 5-325 MG PO TABS
ORAL_TABLET | ORAL | Status: AC
  Filled 2021-10-05: qty 1

## 2021-10-05 MED ORDER — POLYETHYLENE GLYCOL 3350 17 G PO PACK: 17.0000 g | PACK | Freq: Every day | ORAL | 0 refills | Status: DC | PRN

## 2021-10-05 MED ORDER — IBUPROFEN 200 MG PO TABS: 600.0000 mg | ORAL_TABLET | Freq: Four times a day (QID) | ORAL | 0 refills | Status: DC | PRN

## 2021-10-05 NOTE — Discharge Summary (Signed)
Physician Discharge Summary  ?Patient ID: ?Inniswold ?MRN: [id number] ?DOB/AGE: April 26, 1971 51 y.o. ? ?Admit date: 10/04/2021 ?Discharge date: 10/05/2021 ? ?Admission Diagnoses: ? ?Discharge Diagnoses:  ?Principal Problem: ?  Abnormal uterine and vaginal bleeding, unspecified ?Active Problems: ?  S/P laparoscopic assisted vaginal hysterectomy (LAVH) ? ? ?Discharged Condition: good ? ?Hospital Course: Pt underwent an LAVH , cysto and B salpingectomy.  She has eaten and pain is well controlled.  She is ready for discharge.   ? ?Consults: None ? ?Significant Diagnostic Studies: labs:  and pathology ? ? ?Treatments: IV hydration, antibiotics: Ancef, and surgery: see above ? ?Discharge Exam: ?Blood pressure 129/83, pulse 63, temperature 98.1 ?F (36.7 ?C), resp. rate 16, height '5\' 6"'$  (1.676 m), weight 66.9 kg, last menstrual period 09/09/2021, SpO2 98 %. ?General appearance: alert and cooperative ?Resp: clear to auscultation bilaterally ?Cardio: regular rate and rhythm ?Pelvic: minimal bleeding ?Extremities: Homans sign is negative, no sign of DVT ?Incision/Wound: CDI  ? ?Disposition: Discharge disposition: 01-Home or Self Care ? ? ? ? ? ? ?Discharge Instructions   ? ? Call MD for:  difficulty breathing, headache or visual disturbances   Complete by: As directed ?  ? Call MD for:  hives   Complete by: As directed ?  ? Call MD for:  persistant dizziness or light-headedness   Complete by: As directed ?  ? Call MD for:  persistant nausea and vomiting   Complete by: As directed ?  ? Call MD for:  redness, tenderness, or signs of infection (pain, swelling, redness, odor or green/yellow discharge around incision site)   Complete by: As directed ?  ? Call MD for:  severe uncontrolled pain   Complete by: As directed ?  ? Call MD for:  temperature >100.4   Complete by: As directed ?  ? Diet - low sodium heart healthy   Complete by: As directed ?  ? Diet general   Complete by: As directed ?  ? Discharge instructions    Complete by: As directed ?  ? Call for vaginal bleeding if you soak greater than pad per hour  ? Driving Restrictions   Complete by: As directed ?  ? No driving for two weeks or while taking percocet  ? Increase activity slowly   Complete by: As directed ?  ? May shower / Bathe   Complete by: As directed ?  ? No dressing needed   Complete by: As directed ?  ? Sexual Activity Restrictions   Complete by: As directed ?  ? Pelvic rest.  Nothing in the vagina  ? Nothing in the vagina for six weeks  ? ?  ? ?Allergies as of 10/05/2021   ? ?   Reactions  ? Latex Itching  ? ?  ? ?  ?Medication List  ?  ? ?STOP taking these medications   ? ?Fusion Plus Caps ?  ? ?  ? ?TAKE these medications   ? ?amLODipine 5 MG tablet ?Commonly known as: NORVASC ?TAKE 1 TABLET (5 MG TOTAL) BY MOUTH DAILY. ?What changed: when to take this ?  ?docusate sodium 100 MG capsule ?Commonly known as: Colace ?Take 1 capsule (100 mg total) by mouth 2 (two) times daily. ?  ?ferrous sulfate 325 (65 FE) MG tablet ?TAKE 1 TABLET BY MOUTH EVERY DAY ?  ?ibuprofen 200 MG tablet ?Commonly known as: Advil ?Take 3 tablets (600 mg total) by mouth every 6 (six) hours as needed. ?  ?levocetirizine 5 MG tablet ?Commonly  known as: XYZAL ?TAKE 1 TABLET BY MOUTH EVERY DAY IN THE EVENING ?  ?levothyroxine 25 MCG tablet ?Commonly known as: SYNTHROID ?Take 1 tablet (25 mcg total) by mouth daily. ?  ?mometasone 50 MCG/ACT nasal spray ?Commonly known as: Nasonex ?Place 2 sprays into the nose daily. ?  ?oxyCODONE-acetaminophen 5-325 MG tablet ?Commonly known as: PERCOCET/ROXICET ?Take 1 tablet by mouth every 6 (six) hours as needed for moderate pain. ?  ?polyethylene glycol 17 g packet ?Commonly known as: MIRALAX / GLYCOLAX ?Take 17 g by mouth daily as needed for mild constipation. ?  ?promethazine 12.5 MG tablet ?Commonly known as: PHENERGAN ?Take 1 tablet (12.5 mg total) by mouth every 6 (six) hours as needed for nausea or vomiting. ?  ?valACYclovir 500 MG tablet ?Commonly  known as: VALTREX ?Take 500 mg by mouth as needed. ?  ?Vitamin D (Ergocalciferol) 1.25 MG (50000 UNIT) Caps capsule ?Commonly known as: DRISDOL ?TAKE 1 CAPSULE (50,000 UNITS TOTAL) BY MOUTH EVERY 7 (SEVEN) DAYS ?  ? ?  ? ?  ?  ? ? ?  ?Discharge Care Instructions  ?(From admission, onward)  ?  ? ? ?  ? ?  Start     Ordered  ? 10/05/21 0000  No dressing needed       ? 10/05/21 0834  ? ?  ?  ? ?  ? ? Follow-up Information   ? ? Crawford Givens, MD Follow up in 1 week(s).   ?Specialty: Obstetrics and Gynecology ?Why: Th visit and six weeks for office visit ?Contact information: ?[address] ?[address] ?[place] [address] ?[phone] ? ? ?  ?  ? ?  ?  ? ?  ? ? ?Signed: ?Xochilth Standish A Jong Rickman ?10/05/2021, 8:36 AM ? ? ?

## 2021-10-05 NOTE — Anesthesia Postprocedure Evaluation (Signed)
Anesthesia Post Note ? ?Patient: Dawn Bates ? ?Procedure(s) Performed: LAPAROSCOPIC ASSISTED VAGINAL HYSTERECTOMY WITH SALPINGECTOMY ?CYSTOSCOPY ? ?  ? ?Patient location during evaluation: PACU ?Anesthesia Type: General ?Level of consciousness: awake and alert ?Pain management: pain level controlled ?Vital Signs Assessment: post-procedure vital signs reviewed and stable ?Respiratory status: spontaneous breathing, nonlabored ventilation and respiratory function stable ?Cardiovascular status: blood pressure returned to baseline and stable ?Postop Assessment: no apparent nausea or vomiting ?Anesthetic complications: no ? ? ?No notable events documented. ? ?Last Vitals:  ?Vitals:  ? 10/05/21 0514 10/05/21 0828  ?BP: 131/74 129/83  ?Pulse: 66 63  ?Resp: 14 16  ?Temp: 37.1 ?C 36.7 ?C  ?SpO2: 97% 98%  ?  ?Last Pain:  ?Vitals:  ? 10/05/21 0830  ?TempSrc:   ?PainSc: 2   ? ? ?  ?  ?  ?  ?  ?  ? ?Robbi Calix,W. EDMOND ? ? ? ? ?

## 2021-10-05 NOTE — Progress Notes (Signed)
Vaginal packing and catheter removed at 0515 ?

## 2021-10-06 LAB — SURGICAL PATHOLOGY

## 2021-10-29 ENCOUNTER — Ambulatory Visit: Payer: BC Managed Care – PPO | Admitting: Endocrinology

## 2021-11-25 ENCOUNTER — Other Ambulatory Visit: Payer: Self-pay | Admitting: Family Medicine

## 2022-01-12 ENCOUNTER — Other Ambulatory Visit: Payer: Self-pay | Admitting: Family Medicine

## 2022-02-18 ENCOUNTER — Telehealth: Payer: Self-pay | Admitting: Internal Medicine

## 2022-02-18 DIAGNOSIS — E890 Postprocedural hypothyroidism: Secondary | ICD-10-CM

## 2022-02-18 NOTE — Telephone Encounter (Signed)
MEDICATION: levothyroxine (SYNTHROID) 25 MCG tablet  PHARMACY:  [id number] [place], [place] - [address] (Ph: [phone]  HAS THE PATIENT CONTACTED THEIR PHARMACY?  Yes  IS THIS A 90 DAY SUPPLY : Yes  IS PATIENT OUT OF MEDICATION: Yes  IF NOT; HOW MUCH IS LEFT:   LAST APPOINTMENT DATE: '@02'$ /01/2022  NEXT APPOINTMENT DATE:'@10'$ /11/2021  DO WE HAVE YOUR PERMISSION TO LEAVE A DETAILED MESSAGE?:Yes  OTHER COMMENTS:    **Let patient know to contact pharmacy at the end of the day to make sure medication is ready. **  ** Please notify patient to allow 48-72 hours to process**  **Encourage patient to contact the pharmacy for refills or [facility]*

## 2022-02-19 LAB — GLUCOSE, POCT (MANUAL RESULT ENTRY): POC Glucose: 130 mg/dl — AB (ref 70–99)

## 2022-02-24 MED ORDER — LEVOTHYROXINE SODIUM 25 MCG PO TABS: 25.0000 ug | ORAL_TABLET | Freq: Every day | ORAL | 3 refills | Status: DC

## 2022-02-24 NOTE — Telephone Encounter (Signed)
RX now sent as message was received via access nurse that the patient needed a refill. Message put in on 9/1 sent to the incorrect pool. Attempted to contact the patient to let her know and LVM for any questions or concerns.

## 2022-02-24 NOTE — Addendum Note (Signed)
Addended by: Sarina Ill on: 02/24/2022 09:11 AM   Modules accepted: Orders

## 2022-03-25 ENCOUNTER — Encounter: Payer: Self-pay | Admitting: Internal Medicine

## 2022-03-25 ENCOUNTER — Ambulatory Visit: Payer: BC Managed Care – PPO | Admitting: Internal Medicine

## 2022-03-25 VITALS — BP 128/80 | HR 67 | Ht 66.0 in | Wt 149.6 lb

## 2022-03-25 DIAGNOSIS — Z8639 Personal history of other endocrine, nutritional and metabolic disease: Secondary | ICD-10-CM

## 2022-03-25 DIAGNOSIS — E890 Postprocedural hypothyroidism: Secondary | ICD-10-CM

## 2022-03-25 NOTE — Progress Notes (Addendum)
Patient ID: Dawn Bates, female   DOB: 1970-09-14, 51 y.o.   MRN: [id number]  HPI  Dawn Bates is a 51 y.o.-year-old female, returning for follow-up for post ablative hypothyroidism for Graves ds. and thyroid nodule.  She previously saw Dr. Loanne Drilling, last visit 8 months ago.  Pt. has been dx with hypothyroidism in 07/2020, after RAI treatment for Graves ds. >> on Levothyroxine 25 mcg daily.  She takes the thyroid hormone: - fasting - with water - separated by >30 min from b'fast  - no calcium, iron, PPIs, multivitamins  - on Biotin  I reviewed pt's thyroid tests: Lab Results  Component Value Date   TSH 1.85 07/28/2021   TSH 1.67 04/09/2021   TSH 0.01 (L) 02/05/2021   TSH 10.01 (H) 10/26/2020   TSH <0.01 Repeated and verified X2. (L) 06/24/2020   TSH <0.01 (L) 05/21/2020   TSH 1.27 07/31/2019   FREET4 1.0 07/28/2021   FREET4 1.0 04/09/2021   FREET4 1.5 02/05/2021   FREET4 0.8 10/26/2020   FREET4 5.22 (H) 06/24/2020   FREET4 5.7 (H) 05/21/2020   Antithyroid antibodies: No results found for: "THGAB" No components found for: "TPOAB"  Pt  mentions: - no weight gain - + fatigue - chronic, improved after RAI tx - + cold intolerance, now hot flushes - no depression or anxiety - + constipation - + dry skin - + hair loss/thinning  She had a very small thyroid nodule diagnosed in 2021.  On the latest ultrasound, this has decreased in size: Thyroid U/S (06/08/2020): Slight interval decreased conspicuity of previously visualized 0.6 cm solid nodule in the left inferior thyroid. No new discrete nodules.   IMPRESSION: Similar appearing diffuse thyroid parenchymal heterogeneity with interval decreased hyperemia from August 22, 2019 comparison. No new discrete nodules.  Pt denies feeling nodules in neck, hoarseness, odynophagia. She does have dysphagia -occasionally difficult to even swallow saliva.  She does have a history of allergy and has postnasal  drip.  She has + FH of thyroid disorders in - older sister:  thyroid cancer.  No h/o radiation tx to head or neck  - other than RAI tx. No use of iodine supplements.  Pt. also has a history of hysterectomy in 09/2021 for DUB.  ROS: + See HPI  Past Medical History:  Diagnosis Date   Abnormal uterine bleeding 2022   Anemia 02/12/2021   Patient is taking iron supplementation.   COVID-19 2020   cold-like symptoms   Endometriosis    Fibroids    Graves disease    Hypertension    Hyperthyroidism 2022   s/p Nuclear Medicine RAI therapy on 08/07/20   Seasonal allergies    severe chronic allergies, runny nose, congestion, cough when she lays down due to drainage   Vitamin D deficiency 2021   Wears glasses    Past Surgical History:  Procedure Laterality Date   CYSTOSCOPY  10/04/2021   Procedure: CYSTOSCOPY;  Surgeon: Crawford Givens, MD;  Location: [facility];  Service: Gynecology;;   LAPAROSCOPIC VAGINAL HYSTERECTOMY WITH SALPINGECTOMY N/A 10/04/2021   Procedure: LAPAROSCOPIC ASSISTED VAGINAL HYSTERECTOMY WITH SALPINGECTOMY;  Surgeon: Crawford Givens, MD;  Location: [facility];  Service: Gynecology;  Laterality: N/A;   TUBAL LIGATION  09/2008   Social History   Socioeconomic History   Marital status: Married    Spouse name: Not on file   Number of children: Not on file   Years of education: Not on file   Highest education  level: Not on file  Occupational History   Not on file  Tobacco Use   Smoking status: Never   Smokeless tobacco: Never  Vaping Use   Vaping Use: Never used  Substance and Sexual Activity   Alcohol use: No   Drug use: No   Sexual activity: Yes    Partners: Male    Birth control/protection: Surgical    Comment: BTL   Other Topics Concern   Not on file  Social History Narrative   Not on file   Social Determinants of Health   Financial Resource Strain: Not on file  Food Insecurity: Not on file  Transportation Needs:  Not on file  Physical Activity: Not on file  Stress: Not on file  Social Connections: Not on file  Intimate Partner Violence: Not on file   Current Outpatient Medications on File Prior to Visit  Medication Sig Dispense Refill   amLODipine (NORVASC) 5 MG tablet TAKE 1 TABLET (5 MG TOTAL) BY MOUTH DAILY. 90 tablet 1   docusate sodium (COLACE) 100 MG capsule Take 1 capsule (100 mg total) by mouth 2 (two) times daily. 10 capsule 0   ferrous sulfate 325 (65 FE) MG tablet TAKE 1 TABLET BY MOUTH EVERY DAY 90 tablet 1   ibuprofen (ADVIL) 200 MG tablet Take 3 tablets (600 mg total) by mouth every 6 (six) hours as needed. 30 tablet 0   levocetirizine (XYZAL) 5 MG tablet TAKE 1 TABLET BY MOUTH EVERY DAY IN THE EVENING 90 tablet 2   levothyroxine (SYNTHROID) 25 MCG tablet Take 1 tablet (25 mcg total) by mouth daily. 90 tablet 3   mometasone (NASONEX) 50 MCG/ACT nasal spray Place 2 sprays into the nose daily. 17 g 3   oxyCODONE-acetaminophen (PERCOCET/ROXICET) 5-325 MG tablet Take 1 tablet by mouth every 6 (six) hours as needed for moderate pain. 30 tablet 0   polyethylene glycol (MIRALAX / GLYCOLAX) 17 g packet Take 17 g by mouth daily as needed for mild constipation. 14 each 0   promethazine (PHENERGAN) 12.5 MG tablet Take 1 tablet (12.5 mg total) by mouth every 6 (six) hours as needed for nausea or vomiting. 30 tablet 0   valACYclovir (VALTREX) 500 MG tablet Take 500 mg by mouth as needed.     Vitamin D, Ergocalciferol, (DRISDOL) 1.25 MG (50000 UNIT) CAPS capsule Take 1 capsule (50,000 Units total) by mouth every 7 (seven) days. 12 capsule 0   No current facility-administered medications on file prior to visit.   Allergies  Allergen Reactions   Latex Itching   Family History  Problem Relation Age of Onset   Cancer Sister 66       breast "onset Late 20's"   Breast cancer Sister    Thyroid cancer Sister        pt does not know cell type   Breast cancer Maternal Aunt    PE: There were no  vitals taken for this visit. Wt Readings from Last 3 Encounters:  10/04/21 147 lb 6.4 oz (66.9 kg)  09/29/21 149 lb 8 oz (67.8 kg)  09/28/21 150 lb (68 kg)   Constitutional: normal weight, in NAD Eyes:  EOMI, no exophthalmos ENT: no neck masses, no cervical lymphadenopathy Cardiovascular: RRR, No MRG Respiratory: CTA B Musculoskeletal: no deformities Skin:no rashes Neurological: no tremor with outstretched hands  ASSESSMENT: 1. Hypothyroidism  2.  Thyroid nodules  PLAN:  1. Patient with long-standing hypothyroidism, on low-dose levothyroxine therapy. - latest thyroid labs reviewed with pt. >> normal:  Lab Results  Component Value Date   TSH 1.85 07/28/2021  - she continues on LT4 25 mcg daily - pt feels good on this dose.  She does have fatigue which is not new for her. - we did discuss that she is on such a low dose of levothyroxine, that, if the tests are normal, we may be able to stop it and give her a trial off the medication.  She agrees with this plan.  Alternatively, if the TSH is higher, we may need to increase the dose. - we discussed about taking the thyroid hormone every day, with water, >30 minutes before breakfast, separated by >4 hours from acid reflux medications, calcium, iron, multivitamins. Pt. is taking it correctly. - will check thyroid tests today: TSH and fT4 - If labs are abnormal, she will need to return for repeat TFTs in 1.5 months  2.  Thyroid nodules -No masses felt on palpation of her neck today -Reviewed latest thyroid ultrasound from 06/08/2020.  This showed thyroid heterogeneity, with a decrease in hyperemia and over the left thyroid nodule seen before, decreased in size, less than 0.6 cm. -At this visit, she describes dysphagia.  However, her thyroid is not palpable and she does not have any indication of compression from the thyroid on the esophagus.  I suspect that her dysphagia may be due to her postnasal drip. -No further follow-up is needed  for the thyroid nodules  Component     Latest Ref Rng 03/25/2022  TSH     mIU/L 3.77   T4,Free(Direct)     0.8 - 1.8 ng/dL 1.1    Msg sent: Dear Ms. Clements, Your TSH is normal, but it is slightly higher in the target range.  Therefore, I would recommend to stop the levothyroxine for now, but continue on the same dose. We will repeat your thyroid test at next visit. Sincerely, Philemon Kingdom MD  Philemon Kingdom, MD PhD [facility] Endocrinology

## 2022-03-25 NOTE — Patient Instructions (Signed)
Please continue Levothyroxine 25 mcg daily.  Take the thyroid hormone every day, with water, at least 30 minutes before breakfast, separated by at least 4 hours from: - acid reflux medications - calcium - iron - multivitamins  Please return in 6 months.

## 2022-03-26 LAB — T4, FREE: Free T4: 1.1 ng/dL (ref 0.8–1.8)

## 2022-03-26 LAB — TSH: TSH: 3.77 mIU/L

## 2022-04-03 IMAGING — US US THYROID
1 series · 14 of 25 positions shown · non-contrast
Comparison: 08/22/2019

CLINICAL DATA: Prior ultrasound follow-up.

EXAM:
THYROID ULTRASOUND
TECHNIQUE: Ultrasound examination of the thyroid gland and adjacent soft
tissues was performed.

[Series 1: us thyroid · 0.06mm/px · 14 of 43 slices shown]
[im 1/43]
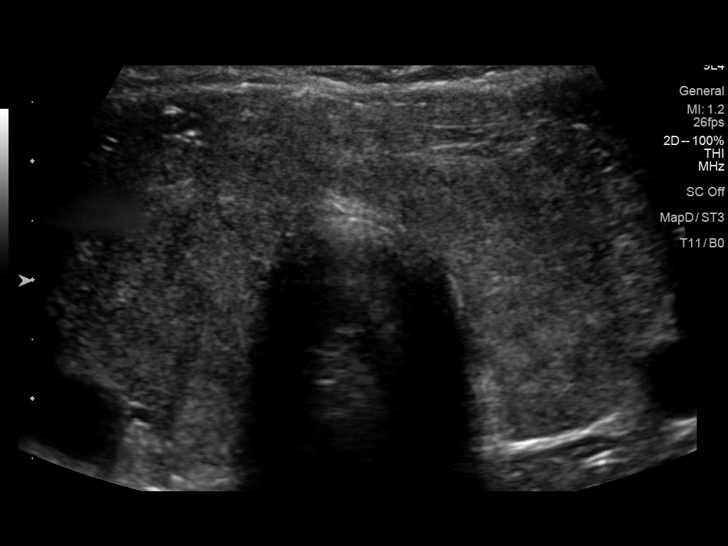
[im 4/43]
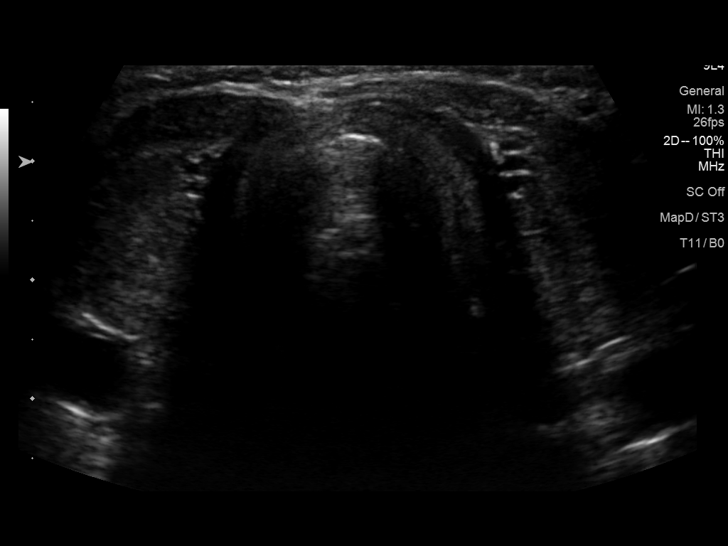
[im 8/43]
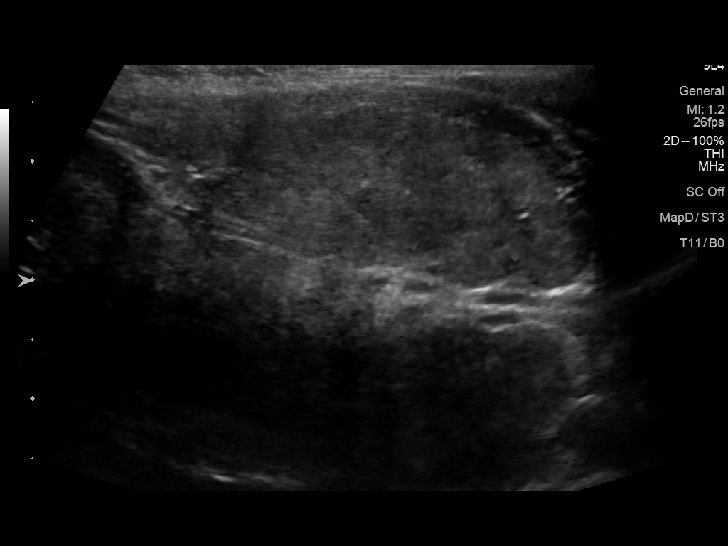
[im 11/43]
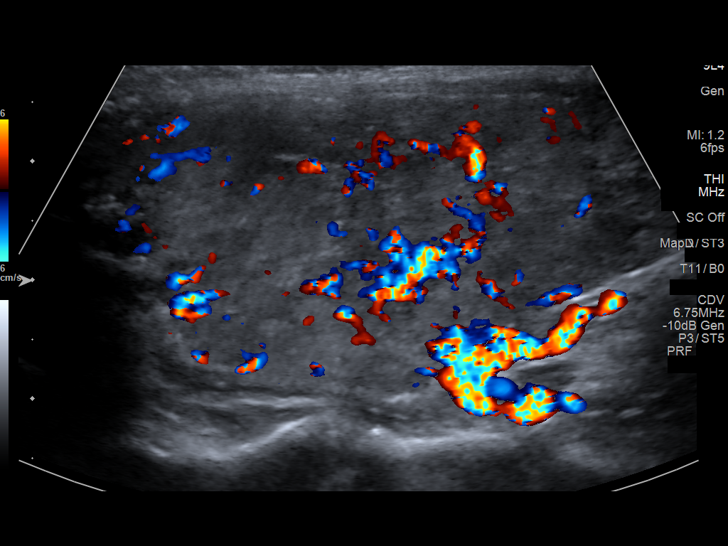
[im 15/43]
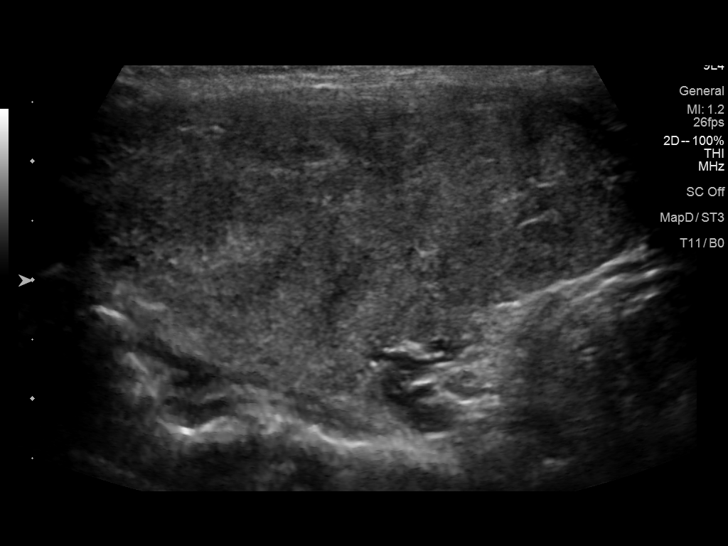
[im 16/43]
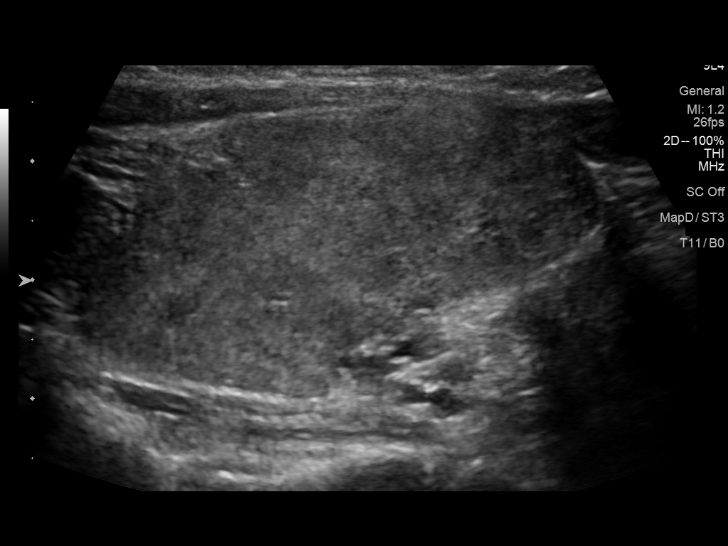
[im 20/43]
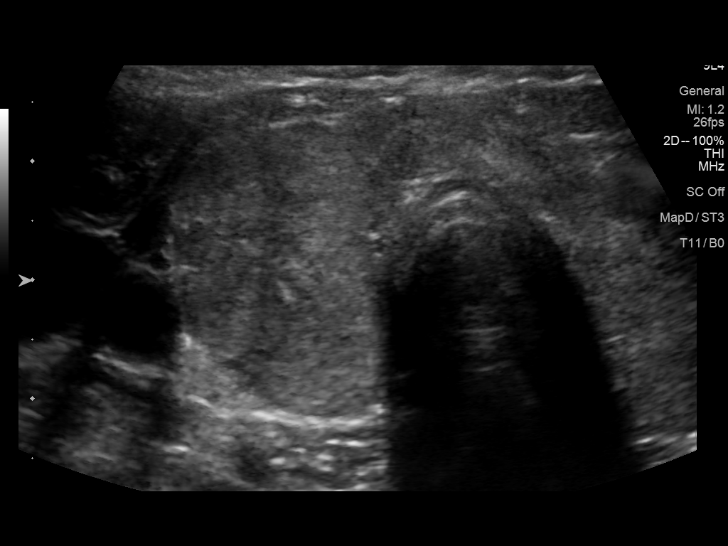
[im 23/43]
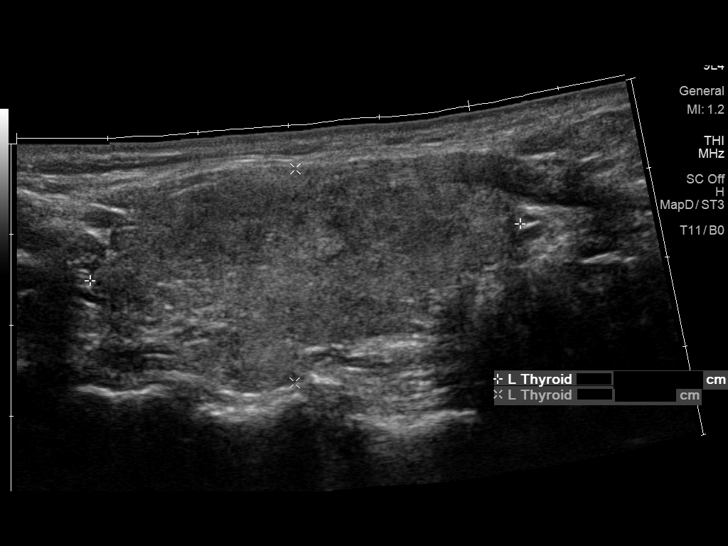
[im 27/43]
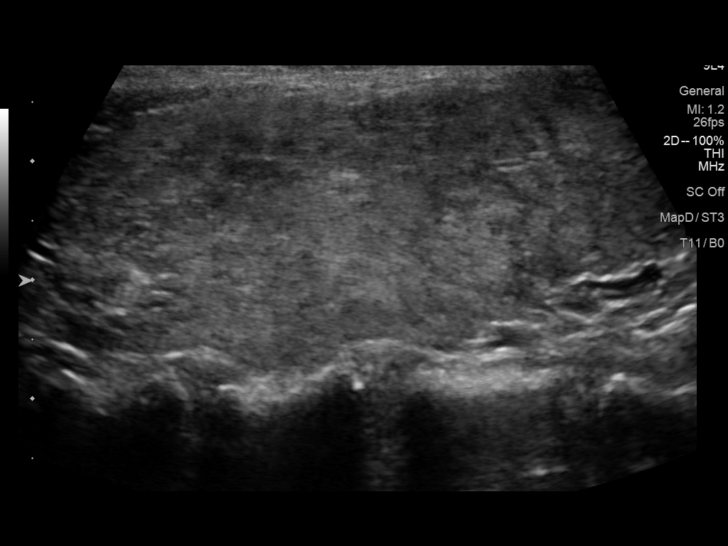
[im 29/43]
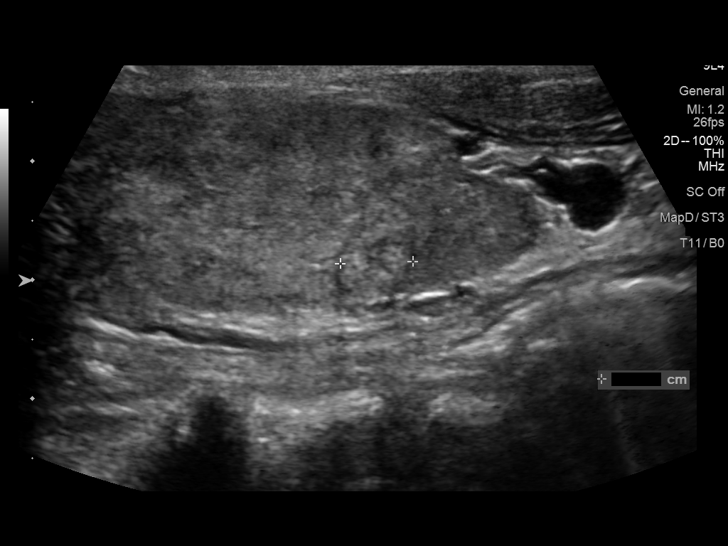
[im 32/43]
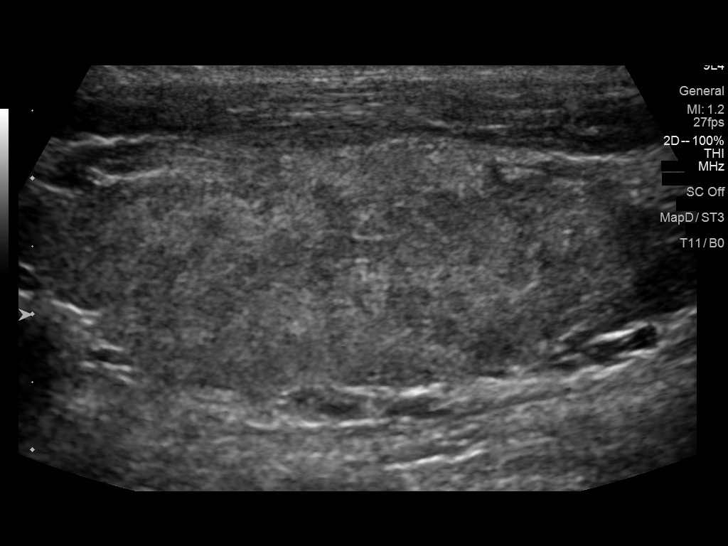
[im 36/43]
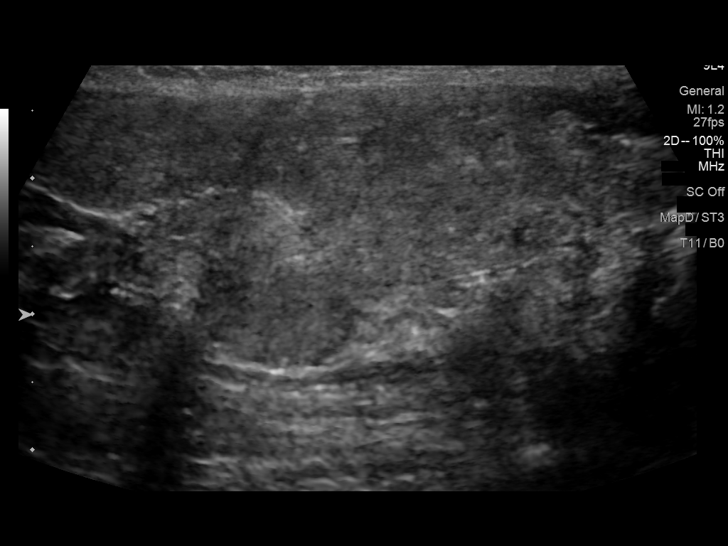
[im 39/43]
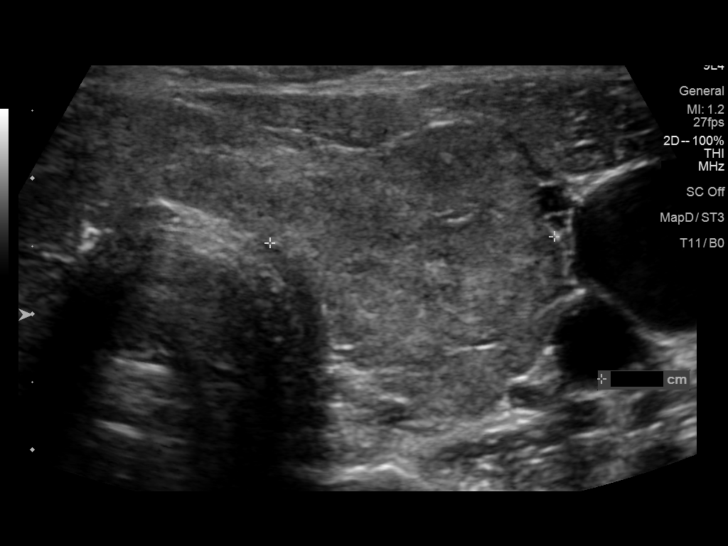
[im 43/43]
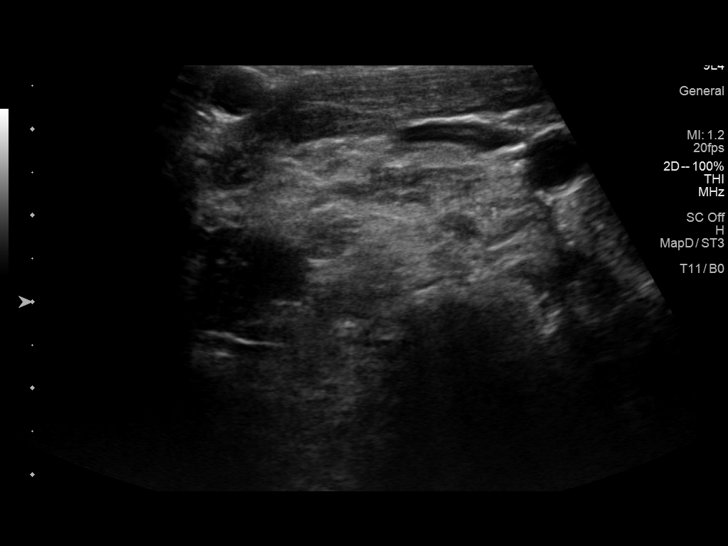

[14 of 25 positions shown; findings below may reference images not displayed]

FINDINGS: Parenchymal Echotexture: Moderately heterogenous. Mildly hyperemic,
decreased from comparison.

Isthmus: 0.8 cm, previously 0.7 cm

Right lobe: 4.7 x 2.5 x 1.7 cm, previously 4.4 x 1.8 x 1.5 cm

Left lobe: 4.8 x 2.4 x 2.1 cm, previously 4.4 x 1.7 x 1.6 cm

_________________________________________________________

Estimated total number of nodules >/= 1 cm: 0

Number of spongiform nodules >/=  2 cm not described below (TR1): 0

Number of mixed cystic and solid nodules >/= 1.5 cm not described
below (TR2): 0

_________________________________________________________

Slight interval decreased conspicuity of previously visualized
cm solid nodule in the left inferior thyroid. No new discrete
nodules.
IMPRESSION: Similar appearing diffuse thyroid parenchymal heterogeneity with
interval decreased hyperemia from August 22, 2019 comparison. No new
discrete nodules.

## 2022-04-12 IMAGING — US US ABDOMEN LIMITED
1 series · 14 of 25 positions shown · non-contrast
Comparison: None.

CLINICAL DATA: Elevated liver enzymes

EXAM:
ULTRASOUND ABDOMEN LIMITED RIGHT UPPER QUADRANT

[Series 1: us abdomen limited · 0.14mm/px · 14 of 43 slices shown]
[im 1/43]
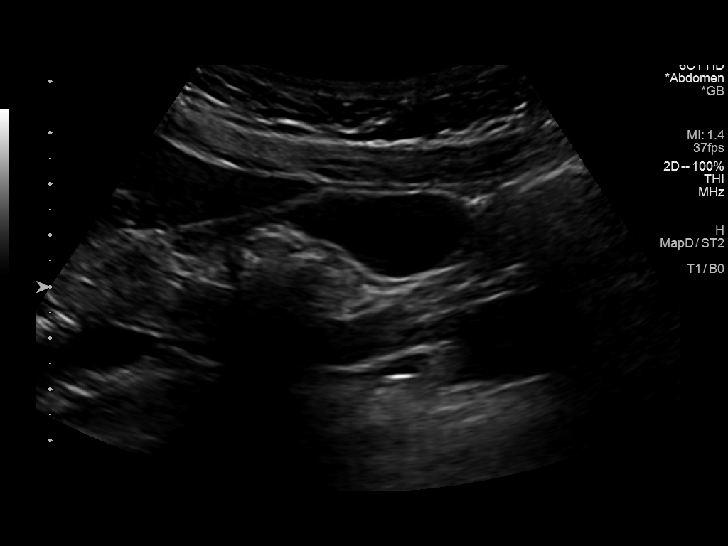
[im 4/43]
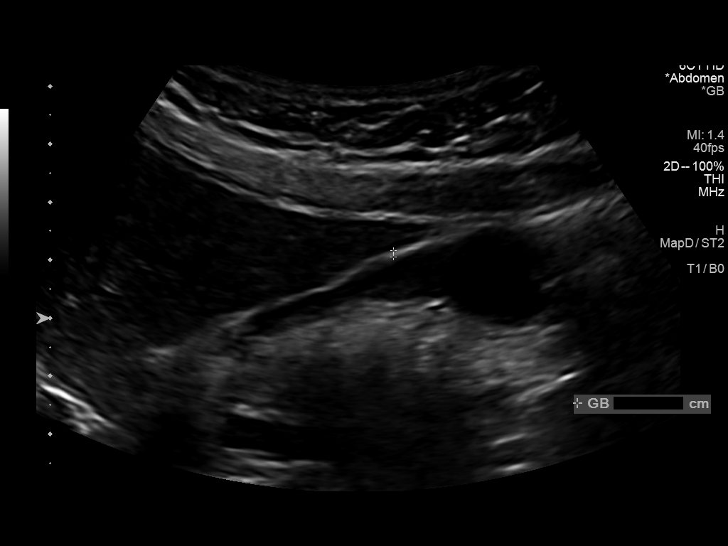
[im 8/43]
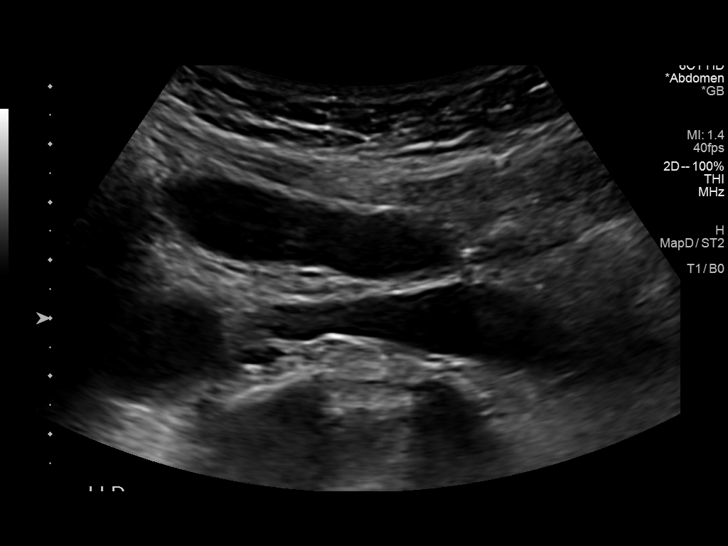
[im 11/43]
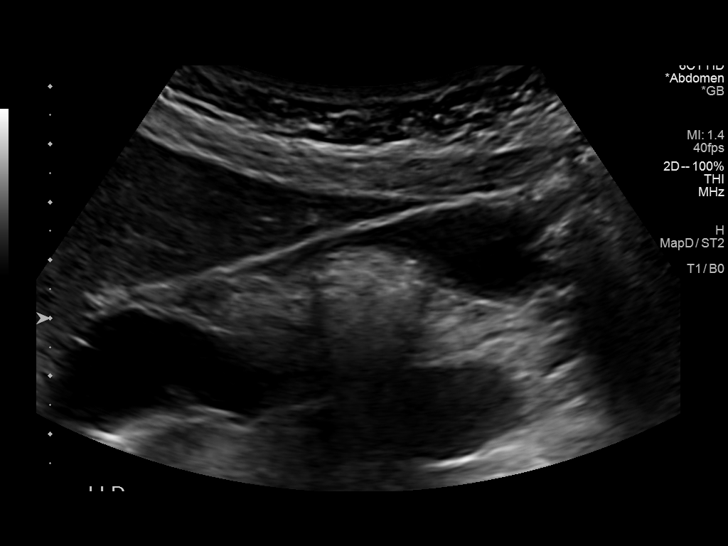
[im 15/43]
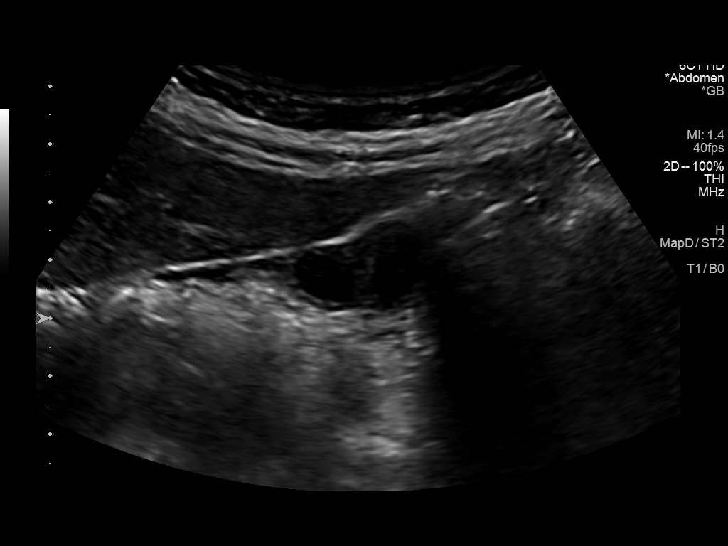
[im 16/43]
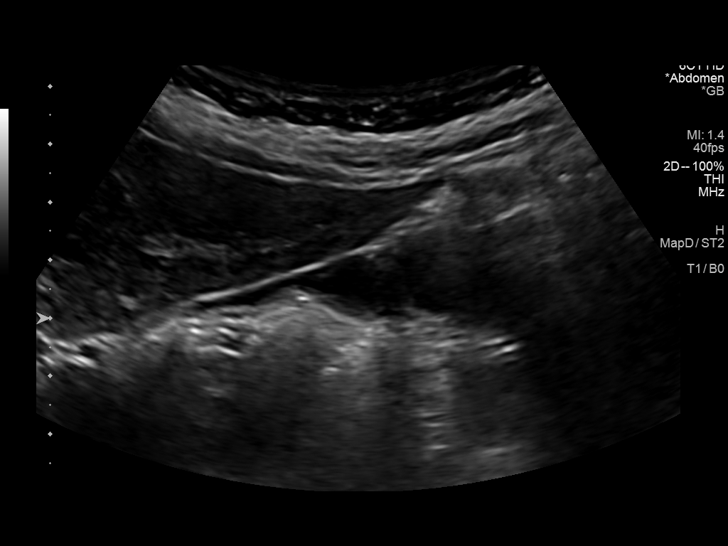
[im 20/43]
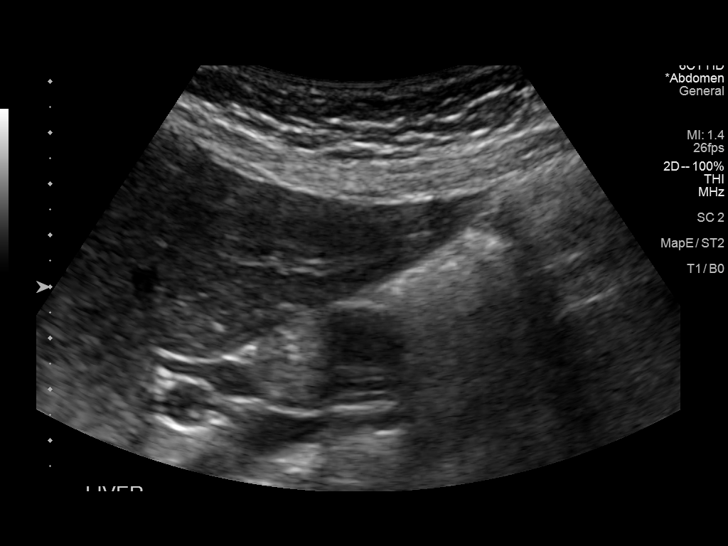
[im 23/43]
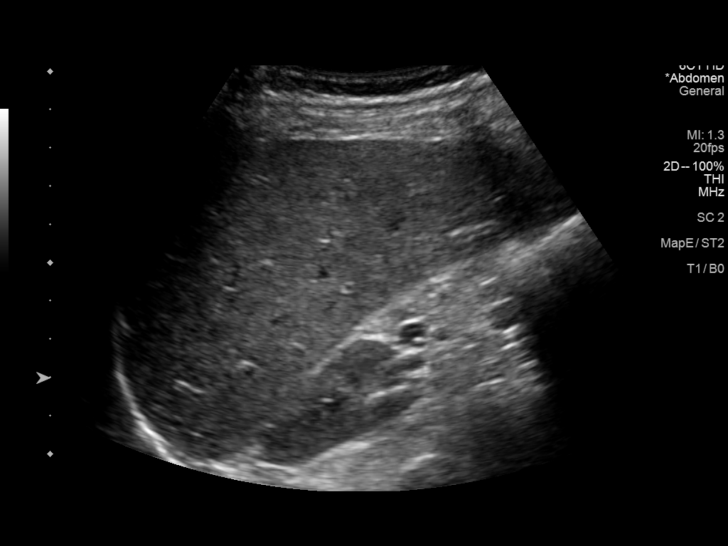
[im 27/43]
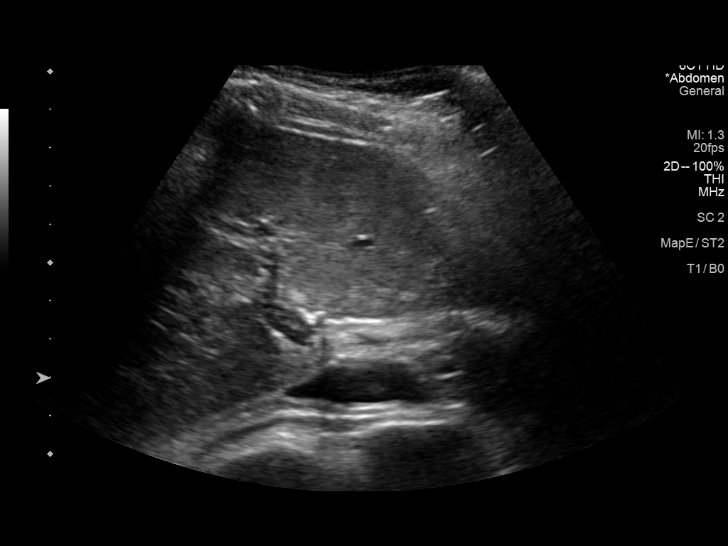
[im 29/43]
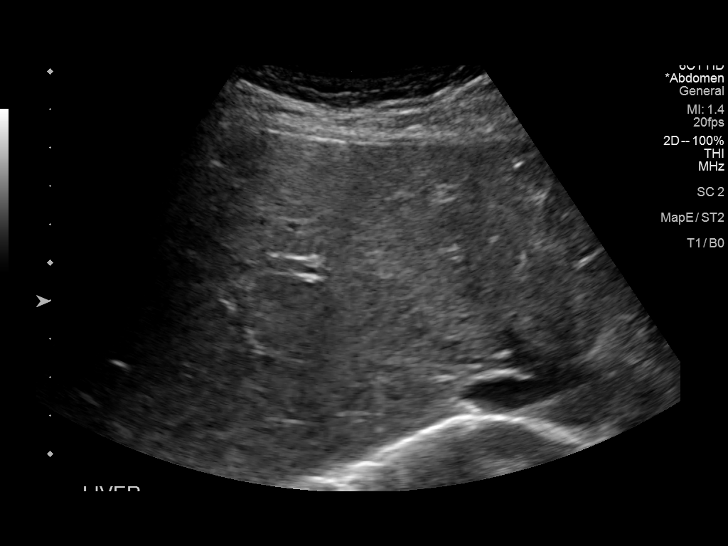
[im 32/43]
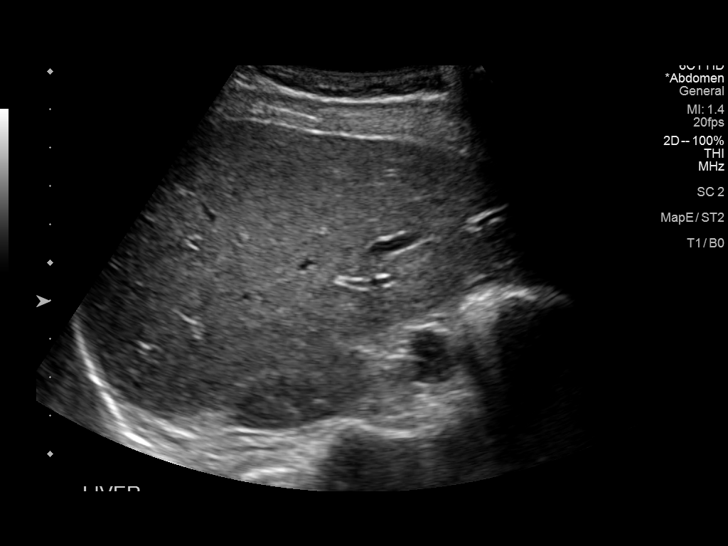
[im 36/43]
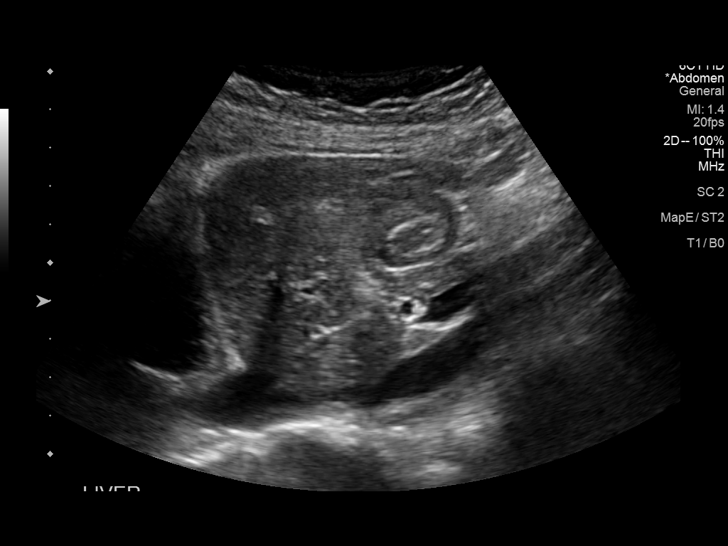
[im 39/43]
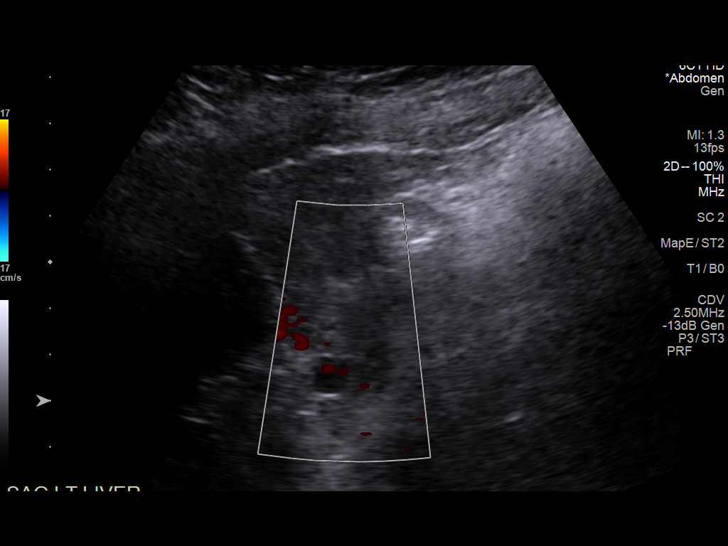
[im 43/43]
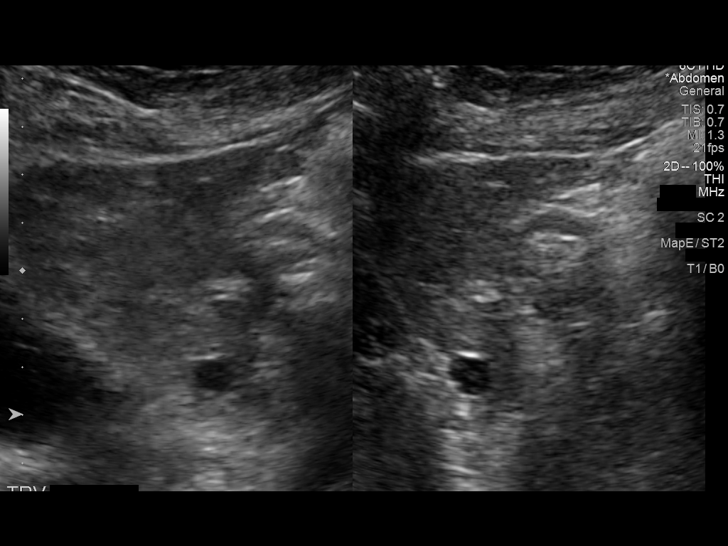

[14 of 25 positions shown; findings below may reference images not displayed]

FINDINGS: Gallbladder:

No gallstones or wall thickening visualized. There is no
pericholecystic fluid. No sonographic Murphy sign noted by
sonographer.

Common bile duct:

Diameter: 5 mm. No intrahepatic or extrahepatic biliary duct
dilatation.

Liver:

There is a cyst in the left lobe of the liver measuring 0.8 x 0.6 x
0.8 cm. Within normal limits in parenchymal echogenicity. Portal
vein is patent on color Doppler imaging with normal direction of
blood flow towards the liver.

Other: None.
IMPRESSION: Subcentimeter cyst left lobe liver.  Study otherwise unremarkable.

## 2022-04-26 ENCOUNTER — Other Ambulatory Visit: Payer: Self-pay | Admitting: Obstetrics and Gynecology

## 2022-04-26 DIAGNOSIS — N6310 Unspecified lump in the right breast, unspecified quadrant: Secondary | ICD-10-CM

## 2022-05-11 ENCOUNTER — Ambulatory Visit: Payer: BC Managed Care – PPO

## 2022-05-11 ENCOUNTER — Ambulatory Visit
Admission: RE | Admit: 2022-05-11 | Discharge: 2022-05-11 | Disposition: A | Discharge status: Home / Self Care | Payer: BC Managed Care – PPO | Source: Ambulatory Visit | Attending: Obstetrics and Gynecology | Admitting: Obstetrics and Gynecology

## 2022-05-11 DIAGNOSIS — N6310 Unspecified lump in the right breast, unspecified quadrant: Secondary | ICD-10-CM

## 2022-05-16 ENCOUNTER — Other Ambulatory Visit: Payer: BC Managed Care – PPO

## 2022-05-26 ENCOUNTER — Other Ambulatory Visit: Payer: Self-pay | Admitting: Obstetrics and Gynecology

## 2022-05-26 ENCOUNTER — Ambulatory Visit
Admission: RE | Admit: 2022-05-26 | Discharge: 2022-05-26 | Disposition: A | Discharge status: Home / Self Care | Payer: BC Managed Care – PPO | Source: Ambulatory Visit | Attending: Obstetrics and Gynecology | Admitting: Obstetrics and Gynecology

## 2022-05-26 DIAGNOSIS — N6310 Unspecified lump in the right breast, unspecified quadrant: Secondary | ICD-10-CM

## 2022-05-30 ENCOUNTER — Other Ambulatory Visit: Payer: Self-pay | Admitting: Internal Medicine

## 2022-05-30 ENCOUNTER — Telehealth: Payer: Self-pay | Admitting: Internal Medicine

## 2022-05-30 DIAGNOSIS — R1310 Dysphagia, unspecified: Secondary | ICD-10-CM

## 2022-05-30 NOTE — Telephone Encounter (Signed)
Pt contacted and advised a Barium swallow was ordered at [facility]. Following those results it can be determined if it related to pt Tyroid or something else. Pt verbalized understanding.

## 2022-05-30 NOTE — Telephone Encounter (Signed)
T, I doubt that this is related to her thyroid, but I did order a Barium swallow to be done in [facility].  If this does not show any compression from the thyroid, she needs to see PCP for possible further referral to gastroenterology for EGD.

## 2022-05-30 NOTE — Telephone Encounter (Signed)
Patient is calling to say that she is still having trouble swallowing and feel as if there is some type of obstruction around her esophagus.  Patient is requesting an appointment for some type of testing-endoscopy or ultrasound or something to that effect.

## 2022-06-06 ENCOUNTER — Ambulatory Visit
Admission: RE | Admit: 2022-06-06 | Discharge: 2022-06-06 | Disposition: A | Discharge status: Home / Self Care | Payer: BC Managed Care – PPO | Source: Ambulatory Visit | Attending: Obstetrics and Gynecology | Admitting: Obstetrics and Gynecology

## 2022-06-06 DIAGNOSIS — N6310 Unspecified lump in the right breast, unspecified quadrant: Secondary | ICD-10-CM

## 2022-06-06 HISTORY — PX: BREAST BIOPSY: SHX20

## 2022-06-07 ENCOUNTER — Ambulatory Visit
Admission: RE | Admit: 2022-06-07 | Discharge: 2022-06-07 | Disposition: A | Discharge status: Home / Self Care | Payer: BC Managed Care – PPO | Source: Ambulatory Visit | Attending: Internal Medicine | Admitting: Internal Medicine

## 2022-06-07 DIAGNOSIS — R1310 Dysphagia, unspecified: Secondary | ICD-10-CM

## 2022-06-21 ENCOUNTER — Other Ambulatory Visit: Payer: Self-pay | Admitting: General Surgery

## 2022-06-21 ENCOUNTER — Telehealth: Payer: Self-pay | Admitting: Genetic Counselor

## 2022-06-21 DIAGNOSIS — Z803 Family history of malignant neoplasm of breast: Secondary | ICD-10-CM | POA: Insufficient documentation

## 2022-06-21 NOTE — Telephone Encounter (Addendum)
[facility] BRCAplus STAT Panel and [facility] CancerNext-Expanded Panel were ordered on 06/21/2022. The saliva sample was collected at Dr. Rosario Jacks office on 06/21/2022.  Lucille Passy, MS, [facility] Genetic Counselor [email or web address] (P) [phone]

## 2022-06-22 ENCOUNTER — Inpatient Hospital Stay: Payer: BC Managed Care – PPO | Attending: Hematology and Oncology | Admitting: Genetic Counselor

## 2022-06-22 ENCOUNTER — Encounter: Payer: Self-pay | Admitting: *Deleted

## 2022-06-22 DIAGNOSIS — Z79899 Other long term (current) drug therapy: Secondary | ICD-10-CM | POA: Insufficient documentation

## 2022-06-22 DIAGNOSIS — Z809 Family history of malignant neoplasm, unspecified: Secondary | ICD-10-CM | POA: Insufficient documentation

## 2022-06-22 DIAGNOSIS — C50911 Malignant neoplasm of unspecified site of right female breast: Secondary | ICD-10-CM

## 2022-06-22 DIAGNOSIS — Z807 Family history of other malignant neoplasms of lymphoid, hematopoietic and related tissues: Secondary | ICD-10-CM | POA: Insufficient documentation

## 2022-06-22 DIAGNOSIS — Z808 Family history of malignant neoplasm of other organs or systems: Secondary | ICD-10-CM | POA: Insufficient documentation

## 2022-06-22 DIAGNOSIS — C50411 Malignant neoplasm of upper-outer quadrant of right female breast: Secondary | ICD-10-CM | POA: Insufficient documentation

## 2022-06-22 DIAGNOSIS — Z170 Estrogen receptor positive status [ER+]: Secondary | ICD-10-CM | POA: Insufficient documentation

## 2022-06-22 DIAGNOSIS — Z803 Family history of malignant neoplasm of breast: Secondary | ICD-10-CM | POA: Insufficient documentation

## 2022-06-22 DIAGNOSIS — Z8042 Family history of malignant neoplasm of prostate: Secondary | ICD-10-CM | POA: Insufficient documentation

## 2022-06-23 ENCOUNTER — Other Ambulatory Visit: Payer: BC Managed Care – PPO

## 2022-06-23 ENCOUNTER — Telehealth: Payer: Self-pay | Admitting: Internal Medicine

## 2022-06-23 ENCOUNTER — Encounter: Payer: Self-pay | Admitting: Genetic Counselor

## 2022-06-23 ENCOUNTER — Encounter: Payer: BC Managed Care – PPO | Admitting: Genetic Counselor

## 2022-06-23 NOTE — Telephone Encounter (Signed)
T, I am not sure what is going on.  This is the second patient to which I sent the results as well as the imaging report returned and they did not receive it... Please let her know:  Edit Comments   Add Notifications    Dear Ms. Resetar, The barium swallow study was normal.  In that case, please discuss with your primary care doctor about the swallowing problems, if they are still present. Sincerely, Philemon Kingdom MD  Written by Philemon Kingdom, MD on 06/07/2022 12:44 PM EST

## 2022-06-23 NOTE — Progress Notes (Signed)
REFERRING PROVIDER: Stark Klein, MD [address] [place],  [place] [address]   PRIMARY PROVIDER:  [place], [name], MD  PRIMARY REASON FOR VISIT:  1. Malignant neoplasm of right breast in female, estrogen receptor positive, unspecified site of breast ([place])   2. Family history of breast cancer     HISTORY OF PRESENT ILLNESS:   Dawn Bates, a 52 y.o. female, was seen for a [facility] cancer genetics consultation at the request of Dr. Barry Dienes due to a personal family history of breast cancer.  Dawn Bates presents to clinic today to discuss the possibility of a hereditary predisposition to cancer, to discuss genetic testing, and to further clarify her future cancer risks, as well as potential cancer risks for family members.   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 The treatment plan is pending.    Past Medical History:  Diagnosis Date   Abnormal uterine bleeding 2022   Anemia 02/12/2021   Patient is taking iron supplementation.   COVID-19 2020   cold-like symptoms   Endometriosis    Fibroids    Graves disease    Hypertension    Hyperthyroidism 2022   s/p Nuclear Medicine RAI therapy on 08/07/20   Seasonal allergies    severe chronic allergies, runny nose, congestion, cough when she lays down due to drainage   Vitamin D deficiency 2021   Wears glasses     Past Surgical History:  Procedure Laterality Date   BREAST BIOPSY Right 06/06/2022   [place] RT BREAST BX W LOC DEV 1ST LESION IMG BX SPEC US GUIDE 06/06/2022 [facility]   CYSTOSCOPY  10/04/2021   Procedure: CYSTOSCOPY;  Surgeon: Crawford Givens, MD;  Location: [facility];  Service: Gynecology;;   LAPAROSCOPIC VAGINAL HYSTERECTOMY WITH SALPINGECTOMY N/A 10/04/2021   Procedure: LAPAROSCOPIC ASSISTED VAGINAL HYSTERECTOMY WITH SALPINGECTOMY;  Surgeon: Crawford Givens, MD;  Location: [facility];  Service: Gynecology;  Laterality: N/A;   TUBAL LIGATION  09/2008    FAMILY HISTORY:  We obtained a detailed, 4-generation family history.  Significant diagnoses are listed below: Family History  Problem Relation Age of Onset   Breast cancer Sister 20       triple negative; d. early 85s   Cancer Paternal Aunt        unknown type   Thyroid cancer Half-Sister        mat half sister; dx 62s; unknown cell type     Dawn Bates reported that her sister Elvin So, who passed away from breast cancer in her 52s, had negative genetic testing before 2010 (likely only BRCA1/2 only).  No report was available for review today.  She is unaware of other previous family history of genetic testing for hereditary cancer risks. There is no reported Ashkenazi Jewish ancestry. There is no known consanguinity.  GENETIC COUNSELING ASSESSMENT: Dawn Bates is a 51 y.o. female with a personal and family history which is somewhat suggestive of a hereditary cancer syndrome and predisposition to cancer given the her age of diagnosis and her sister's diagnoses of triple negative breast cancer before age 69. We, therefore, discussed and recommended the following at today's visit.   DISCUSSION: We discussed that 5 - 10% of cancer is hereditary.  Most cases of hereditary breast cancer are associated with mutations in BRCA1/2.  There are other genes that can be associated with hereditary breast or thyroid cancer  syndromes.  We discussed that testing is beneficial for several reasons including knowing how to follow individuals for their cancer risks, identifying whether potential treatment options would be beneficial, and understanding if other family members could be at risk for cancer and allowing them to undergo genetic testing.   We reviewed the characteristics, features and inheritance patterns of hereditary cancer syndromes. We also discussed genetic testing, including the appropriate family members to test, the  process of testing, insurance coverage and turn-around-time for results. We discussed the implications of a negative, positive, and/or variant of uncertain significant result. We recommended Dawn Bates pursue genetic testing for a panel that includes genes associated with breast and thyroid cancer cancer.  We recommended the [facility] BRCAPlus STAT Panel, in order to have results back to help with surgical decisions.  The BRCAplus panel offered by [employer] and includes sequencing and deletion/duplication analysis for the following 8 genes: ATM, BRCA1, BRCA2, CDH1, CHEK2, PALB2, PTEN, and TP53.  We recommended reflex to more comprehensive panel.   The CancerNext-Expanded gene panel offered by [facility] and includes sequencing and rearrangement analysis for the following 77 genes: AIP, ALK, APC, ATM, AXIN2, BAP1, BARD1, BLM, BMPR1A, BRCA1, BRCA2, BRIP1, CDC73, CDH1, CDK4, CDKN1B, CDKN2A, CHEK2, CTNNA1, DICER1, FANCC, FH, FLCN, GALNT12, KIF1B, LZTR1, MAX, MEN1, MET, MLH1, MSH2, MSH3, MSH6, MUTYH, NBN, NF1, NF2, NTHL1, PALB2, PHOX2B, PMS2, POT1, PRKAR1A, PTCH1, PTEN, RAD51C, RAD51D, RB1, RECQL, RET, SDHA, SDHAF2, SDHB, SDHC, SDHD, SMAD4, SMARCA4, SMARCB1, SMARCE1, STK11, SUFU, TMEM127, TP53, TSC1, TSC2, VHL and XRCC2 (sequencing and deletion/duplication); EGFR, EGLN1, HOXB13, KIT, MITF, PDGFRA, POLD1, and POLE (sequencing only); EPCAM and GREM1 (deletion/duplication only).   PLAN: After considering the risks, benefits, and limitations, Dawn Bates provided informed consent to pursue genetic testing and the saliva sample, which was collected at [facility] on 06/21/2022, was sent to [facility] for analysis of the BRCAPlus and CancerNext-Expanded Panel. Results should be available within approximately 1-2 weeks' time, at which point they will be disclosed by telephone to Dawn Bates, as will any additional recommendations warranted by these results. Dawn Bates will receive a summary of  her genetic counseling visit and a copy of her results once available. This information will also be available in Epic.   Dawn Bates questions were answered to her satisfaction today. Our contact information was provided should additional questions or concerns arise. Thank you for the referral and allowing [place] to share in the care of your patient.   Dawn Bates, [place], [facility] Genetic Counselor [email or web address] (P) [phone]  The patient was seen for a total of 14 minutes in telephone genetic counseling.

## 2022-06-23 NOTE — Progress Notes (Signed)
Location of Breast Cancer: Malignant neoplasm of upper-outer quadrant of right breast in female, estrogen receptor positive   Histology per Pathology Report:  Diagnosis Breast, right, needle core biopsy, 10 o'clock, 6cmfn INVASIVE DUCTAL CARCINOMA, SEE NOTE TUBULE FORMATION: SCORE 3 NUCLEAR PLEOMORPHISM: SCORE 2 MITOTIC COUNT: SCORE 2 TOTAL SCORE: 7 OVERALL GRADE: 2 LYMPHOVASCULAR INVASION: NOT IDENTIFIED CANCER LENGTH: 1.1 CM CALCIFICATIONS: NOT IDENTIFIED OTHER FINDINGS: NUMEROUS INFILTRATING LYMPHOCYTES SEE NOTE Diagnosis Note Dr. Alric Seton reviewed the case and concurs with the interpretation. A breast prognostic profile (ER, PR, Ki-67 and HER2) is pending and will be reported in an addendum. [place] was notified on 06/07/2022. Claudette Laws MD Pathologist, Electronic Signature (Case signed 06/07/2022)  Receptor Status:  The tumor cells are positive for Her2 (3+). Estrogen Receptor: 100%, POSITIVE, STRONG STAINING INTENSITY Progesterone Receptor: 5%, POSITIVE, STRONG STAINING INTENSITY Proliferation Marker Ki67: 50%  Did patient present with symptoms (if so, please note symptoms) or was this found on screening mammography?: She had a palpable mass in the right breast detected by spouse.   Past/Anticipated interventions by surgeon, if any: 06-21-22 Dr. Barry Dienes Assessment and Plan:   ICD-10-CM  1. Malignant neoplasm of upper-outer quadrant of right breast in female, estrogen receptor positive C50.411 Ambulatory Referral to Oncology-Medical  Z17.0 Ambulatory Referral to Loop   2. Family history of breast cancer Z80.3   Patient is 36 with a new diagnosis of clinical T1c N0 right breast cancer, triple positive. I am referring the patient urgently to the cancer center and to genetics. Given her sisters young age at diagnosis and her young age at diagnosis, we are sending stat genetics panel today.  Barring unforeseen genetic findings, we  are going to plan lumpectomy with sentinel node biopsy and port placement. She will need to discuss chemotherapy with oncology preoperatively to make sure that she is on board for port placement.  I discussed that her treatment would consist of surgery followed by chemotherapy followed by radiation, and then antihormonal treatment. I discussed that the entire and HER2 treatment will likely go on for a year.  Given the normal lymph nodes and the small size of tumor, I had not planned to order staging studies.  The mass in the breast is close enough to the axilla that I think we may be able to do this through 1 axillary incision.  I discussed risks of surgery including bleeding, infection, chronic pain, fluid buildup in the form of seroma or lymphedema, restricted range of motion, possible need for additional procedures or surgeries, possible dissatisfaction with breast appearance, possible heart or lung issues, possible prolonged time off of work, possible clot, and others. I discussed the risk of pneumothorax with port placement.   I reviewed day of surgery events including the block and the nipple injection for the sentinel node biopsy. I reviewed that this is an outpatient surgery. I discussed that she would have 1 to 2 weeks of lifting restrictions. The patient and her spouse desire to do this as soon as possible.  Milus Height, MD FACS Surgical Oncology, General Surgery, Trauma and [facility]   Past/Anticipated interventions by medical oncology, if any: Pt sees Dr Chryl Heck on 06-27-22  Lymphedema issues, if any:  none  Pain issues, if any:  none  SAFETY ISSUES: Prior radiation? Yes, for Thyroid in 2019 or 2020 Pacemaker/ICD? no Possible current pregnancy? No, hysterectomy Is the patient on methotrexate? no  Current Complaints / other  details:  details of radiation and procedure, will have lumpectomy on 07-05-21.   Vitals:   06/28/22  0932  BP: 116/87  Pulse: 71  Resp: 18  Temp: (!) 96.3 F (35.7 C)  SpO2: 100%

## 2022-06-23 NOTE — Telephone Encounter (Signed)
Patient is calling to see if the results from her "swallow test" have been received.  Patient states that the testing was done in December 2023.

## 2022-06-24 NOTE — Telephone Encounter (Signed)
T, Based on the previous investigation, the thyroid does not contain any worrisome nodules and is inconspicuous.  Also, it does not appear to compress the esophagus.  Therefore, her problems swallowing do not appear to be related to the thyroid.  Please have her discuss with PCP to see if she needs to be referred to ENT or GI for further investigation.

## 2022-06-24 NOTE — Telephone Encounter (Signed)
Called and left a vm for pt to call back to discuss results.

## 2022-06-24 NOTE — Telephone Encounter (Signed)
Pt was advised of results. Pt did want to let you know she was recently diagnosed with Breast Cancer in December. Pt also wanted to know if she should have a u/s to see if the radiation treatment she did helped shrink the nodules. Pt also wanted to know if she could/should have the nodules removed and biopsied. Her sister recently had her biopsied and found cancer. Pt is insistent something is going on. She feels like something is pushing on her esophagus. Pt requested a call back and verbalized permission to leave a detailed message if she can not pick up.

## 2022-06-27 ENCOUNTER — Inpatient Hospital Stay (HOSPITAL_BASED_OUTPATIENT_CLINIC_OR_DEPARTMENT_OTHER): Payer: BC Managed Care – PPO | Admitting: Hematology and Oncology

## 2022-06-27 ENCOUNTER — Other Ambulatory Visit: Payer: Self-pay

## 2022-06-27 ENCOUNTER — Encounter: Payer: Self-pay | Admitting: Genetic Counselor

## 2022-06-27 ENCOUNTER — Telehealth: Payer: Self-pay | Admitting: Genetic Counselor

## 2022-06-27 VITALS — BP 132/83 | HR 70 | Temp 97.7°F | Resp 18 | Wt 139.3 lb

## 2022-06-27 DIAGNOSIS — Z807 Family history of other malignant neoplasms of lymphoid, hematopoietic and related tissues: Secondary | ICD-10-CM

## 2022-06-27 DIAGNOSIS — Z8042 Family history of malignant neoplasm of prostate: Secondary | ICD-10-CM

## 2022-06-27 DIAGNOSIS — Z170 Estrogen receptor positive status [ER+]: Secondary | ICD-10-CM | POA: Diagnosis not present

## 2022-06-27 DIAGNOSIS — C50411 Malignant neoplasm of upper-outer quadrant of right female breast: Secondary | ICD-10-CM

## 2022-06-27 DIAGNOSIS — Z808 Family history of malignant neoplasm of other organs or systems: Secondary | ICD-10-CM

## 2022-06-27 DIAGNOSIS — Z809 Family history of malignant neoplasm, unspecified: Secondary | ICD-10-CM

## 2022-06-27 DIAGNOSIS — Z803 Family history of malignant neoplasm of breast: Secondary | ICD-10-CM

## 2022-06-27 DIAGNOSIS — Z79899 Other long term (current) drug therapy: Secondary | ICD-10-CM | POA: Diagnosis not present

## 2022-06-27 NOTE — Telephone Encounter (Signed)
Saliva sample from [facility] has not yet arrived at [facility].  Contacted [facility] for tracking number.  Called patient to schedule blood draw.  Unable to come back to [facility] today for lab.  Lab scheduled for tomorrow after Rad Onc patient.  Patient prefers results prior to surgery if possible.  Patient plans to request to changes surgery date to week of 1/22 due to work reasons.

## 2022-06-27 NOTE — Progress Notes (Unsigned)
[facility] CONSULT NOTE  Patient Care Team: [place], [name], MD as PCP - General (Family Medicine) Mauro Kaufmann, RN as Oncology Nurse Navigator Rockwell Germany, RN as Oncology Nurse Navigator Benay Pike, MD as Consulting Physician (Hematology and Oncology)  CHIEF COMPLAINTS/PURPOSE OF CONSULTATION:  Newly diagnosed breast cancer  HISTORY OF PRESENTING ILLNESS:  Dawn Bates 52 y.o. female is here because of recent diagnosis of right   I reviewed her records extensively and collaborated the history with the patient.  SUMMARY OF ONCOLOGIC HISTORY: Oncology History  Malignant neoplasm of upper-outer quadrant of right breast in female, estrogen receptor positive ([place])  05/26/2022 Mammogram   Diagnostic mammogram after patient noticed a palpable lump in the right breast showed suspicious mass at the palpable site of concern in the right breast at 10:00 measuring 1.2 cm. Ultrasound of the right breast showed no evidence of lymphadenopathy.   06/06/2022 Pathology Results   Right breast needle core biopsy at 10:00 showed overall grade 2 invasive ductal carcinoma.  Prognostic showed ER 100% positive strong staining PR 5% positive strong staining, Ki-67 of 50% HER2 3+ by [facility]   06/28/2022 Initial Diagnosis   Malignant neoplasm of upper-outer quadrant of right breast in female, estrogen receptor positive ([name])     Patient arrived to the appointment today with her husband.  Her husband had to record her conversation so she can review it when she has questions. Patient has also already seen Dr. Barry Dienes for surgery was going to see Dr. Isidore Moos .  She tells me that she has felt this lump right before Thanksgiving.  She has family history significant for breast cancer, younger sister had breast cancer at 73 and died from 32, had triple negative breast cancer.  She also has a maternal aunt who had breast cancer.  She has 3 kids.  She is a Education officer, museum by occupation.  At  baseline she has Graves' disease, hypertension, had radioactive iodine treatment in February 2022, otherwise denies any major health issues.   Rest of the pertinent 10 point ROS reviewed and negative  MEDICAL HISTORY:  Past Medical History:  Diagnosis Date   Abnormal uterine bleeding 2022   Anemia 02/12/2021   Patient is taking iron supplementation.   Cancer [facility])    breast   COVID-19 2020   cold-like symptoms   Endometriosis    Fibroids    Graves disease    Hypertension    Hyperthyroidism 2022   s/p Nuclear Medicine RAI therapy on 08/07/20   Seasonal allergies    severe chronic allergies, runny nose, congestion, cough when she lays down due to drainage   Vitamin D deficiency 2021   Wears glasses     SURGICAL HISTORY: Past Surgical History:  Procedure Laterality Date   BREAST BIOPSY Right 06/06/2022   [place] RT BREAST BX W LOC DEV 1ST LESION IMG BX SPEC US GUIDE 06/06/2022 [facility] MAMMOGRAPHY   CYSTOSCOPY  10/04/2021   Procedure: CYSTOSCOPY;  Surgeon: Crawford Givens, MD;  Location: [facility];  Service: Gynecology;;   LAPAROSCOPIC VAGINAL HYSTERECTOMY WITH SALPINGECTOMY N/A 10/04/2021   Procedure: LAPAROSCOPIC ASSISTED VAGINAL HYSTERECTOMY WITH SALPINGECTOMY;  Surgeon: Crawford Givens, MD;  Location: [facility];  Service: Gynecology;  Laterality: N/A;   TUBAL LIGATION  09/2008    SOCIAL HISTORY: Social History   Socioeconomic History   Marital status: Married    Spouse name: Not on file   Number of children: Not on file  Years of education: Not on file   Highest education level: Not on file  Occupational History   Not on file  Tobacco Use   Smoking status: Never   Smokeless tobacco: Never  Vaping Use   Vaping Use: Never used  Substance and Sexual Activity   Alcohol use: No   Drug use: No   Sexual activity: Yes    Partners: Male    Birth control/protection: Surgical    Comment: BTL   Other Topics Concern   Not on file  Social  History Narrative   Not on file   Social Determinants of Health   Financial Resource Strain: Not on file  Food Insecurity: Not on file  Transportation Needs: Not on file  Physical Activity: Not on file  Stress: Not on file  Social Connections: Not on file  Intimate Partner Violence: Not on file    FAMILY HISTORY: Family History  Problem Relation Age of Onset   Breast cancer Sister 72       triple negative; d. early 57s   Cancer Paternal Aunt        unknown type   Thyroid cancer Half-Sister        mat half sister; dx 82s; unknown cell type   Maternal uncle with prostate cancer, multiple myeloma.  ALLERGIES:  is allergic to latex.  MEDICATIONS:  Current Outpatient Medications  Medication Sig Dispense Refill   amLODipine (NORVASC) 5 MG tablet TAKE 1 TABLET (5 MG TOTAL) BY MOUTH DAILY. 90 tablet 1   oxyCODONE-acetaminophen (PERCOCET/ROXICET) 5-325 MG tablet Take 1 tablet by mouth every 6 (six) hours as needed for moderate pain. (Patient not taking: Reported on 06/28/2022) 30 tablet 0   promethazine (PHENERGAN) 12.5 MG tablet Take 1 tablet (12.5 mg total) by mouth every 6 (six) hours as needed for nausea or vomiting. 30 tablet 0   valACYclovir (VALTREX) 500 MG tablet Take 500 mg by mouth as needed.     Vitamin D, Ergocalciferol, (DRISDOL) 1.25 MG (50000 UNIT) CAPS capsule Take 1 capsule (50,000 Units total) by mouth every 7 (seven) days. 12 capsule 0   No current facility-administered medications for this visit.    REVIEW OF SYSTEMS:   Constitutional: Denies fevers, chills or abnormal night sweats Eyes: Denies blurriness of vision, double vision or watery eyes Ears, nose, mouth, throat, and face: Denies mucositis or sore throat Respiratory: Denies cough, dyspnea or wheezes Cardiovascular: Denies palpitation, chest discomfort or lower extremity swelling Gastrointestinal:  Denies nausea, heartburn or change in bowel habits Skin: Denies abnormal skin rashes Lymphatics: Denies  new lymphadenopathy or easy bruising Neurological:Denies numbness, tingling or new weaknesses Behavioral/Psych: Mood is stable, no new changes  Breast:  Denies any palpable lumps or discharge All other systems were reviewed with the patient and are negative.  PHYSICAL EXAMINATION: ECOG PERFORMANCE STATUS: 0 - Asymptomatic  Vitals:   06/27/22 1035  BP: 132/83  Pulse: 70  Resp: 18  Temp: 97.7 F (36.5 C)  SpO2: 100%   Filed Weights   06/27/22 1035  Weight: 139 lb 5 oz (63.2 kg)    GENERAL:alert, no distress and comfortable SKIN: skin color, texture, turgor are normal, no rashes or significant lesions EYES: normal, conjunctiva are pink and non-injected, sclera clear OROPHARYNX:no exudate, no erythema and lips, buccal mucosa, and tongue normal  NECK: supple, thyroid normal size, non-tender, without nodularity LYMPH:  no palpable lymphadenopathy in the cervical, axillary  LUNGS: clear to auscultation and percussion with normal breathing effort HEART: regular rate &  rhythm and no murmurs and no lower extremity edema ABDOMEN:abdomen soft, non-tender and normal bowel sounds Musculoskeletal:no cyanosis of digits and no clubbing  PSYCH: alert & oriented x 3 with fluent speech NEURO: no focal motor/sensory deficits BREAST: Palpable small nodule in the right breast upper outer quadrant measuring approximately a centimeter or greater.  No palpable regional adenopathy  LABORATORY DATA:  I have reviewed the data as listed Lab Results  Component Value Date   WBC 9.5 10/05/2021   HGB 8.6 (L) 10/05/2021   HCT 28.5 (L) 10/05/2021   MCV 77.0 (L) 10/05/2021   PLT 177 10/05/2021   Lab Results  Component Value Date   NA 134 (L) 10/05/2021   K 4.1 10/05/2021   CL 104 10/05/2021   CO2 24 10/05/2021    RADIOGRAPHIC STUDIES: I have personally reviewed the radiological reports and agreed with the findings in the report.  ASSESSMENT AND PLAN:  Malignant neoplasm of upper-outer quadrant  of right breast in female, estrogen receptor positive ([place]) This is a very pleasant 52 year old premenopausal female patient with newly diagnosed right breast invasive ductal carcinoma T1 N0 M0 ER/PR and HER2 amplified referred to medical oncology for recommendations.  She arrived today to the appointment with her husband.  Given triple positive disease, we have discussed about considering adjuvant chemotherapy.  We have discussed about effects of neoadjuvant chemotherapy which may lead to complete pathological response which may shed light on prognostic information as well as also help tailor therapy if she did not achieve a complete pathological response.  She is not interested in neoadjuvant chemotherapy.  She however was clearly informed that adjuvant chemotherapy will be of benefit.  We have briefly discussed about 2 possible regimens which is trastuzumab with paclitaxel versus TCH which is carboplatin/docetaxel with trastuzumab.  Since she is young and without any major medical comorbidities TCH will be my preferred adjuvant regimen.  We have discussed that [place] could be considered in patients with low risk disease as well.  We have briefly discussed about adverse effects of chemotherapy including but not limited to fatigue, nausea, vomiting, diarrhea, increased risk of infections, neuropathy, cardiotoxicity.  We have discussed that some of the side effects can be permanent.  She had several good questions and all her questions were answered to the best my knowledge.  She had some questions regarding postponing surgery and genetic testing results.  Sent in basket message to genetics team who will coordinate genetic testing with her.  I encouraged her to call Dr. Marlowe Aschoff office to discuss postponement of surgery.  After adjuvant chemotherapy, depending on the type of surgery she chooses, she will proceed with radiation.  She had questions about role of mastectomy versus lumpectomy.  She understands that  despite lumpectomy or mastectomy we may still recommend adjuvant chemotherapy.  After completion of radiation, she will then proceed on with adjuvant antiestrogen therapy.  Thank you for consulting [place] in the care of this patient.  Please do not hesitate to contact us with any additional questions or concerns.  Total time spent: 60 minutes including history, physical exam, review of records, counseling and coordination of care All questions were answered. The patient knows to call the clinic with any problems, questions or concerns.    Benay Pike, MD 06/28/22

## 2022-06-27 NOTE — Progress Notes (Signed)
Radiation Oncology         [phone] ________________________________  Initial Outpatient Consultation  Name: Dawn Bates MRN: [id number]  Date: 06/28/2022  DOB: [birth year]  GY:JEHUDJ, Malka So, MD  Stark Klein, MD   REFERRING PHYSICIAN: Stark Klein, MD  DIAGNOSIS:    ICD-10-CM   1. Malignant neoplasm of upper-outer quadrant of right breast in female, estrogen receptor positive ([place])  C50.411 Ambulatory Referral to [facility]   Z17.0        Cancer Staging  Malignant neoplasm of upper-outer quadrant of right breast in female, estrogen receptor positive ([place]) Staging form: Breast, AJCC 8th Edition - Clinical stage from 06/06/2022: Stage IA (cT1c, cN0, cM0, G2, ER+, PR+, HER2+) - Signed by Marlynn Perking, PA-C on 06/28/2022 Stage prefix: Initial diagnosis Histologic grading system: 3 grade system   Stage 1A Right Breast UOQ Invasive Ductal Carcinoma, ER+ / PR+ / Her2+, Grade 2  CHIEF COMPLAINT: Here to discuss management of right breast cancer  HISTORY OF PRESENT ILLNESS::Dawn Bates is a 52 y.o. female who presented with a palpable lump in the right breast prompting a bilateral diagnostic mammogram and ultrasound on 05/26/22 which demonstrated a suspicious mass at the palpable site of concern in the 10 o'clock right breast, 6 cmfn, measuring 1.2 cm x 1.1 x 1.2 cm. No right axillary lymphadenopathy was appreciated.   Biopsy of the 10 o'clock right breast on date of 06/06/22 showed grade 2 invasive ductal carcinoma measuring 1.1 cm in the greatest linear extent of the sample.  ER status: 100% positive and PR status 5% positive, both with strong staining intensity; Proliferation marker Ki67 at 50%; Her2 status positive; Grade 2.  Accordingly, the patient was referred to Dr. Barry Dienes on 06/21/22 to discuss surgical treatment options. Following discussion with Dr. Barry Dienes, the patient has opted to proceed with breast conserving surgery and SLN biopsies. Her  procedure has been scheduled for 07/05/22.   In the setting of her Her2 positive disease, Dr. Barry Dienes would also like her to have her port placed at the time of breast conserving surgery.  The patient met with Dr. Chryl Heck yesterday. They discussed 8-12 weeks of chemotherapy following surgery and antiestrogen therapy.  In addition to the patient being diagnosed with breast cancer at a young age, she has a family history significant for breast cancer in her sister diagnosed in her late 20's, thyroid cancer in sister, and breast cancer in her maternal aunt. Based on her strong family history and young age, the patient was eligible to pursue urgent genetic testing on 06/21/21. Results are pending at this time.   Today she is present with her supportive husband. She has some breast discomfort from her biopsy on 06/06/22, but denies any breast pain or swelling, or arm swelling.   She does have a history of a radioactive ablation of her thyroid in 2022. She states that she has had a sensation of "something in my throat" for the past year. She denies any dysphagia or odynophagia. She sometimes chokes on her spit. She previously had a swallowing study that came back unremarkable.  PREVIOUS RADIATION THERAPY: Yes- Radioactive ablation of thyroid for hyperthyroidism in February 2022 with Dr. Loanne Drilling.   PAST MEDICAL HISTORY:  has a past medical history of Abnormal uterine bleeding (2022), Anemia (02/12/2021), Cancer ([facility]), COVID-19 (2020), Endometriosis, Fibroids, Graves disease, Hypertension, Hyperthyroidism (2022), Seasonal allergies, Vitamin D deficiency (2021), and Wears glasses.    PAST SURGICAL HISTORY: Past Surgical History:  Procedure Laterality Date  BREAST BIOPSY Right 06/06/2022   [place] RT BREAST BX W LOC DEV 1ST LESION IMG BX SPEC US GUIDE 06/06/2022 [facility]   CYSTOSCOPY  10/04/2021   Procedure: CYSTOSCOPY;  Surgeon: Crawford Givens, MD;  Location: [facility];  Service:  Gynecology;;   LAPAROSCOPIC VAGINAL HYSTERECTOMY WITH SALPINGECTOMY N/A 10/04/2021   Procedure: LAPAROSCOPIC ASSISTED VAGINAL HYSTERECTOMY WITH SALPINGECTOMY;  Surgeon: Crawford Givens, MD;  Location: [facility];  Service: Gynecology;  Laterality: N/A;   TUBAL LIGATION  09/2008    FAMILY HISTORY: family history includes Breast cancer (age of onset: 27) in her sister; Cancer in her paternal aunt; Thyroid cancer in her half-sister.  SOCIAL HISTORY:  reports that she has never smoked. She has never used smokeless tobacco. She reports that she does not drink alcohol and does not use drugs.  ALLERGIES: Latex  MEDICATIONS:  Current Outpatient Medications  Medication Sig Dispense Refill   amLODipine (NORVASC) 5 MG tablet TAKE 1 TABLET (5 MG TOTAL) BY MOUTH DAILY. 90 tablet 1   valACYclovir (VALTREX) 500 MG tablet Take 500 mg by mouth as needed.     Vitamin D, Ergocalciferol, (DRISDOL) 1.25 MG (50000 UNIT) CAPS capsule Take 1 capsule (50,000 Units total) by mouth every 7 (seven) days. 12 capsule 0   oxyCODONE-acetaminophen (PERCOCET/ROXICET) 5-325 MG tablet Take 1 tablet by mouth every 6 (six) hours as needed for moderate pain. (Patient not taking: Reported on 06/28/2022) 30 tablet 0   promethazine (PHENERGAN) 12.5 MG tablet Take 1 tablet (12.5 mg total) by mouth every 6 (six) hours as needed for nausea or vomiting. 30 tablet 0   No current facility-administered medications for this encounter.    REVIEW OF SYSTEMS: As above in HPI.   PHYSICAL EXAM:  height is '5\' 6"'$  (1.676 m) and weight is 139 lb 2 oz (63.1 kg). Her oral temperature is 96.3 F (35.7 C) (abnormal). Her blood pressure is 116/87 and her pulse is 71. Her respiration is 18 and oxygen saturation is 100%.   General: Alert and oriented, in no acute distress HEENT: Head is normocephalic. Extraocular movements are intact. Oropharynx is clear. Neck: Neck is supple, no palpable cervical or supraclavicular  lymphadenopathy. Heart: Regular in rate and rhythm with no murmurs, rubs, or gallops. Chest: Clear to auscultation bilaterally, with no rhonchi, wheezes, or rales. Abdomen: Soft, nontender, nondistended, with no rigidity or guarding. Extremities: No cyanosis or edema. Lymphatics: see Neck Exam Skin: No concerning lesions. Musculoskeletal: symmetric strength and muscle tone throughout. Neurologic: Cranial nerves II through XII are grossly intact. No obvious focalities. Speech is fluent. Coordination is intact. Psychiatric: Judgment and insight are intact. Affect is appropriate. Breasts: Small, moveable lump palpated in the upper outer quadrant of the right breast. Approximately 1 cm in size. Well healing biopsy incision site visualized on right breast. No other palpable masses appreciated in the breasts or axillae.  ECOG = 0  0 - Asymptomatic (Fully active, able to carry on all predisease activities without restriction)  1 - Symptomatic but completely ambulatory (Restricted in physically strenuous activity but ambulatory and able to carry out work of a light or sedentary nature. For example, light housework, office work)  2 - Symptomatic, <50% in bed during the day (Ambulatory and capable of all self care but unable to carry out any work activities. Up and about more than 50% of waking hours)  3 - Symptomatic, >50% in bed, but not bedbound (Capable of only limited self-care, confined to bed or chair 50%  or more of waking hours)  4 - Bedbound (Completely disabled. Cannot carry on any self-care. Totally confined to bed or chair)  5 - Death   Eustace Pen MM, Creech RH, Tormey DC, et al. [phone]). "Toxicity and response criteria of the [facility]". [place] Oncol. 5 (6): 649-55   LABORATORY DATA:  Lab Results  Component Value Date   WBC 9.5 10/05/2021   HGB 8.6 (L) 10/05/2021   HCT 28.5 (L) 10/05/2021   MCV 77.0 (L) 10/05/2021   PLT 177 10/05/2021   CMP      Component Value Date/Time   NA 134 (L) 10/05/2021 0046   K 4.1 10/05/2021 0046   CL 104 10/05/2021 0046   CO2 24 10/05/2021 0046   GLUCOSE 130 (H) 10/05/2021 0046   BUN 13 10/05/2021 0046   CREATININE 0.80 10/05/2021 0046   CREATININE 0.78 04/23/2021 1505   CALCIUM 9.0 10/05/2021 0046   PROT 7.8 02/12/2021 1126   AST 18 02/12/2021 1126   ALT 13 02/12/2021 1126   BILITOT 0.4 02/12/2021 1126   GFRNONAA >60 10/05/2021 0046   GFRNONAA 115 05/21/2020 1708   GFRAA 133 05/21/2020 1708         RADIOGRAPHY: [place] RT BREAST BX W LOC DEV 1ST LESION IMG BX SPEC US GUIDE  Addendum Date: 06/07/2022   ADDENDUM REPORT: 06/07/2022 14:47 ADDENDUM: Pathology revealed GRADE 2 INVASIVE DUCTAL CARCINOMA, OTHER FINDINGS: NUMEROUS INFILTRATING LYMPHOCYTES of the RIGHT breast, 10 o'clock, 6 cmfn (ribbon clip). This was found to be concordant by Dr. Kristopher Oppenheim. Pathology results were discussed with the patient by telephone. The patient reported doing well after the biopsy with tenderness at the site. Post biopsy instructions and care were reviewed and questions were answered. The patient was encouraged to call [facility] for any additional concerns. Per patient request, surgical consultation has been arranged with Dr. Stark Klein at [facility] on June 21, 2022. Pathology results reported by Stacie Acres RN on 06/07/2022. Electronically Signed   By: Kristopher Oppenheim M.D.   On: 06/07/2022 14:47   Result Date: 06/07/2022 CLINICAL DATA:  52 year old female with a suspicious right breast mass. EXAM: ULTRASOUND GUIDED RIGHT BREAST CORE NEEDLE BIOPSY COMPARISON:  Previous exam(s). PROCEDURE: I met with the patient and we discussed the procedure of ultrasound-guided biopsy, including benefits and alternatives. We discussed the high likelihood of a successful procedure. We discussed the risks of the procedure, including infection, bleeding, tissue injury, clip migration, and  inadequate sampling. Informed written consent was given. The usual time-out protocol was performed immediately prior to the procedure. Lesion quadrant: Upper outer quadrant Using sterile technique and 1% Lidocaine as local anesthetic, under direct ultrasound visualization, a 14 gauge spring-loaded device was used to perform biopsy of a mass at the 10 o'clock position of the right breast using a inferolateral approach. At the conclusion of the procedure a ribbon shaped tissue marker clip was deployed into the biopsy cavity. Follow up 2 view mammogram was performed and dictated separately. IMPRESSION: Ultrasound guided biopsy of the right breast. No apparent complications. Electronically Signed: By: Kristopher Oppenheim M.D. On: 06/06/2022 13:28  DG ESOPHAGUS W DOUBLE CM (HD)  Result Date: 06/07/2022 CLINICAL DATA:  Esophageal dysphagia. EXAM: ESOPHOGRAM / BARIUM SWALLOW / BARIUM TABLET STUDY TECHNIQUE: Combined double contrast and single contrast examination performed using effervescent crystals, thick barium liquid, and thin barium liquid. The patient was observed with fluoroscopy swallowing a 13 mm barium sulphate tablet. FLUOROSCOPY:  Radiation Exposure Index (as provided by the fluoroscopic device): 4.4 mGy COMPARISON:  None Available. FINDINGS: Initial barium swallows demonstrate normal pharyngeal motion with swallowing. No laryngeal penetration or aspiration. No upper esophageal webs, strictures or diverticuli. Normal esophageal motility. Normal mucosal folds. No intrinsic or extrinsic lesions are identified. No hiatal hernia or GE reflux demonstrated. The 13 mm barium pill passed into the stomach without difficulty. IMPRESSION: 1. Normal esophageal motility. 2. No hiatal hernia or GE reflux demonstrated. 3. No esophageal mass or stricture. Electronically Signed   By: Marijo Sanes M.D.   On: 06/07/2022 12:00   MM CLIP PLACEMENT RIGHT  Result Date: 06/06/2022 CLINICAL DATA:  Status post right breast biopsy.  EXAM: 3D DIAGNOSTIC RIGHT MAMMOGRAM POST ULTRASOUND BIOPSY COMPARISON:  Previous exam(s). FINDINGS: 3D Mammographic images were obtained following ultrasound guided biopsy of the upper outer right breast. The biopsy marking clip is in expected position at the site of biopsy. IMPRESSION: Appropriate positioning of the ribbon shaped biopsy marking clip at the site of biopsy in the upper outer right breast. Final Assessment: Post Procedure Mammograms for Marker Placement Electronically Signed   By: Kristopher Oppenheim M.D.   On: 06/06/2022 13:47     IMPRESSION/PLAN: Stage 1A Right Breast UOQ Invasive Ductal Carcinoma, ER+ / PR+ / Her2+, Grade 2   It was a pleasure meeting the patient today. We discussed the risks, benefits, and side effects of radiotherapy. I recommend radiotherapy to the right breast to reduce her risk of locoregional recurrence by 2/3.  We discussed that radiation would take approximately 4-6 weeks to complete and that I would give the patient a few weeks to heal following surgery before starting treatment planning. Chemotherapy will precede radiotherapy. We spoke about acute effects including skin irritation and fatigue as well as much less common late effects including internal organ injury or irritation. We spoke about the latest technology that is used to minimize the risk of late effects for patients undergoing radiotherapy to the breast or chest wall. No guarantees of treatment were given. The patient is enthusiastic about proceeding with treatment. I look forward to participating in the patient's care.  I will await her referral back to me for postoperative follow-up and eventual CT simulation/treatment planning.  Patient has experienced some weight loss recently so we discussed importance of a nutritious diet during her treatment. We discussed the Mediterranean diet as a good template to use when meal planning as well as plenty of protein intake. We also discussed avoidance of excessive  alcohol or soy intake due to her estrogen receptor positive cancer. Referral to nutrition made today.   Patient is also experiencing some difficulties in her neck following the radioablation of her thyroid. Her presenting symptoms are not critical at this time, but we do recommend follow up with her endocrinologist, Dr. Loanne Drilling, for discussion.  On date of service, in total, I spent 60 minutes on this encounter. Patient was seen in person.   __________________________________________   Leona Singleton, PA   Eppie Gibson, MD  This document serves as a record of services personally performed by Eppie Gibson, MD. It was created on her behalf by Roney Mans, a trained medical scribe. The creation of this record is based on the scribe's personal observations and the provider's statements to them. This document has been checked and approved by the attending provider.

## 2022-06-27 NOTE — Telephone Encounter (Addendum)
Per Merleen Nicely from Dr. Marlowe Aschoff office, they completed order and CancerNExt-Expanded was negative.  They communicated negative results to patient.  Blood collection cancelled.

## 2022-06-28 ENCOUNTER — Ambulatory Visit
Admission: RE | Admit: 2022-06-28 | Discharge: 2022-06-28 | Disposition: A | Discharge status: Home / Self Care | Payer: BC Managed Care – PPO | Source: Ambulatory Visit | Attending: Radiation Oncology | Admitting: Radiation Oncology

## 2022-06-28 ENCOUNTER — Other Ambulatory Visit: Payer: Self-pay

## 2022-06-28 ENCOUNTER — Encounter: Payer: Self-pay | Admitting: Hematology and Oncology

## 2022-06-28 ENCOUNTER — Encounter (HOSPITAL_BASED_OUTPATIENT_CLINIC_OR_DEPARTMENT_OTHER): Payer: Self-pay | Admitting: General Surgery

## 2022-06-28 ENCOUNTER — Encounter: Payer: Self-pay | Admitting: Radiation Oncology

## 2022-06-28 ENCOUNTER — Inpatient Hospital Stay: Payer: BC Managed Care – PPO

## 2022-06-28 VITALS — BP 116/87 | HR 71 | Temp 96.3°F | Resp 18 | Ht 66.0 in | Wt 139.1 lb

## 2022-06-28 DIAGNOSIS — Z803 Family history of malignant neoplasm of breast: Secondary | ICD-10-CM | POA: Insufficient documentation

## 2022-06-28 DIAGNOSIS — R0989 Other specified symptoms and signs involving the circulatory and respiratory systems: Secondary | ICD-10-CM | POA: Insufficient documentation

## 2022-06-28 DIAGNOSIS — N809 Endometriosis, unspecified: Secondary | ICD-10-CM | POA: Diagnosis not present

## 2022-06-28 DIAGNOSIS — C50411 Malignant neoplasm of upper-outer quadrant of right female breast: Secondary | ICD-10-CM | POA: Insufficient documentation

## 2022-06-28 DIAGNOSIS — Z170 Estrogen receptor positive status [ER+]: Secondary | ICD-10-CM | POA: Insufficient documentation

## 2022-06-28 DIAGNOSIS — R1314 Dysphagia, pharyngoesophageal phase: Secondary | ICD-10-CM | POA: Insufficient documentation

## 2022-06-28 DIAGNOSIS — I10 Essential (primary) hypertension: Secondary | ICD-10-CM | POA: Diagnosis not present

## 2022-06-28 DIAGNOSIS — E559 Vitamin D deficiency, unspecified: Secondary | ICD-10-CM | POA: Insufficient documentation

## 2022-06-28 DIAGNOSIS — Z79899 Other long term (current) drug therapy: Secondary | ICD-10-CM | POA: Insufficient documentation

## 2022-06-28 DIAGNOSIS — E0590 Thyrotoxicosis, unspecified without thyrotoxic crisis or storm: Secondary | ICD-10-CM | POA: Diagnosis not present

## 2022-06-28 NOTE — Assessment & Plan Note (Signed)
This is a very pleasant 52 year old premenopausal female patient with newly diagnosed right breast invasive ductal carcinoma T1 N0 M0 ER/PR and HER2 amplified referred to medical oncology for recommendations.  She arrived today to the appointment with her husband.  Given triple positive disease, we have discussed about considering adjuvant chemotherapy.  We have discussed about effects of neoadjuvant chemotherapy which may lead to complete pathological response which may shed light on prognostic information as well as also help tailor therapy if she did not achieve a complete pathological response.  She is not interested in neoadjuvant chemotherapy.  She however was clearly informed that adjuvant chemotherapy will be of benefit.  We have briefly discussed about 2 possible regimens which is trastuzumab with paclitaxel versus TCH which is carboplatin/docetaxel with trastuzumab.  Since she is young and without any major medical comorbidities TCH will be my preferred adjuvant regimen.  We have discussed that [place] could be considered in patients with low risk disease as well.  We have briefly discussed about adverse effects of chemotherapy including but not limited to fatigue, nausea, vomiting, diarrhea, increased risk of infections, neuropathy, cardiotoxicity.  We have discussed that some of the side effects can be permanent.  She had several good questions and all her questions were answered to the best my knowledge.  She had some questions regarding postponing surgery and genetic testing results.  Sent in basket message to genetics team who will coordinate genetic testing with her.  I encouraged her to call Dr. Marlowe Aschoff office to discuss postponement of surgery.  After adjuvant chemotherapy, depending on the type of surgery she chooses, she will proceed with radiation.  She had questions about role of mastectomy versus lumpectomy.  She understands that despite lumpectomy or mastectomy we may still recommend adjuvant  chemotherapy.  After completion of radiation, she will then proceed on with adjuvant antiestrogen therapy.  Thank you for consulting [place] in the care of this patient.  Please do not hesitate to contact us with any additional questions or concerns.

## 2022-06-29 ENCOUNTER — Telehealth: Payer: Self-pay | Admitting: *Deleted

## 2022-06-29 ENCOUNTER — Encounter: Payer: Self-pay | Admitting: *Deleted

## 2022-06-29 NOTE — Telephone Encounter (Signed)
Called pt to provide navigation resources and contact information. No answer, left detailed msg and request return call with questions or needs.

## 2022-06-29 NOTE — Telephone Encounter (Signed)
Pt contacted and detailed message left advising   Based on the previous investigation, the thyroid does not contain any worrisome nodules and is inconspicuous.  Also, it does not appear to compress the esophagus.  Therefore, her problems swallowing do not appear to be related to the thyroid.  Please have her discuss with PCP to see if she needs to be referred to ENT or GI for further investigation.

## 2022-07-04 MED ORDER — ENSURE PRE-SURGERY PO LIQD: 296.0000 mL | Freq: Once | ORAL | Status: DC

## 2022-07-04 NOTE — Progress Notes (Signed)

## 2022-07-05 ENCOUNTER — Ambulatory Visit (HOSPITAL_BASED_OUTPATIENT_CLINIC_OR_DEPARTMENT_OTHER): Payer: BC Managed Care – PPO | Admitting: Anesthesiology

## 2022-07-05 ENCOUNTER — Ambulatory Visit (HOSPITAL_BASED_OUTPATIENT_CLINIC_OR_DEPARTMENT_OTHER)
Admission: RE | Admit: 2022-07-05 | Discharge: 2022-07-05 | Disposition: A | Discharge status: Home / Self Care | Payer: BC Managed Care – PPO | Attending: General Surgery | Admitting: General Surgery

## 2022-07-05 ENCOUNTER — Other Ambulatory Visit: Payer: Self-pay

## 2022-07-05 ENCOUNTER — Encounter (HOSPITAL_BASED_OUTPATIENT_CLINIC_OR_DEPARTMENT_OTHER)
Admission: RE | Disposition: A | Discharge status: Home / Self Care | Payer: Self-pay | Source: Home / Self Care | Attending: General Surgery

## 2022-07-05 ENCOUNTER — Encounter (HOSPITAL_BASED_OUTPATIENT_CLINIC_OR_DEPARTMENT_OTHER): Payer: Self-pay | Admitting: General Surgery

## 2022-07-05 ENCOUNTER — Ambulatory Visit (HOSPITAL_COMMUNITY): Payer: BC Managed Care – PPO

## 2022-07-05 DIAGNOSIS — Z170 Estrogen receptor positive status [ER+]: Secondary | ICD-10-CM | POA: Diagnosis not present

## 2022-07-05 DIAGNOSIS — E0590 Thyrotoxicosis, unspecified without thyrotoxic crisis or storm: Secondary | ICD-10-CM | POA: Insufficient documentation

## 2022-07-05 DIAGNOSIS — C50411 Malignant neoplasm of upper-outer quadrant of right female breast: Secondary | ICD-10-CM | POA: Diagnosis present

## 2022-07-05 DIAGNOSIS — Z79899 Other long term (current) drug therapy: Secondary | ICD-10-CM | POA: Insufficient documentation

## 2022-07-05 DIAGNOSIS — I10 Essential (primary) hypertension: Secondary | ICD-10-CM | POA: Insufficient documentation

## 2022-07-05 DIAGNOSIS — Z803 Family history of malignant neoplasm of breast: Secondary | ICD-10-CM | POA: Diagnosis not present

## 2022-07-05 HISTORY — DX: Malignant (primary) neoplasm, unspecified: C80.1

## 2022-07-05 HISTORY — PX: BREAST LUMPECTOMY WITH SENTINEL LYMPH NODE BIOPSY: SHX5597

## 2022-07-05 HISTORY — PX: PORTACATH PLACEMENT: SHX2246

## 2022-07-05 SURGERY — BREAST LUMPECTOMY WITH SENTINEL LYMPH NODE BX
Anesthesia: Regional | Site: Chest | Laterality: Right

## 2022-07-05 MED ORDER — OXYCODONE HCL 5 MG PO TABS: 5.0000 mg | ORAL_TABLET | Freq: Four times a day (QID) | ORAL | 0 refills | Status: DC | PRN

## 2022-07-05 MED ORDER — GLYCOPYRROLATE PF 0.2 MG/ML IJ SOSY
PREFILLED_SYRINGE | INTRAMUSCULAR | Status: AC
  Filled 2022-07-05: qty 1

## 2022-07-05 MED ORDER — ONDANSETRON HCL 4 MG/2ML IJ SOLN
INTRAMUSCULAR | Status: AC
  Filled 2022-07-05: qty 2

## 2022-07-05 MED ORDER — ONDANSETRON HCL 4 MG/2ML IJ SOLN
INTRAMUSCULAR | Status: DC | PRN
  Administered 2022-07-05: 4 mg via INTRAVENOUS

## 2022-07-05 MED ORDER — LIDOCAINE-EPINEPHRINE (PF) 1 %-1:200000 IJ SOLN
INTRAMUSCULAR | Status: AC
  Filled 2022-07-05: qty 60

## 2022-07-05 MED ORDER — KETOROLAC TROMETHAMINE 30 MG/ML IJ SOLN: 30.0000 mg | Freq: Once | INTRAMUSCULAR | Status: DC | PRN

## 2022-07-05 MED ORDER — PROMETHAZINE HCL 25 MG/ML IJ SOLN: 6.2500 mg | INTRAMUSCULAR | Status: DC | PRN

## 2022-07-05 MED ORDER — LIDOCAINE-EPINEPHRINE (PF) 1 %-1:200000 IJ SOLN
INTRAMUSCULAR | Status: DC | PRN
  Administered 2022-07-05: 16 mL via INTRAMUSCULAR

## 2022-07-05 MED ORDER — CHLORHEXIDINE GLUCONATE CLOTH 2 % EX PADS: 6.0000 | MEDICATED_PAD | Freq: Once | CUTANEOUS | Status: DC

## 2022-07-05 MED ORDER — CEFAZOLIN SODIUM-DEXTROSE 2-4 GM/100ML-% IV SOLN
2.0000 g | INTRAVENOUS | Status: AC
  Administered 2022-07-05: 2 g via INTRAVENOUS

## 2022-07-05 MED ORDER — BUPIVACAINE-EPINEPHRINE (PF) 0.5% -1:200000 IJ SOLN
INTRAMUSCULAR | Status: DC | PRN
  Administered 2022-07-05: 30 mL via PERINEURAL

## 2022-07-05 MED ORDER — AMISULPRIDE (ANTIEMETIC) 5 MG/2ML IV SOLN: 10.0000 mg | Freq: Once | INTRAVENOUS | Status: DC | PRN

## 2022-07-05 MED ORDER — LIDOCAINE HCL (CARDIAC) PF 100 MG/5ML IV SOSY
PREFILLED_SYRINGE | INTRAVENOUS | Status: DC | PRN
  Administered 2022-07-05: 60 mg via INTRAVENOUS

## 2022-07-05 MED ORDER — LIDOCAINE HCL (PF) 1 % IJ SOLN
INTRAMUSCULAR | Status: AC
  Filled 2022-07-05: qty 30

## 2022-07-05 MED ORDER — ACETAMINOPHEN 500 MG PO TABS: 1000.0000 mg | ORAL_TABLET | ORAL | Status: DC

## 2022-07-05 MED ORDER — HEPARIN SOD (PORK) LOCK FLUSH 100 UNIT/ML IV SOLN
INTRAVENOUS | Status: DC | PRN
  Administered 2022-07-05: 500 [IU]

## 2022-07-05 MED ORDER — GLYCOPYRROLATE 0.2 MG/ML IJ SOLN
INTRAMUSCULAR | Status: DC | PRN
  Administered 2022-07-05: .2 mg via INTRAVENOUS

## 2022-07-05 MED ORDER — KETOROLAC TROMETHAMINE 30 MG/ML IJ SOLN
INTRAMUSCULAR | Status: DC | PRN
  Administered 2022-07-05: 30 mg via INTRAVENOUS

## 2022-07-05 MED ORDER — LACTATED RINGERS IV SOLN: INTRAVENOUS | Status: DC

## 2022-07-05 MED ORDER — FENTANYL CITRATE (PF) 100 MCG/2ML IJ SOLN
INTRAMUSCULAR | Status: AC
  Filled 2022-07-05: qty 2

## 2022-07-05 MED ORDER — HEPARIN SODIUM (PORCINE) 1000 UNIT/ML IJ SOLN
INTRAMUSCULAR | Status: AC
  Filled 2022-07-05: qty 1

## 2022-07-05 MED ORDER — BUPIVACAINE HCL (PF) 0.25 % IJ SOLN
INTRAMUSCULAR | Status: AC
  Filled 2022-07-05: qty 90

## 2022-07-05 MED ORDER — MIDAZOLAM HCL 2 MG/2ML IJ SOLN
INTRAMUSCULAR | Status: AC
  Filled 2022-07-05: qty 2

## 2022-07-05 MED ORDER — ACETAMINOPHEN 500 MG PO TABS
1000.0000 mg | ORAL_TABLET | Freq: Once | ORAL | Status: AC
  Administered 2022-07-05: 1000 mg via ORAL

## 2022-07-05 MED ORDER — KETOROLAC TROMETHAMINE 30 MG/ML IJ SOLN
INTRAMUSCULAR | Status: AC
  Filled 2022-07-05: qty 1

## 2022-07-05 MED ORDER — HEPARIN (PORCINE) IN NACL 2-0.9 UNITS/ML
INTRAMUSCULAR | Status: AC | PRN
  Administered 2022-07-05: 1 via INTRAVENOUS

## 2022-07-05 MED ORDER — MIDAZOLAM HCL 2 MG/2ML IJ SOLN
2.0000 mg | Freq: Once | INTRAMUSCULAR | Status: AC
  Administered 2022-07-05: 2 mg via INTRAVENOUS

## 2022-07-05 MED ORDER — OXYCODONE HCL 5 MG PO TABS
5.0000 mg | ORAL_TABLET | Freq: Once | ORAL | Status: AC | PRN
  Administered 2022-07-05: 5 mg via ORAL

## 2022-07-05 MED ORDER — PROPOFOL 10 MG/ML IV BOLUS
INTRAVENOUS | Status: AC
  Filled 2022-07-05: qty 20

## 2022-07-05 MED ORDER — LIDOCAINE 2% (20 MG/ML) 5 ML SYRINGE
INTRAMUSCULAR | Status: AC
  Filled 2022-07-05: qty 5

## 2022-07-05 MED ORDER — OXYCODONE HCL 5 MG/5ML PO SOLN: 5.0000 mg | Freq: Once | ORAL | Status: AC | PRN

## 2022-07-05 MED ORDER — OXYCODONE HCL 5 MG PO TABS
ORAL_TABLET | ORAL | Status: AC
  Filled 2022-07-05: qty 1

## 2022-07-05 MED ORDER — FENTANYL CITRATE (PF) 100 MCG/2ML IJ SOLN
100.0000 ug | Freq: Once | INTRAMUSCULAR | Status: AC
  Administered 2022-07-05: 50 ug via INTRAVENOUS

## 2022-07-05 MED ORDER — FENTANYL CITRATE (PF) 100 MCG/2ML IJ SOLN: 25.0000 ug | INTRAMUSCULAR | Status: DC | PRN

## 2022-07-05 MED ORDER — CEFAZOLIN SODIUM-DEXTROSE 2-4 GM/100ML-% IV SOLN
INTRAVENOUS | Status: AC
  Filled 2022-07-05: qty 100

## 2022-07-05 MED ORDER — HEPARIN SOD (PORK) LOCK FLUSH 100 UNIT/ML IV SOLN
INTRAVENOUS | Status: AC
  Filled 2022-07-05: qty 5

## 2022-07-05 MED ORDER — DEXAMETHASONE SODIUM PHOSPHATE 10 MG/ML IJ SOLN
INTRAMUSCULAR | Status: AC
  Filled 2022-07-05: qty 1

## 2022-07-05 MED ORDER — PHENYLEPHRINE HCL (PRESSORS) 10 MG/ML IV SOLN
INTRAVENOUS | Status: DC | PRN
  Administered 2022-07-05 (×4): 80 ug via INTRAVENOUS

## 2022-07-05 MED ORDER — ACETAMINOPHEN 500 MG PO TABS
ORAL_TABLET | ORAL | Status: AC
  Filled 2022-07-05: qty 2

## 2022-07-05 MED ORDER — BUPIVACAINE HCL (PF) 0.5 % IJ SOLN
INTRAMUSCULAR | Status: AC
  Filled 2022-07-05: qty 30

## 2022-07-05 MED ORDER — FENTANYL CITRATE (PF) 100 MCG/2ML IJ SOLN
INTRAMUSCULAR | Status: DC | PRN
  Administered 2022-07-05: 25 ug via INTRAVENOUS
  Administered 2022-07-05: 50 ug via INTRAVENOUS
  Administered 2022-07-05: 25 ug via INTRAVENOUS

## 2022-07-05 MED ORDER — PROPOFOL 10 MG/ML IV BOLUS
INTRAVENOUS | Status: DC | PRN
  Administered 2022-07-05: 200 mg via INTRAVENOUS

## 2022-07-05 MED ORDER — MAGTRACE LYMPHATIC TRACER
INTRAMUSCULAR | Status: DC | PRN
  Administered 2022-07-05: 2 mL via INTRAMUSCULAR

## 2022-07-05 MED ORDER — HEPARIN (PORCINE) IN NACL 1000-0.9 UT/500ML-% IV SOLN
INTRAVENOUS | Status: AC
  Filled 2022-07-05: qty 500

## 2022-07-05 MED ORDER — DEXAMETHASONE SODIUM PHOSPHATE 4 MG/ML IJ SOLN
INTRAMUSCULAR | Status: DC | PRN
  Administered 2022-07-05: 5 mg via INTRAVENOUS

## 2022-07-05 SURGICAL SUPPLY — 64 items
ADH SKN CLS APL DERMABOND .7 (GAUZE/BANDAGES/DRESSINGS) ×2
APL PRP STRL LF DISP 70% ISPRP (MISCELLANEOUS) ×2
BAG DECANTER FOR FLEXI CONT (MISCELLANEOUS) ×2 IMPLANT
BINDER BREAST LRG (GAUZE/BANDAGES/DRESSINGS) IMPLANT
BINDER BREAST MEDIUM (GAUZE/BANDAGES/DRESSINGS) IMPLANT
BLADE SURG 10 STRL SS (BLADE) ×2 IMPLANT
BLADE SURG 11 STRL SS (BLADE) ×2 IMPLANT
BLADE SURG 15 STRL LF DISP TIS (BLADE) ×2 IMPLANT
BLADE SURG 15 STRL SS (BLADE) ×2
BNDG GAUZE DERMACEA FLUFF 4 (GAUZE/BANDAGES/DRESSINGS) ×2 IMPLANT
BNDG GZE DERMACEA 4 6PLY (GAUZE/BANDAGES/DRESSINGS) ×2
CANISTER SUCT 1200ML W/VALVE (MISCELLANEOUS) ×2 IMPLANT
CHLORAPREP W/TINT 26 (MISCELLANEOUS) ×2 IMPLANT
CLIP TI LARGE 6 (CLIP) ×2 IMPLANT
CLIP TI MEDIUM 6 (CLIP) ×2 IMPLANT
COVER MAYO STAND STRL (DRAPES) ×2 IMPLANT
COVER PROBE CYLINDRICAL 5X96 (MISCELLANEOUS) ×2 IMPLANT
DERMABOND ADVANCED .7 DNX12 (GAUZE/BANDAGES/DRESSINGS) ×2 IMPLANT
DRAPE C-ARM 42X72 X-RAY (DRAPES) ×2 IMPLANT
DRAPE UTILITY XL STRL (DRAPES) ×2 IMPLANT
ELECT COATED BLADE 2.86 ST (ELECTRODE) ×2 IMPLANT
ELECT REM PT RETURN 9FT ADLT (ELECTROSURGICAL) ×2
ELECTRODE REM PT RTRN 9FT ADLT (ELECTROSURGICAL) ×2 IMPLANT
GAUZE PAD ABD 8X10 STRL (GAUZE/BANDAGES/DRESSINGS) ×2 IMPLANT
GAUZE SPONGE 4X4 12PLY STRL (GAUZE/BANDAGES/DRESSINGS) ×4 IMPLANT
GLOVE BIOGEL PI IND STRL 6.5 (GLOVE) ×2 IMPLANT
GLOVE SURG SS PI 6.0 STRL IVOR (GLOVE) IMPLANT
GLOVE SURG SS PI 6.5 STRL IVOR (GLOVE) IMPLANT
GLOVE SURG SS PI 7.0 STRL IVOR (GLOVE) IMPLANT
GOWN STRL REUS W/ TWL LRG LVL3 (GOWN DISPOSABLE) ×2 IMPLANT
GOWN STRL REUS W/TWL 2XL LVL3 (GOWN DISPOSABLE) ×2 IMPLANT
GOWN STRL REUS W/TWL LRG LVL3 (GOWN DISPOSABLE) ×4
KIT MARKER MARGIN INK (KITS) IMPLANT
KIT PORT POWER 8FR ISP CVUE (Port) IMPLANT
LIGHT WAVEGUIDE WIDE FLAT (MISCELLANEOUS) IMPLANT
NDL HYPO 25X1 1.5 SAFETY (NEEDLE) ×4 IMPLANT
NDL SAFETY ECLIP 18X1.5 (MISCELLANEOUS) ×2 IMPLANT
NEEDLE HYPO 25X1 1.5 SAFETY (NEEDLE) ×4 IMPLANT
NS IRRIG 1000ML POUR BTL (IV SOLUTION) ×2 IMPLANT
PACK BASIN DAY SURGERY FS (CUSTOM PROCEDURE TRAY) ×2 IMPLANT
PACK UNIVERSAL I (CUSTOM PROCEDURE TRAY) ×2 IMPLANT
PENCIL SMOKE EVACUATOR (MISCELLANEOUS) ×2 IMPLANT
SLEEVE SCD COMPRESS KNEE MED (STOCKING) ×2 IMPLANT
SPONGE T-LAP 18X18 ~~LOC~~+RFID (SPONGE) ×2 IMPLANT
STOCKINETTE IMPERVIOUS LG (DRAPES) ×2 IMPLANT
STRIP CLOSURE SKIN 1/2X4 (GAUZE/BANDAGES/DRESSINGS) ×2 IMPLANT
SUT MNCRL AB 4-0 PS2 18 (SUTURE) ×2 IMPLANT
SUT MON AB 4-0 PC3 18 (SUTURE) ×2 IMPLANT
SUT PROLENE 2 0 SH DA (SUTURE) ×4 IMPLANT
SUT SILK 2 0 SH (SUTURE) IMPLANT
SUT VIC AB 2-0 SH 27 (SUTURE) ×2
SUT VIC AB 2-0 SH 27XBRD (SUTURE) IMPLANT
SUT VIC AB 3-0 SH 27 (SUTURE)
SUT VIC AB 3-0 SH 27X BRD (SUTURE) ×4 IMPLANT
SUT VICRYL 3-0 CR8 SH (SUTURE) IMPLANT
SYR 10ML LL (SYRINGE) ×2 IMPLANT
SYR 5ML LUER SLIP (SYRINGE) ×2 IMPLANT
SYR BULB EAR ULCER 3OZ GRN STR (SYRINGE) IMPLANT
SYR CONTROL 10ML LL (SYRINGE) ×4 IMPLANT
TOWEL GREEN STERILE FF (TOWEL DISPOSABLE) ×2 IMPLANT
TRACER MAGTRACE VIAL (MISCELLANEOUS) IMPLANT
TRAY FAXITRON CT DISP (TRAY / TRAY PROCEDURE) IMPLANT
TUBE CONNECTING 20X1/4 (TUBING) ×2 IMPLANT
YANKAUER SUCT BULB TIP NO VENT (SUCTIONS) ×2 IMPLANT

## 2022-07-05 NOTE — Anesthesia Procedure Notes (Signed)
Procedure Name: LMA Insertion Date/Time: 07/05/2022 9:50 AM  Performed by: Bufford Spikes, CRNAPre-anesthesia Checklist: Patient identified, Emergency Drugs available, Suction available and Patient being monitored Patient Re-evaluated:Patient Re-evaluated prior to induction Oxygen Delivery Method: Circle system utilized Preoxygenation: Pre-oxygenation with 100% oxygen Induction Type: IV induction Ventilation: Mask ventilation without difficulty LMA: LMA inserted LMA Size: 4.0 Number of attempts: 1 Placement Confirmation: positive ETCO2 Tube secured with: Tape Dental Injury: Teeth and Oropharynx as per pre-operative assessment

## 2022-07-05 NOTE — Op Note (Signed)
Right Breast Lumpectomy with Sentinel Node Mapping and Biopsy, port placement  Indications: This patient presents with history of right breast cancer with clinically negative axillary lymph node exam.  Pre-operative Diagnosis: right breast cancer, upper outer quadrant, ER+/PR+/Her 2+, cT1cN0  Post-operative Diagnosis: right breast cancer  Surgeon: Stark Klein   Assistants: n/a  Anesthesia: General LMA anesthesia and Local anesthesia 1% plain lidocaine, 0.25.% bupivacaine, with epinephrine  ASA Class: 2  Procedure Details  The patient was seen in the Holding Room. The risks, benefits, complications, treatment options, and expected outcomes were discussed with the patient. The possibilities of reaction to medication, pulmonary aspiration, bleeding, infection, the need for additional procedures, failure to diagnose a condition, and creating a complication requiring transfusion or operation were discussed with the patient. The patient concurred with the proposed plan, giving informed consent.  The site of surgery properly noted/marked. The patient was taken to Operating Room # 8, identified as Dawn Bates and the procedure verified as right Breast Lumpectomy and Sentinel Node Biopsy, port placement. A Time Out was held and the above information confirmed.   After induction of anesthesia, the right arm, bilateral breast and chest were prepped and draped in standard fashion.  Magtrace was infiltrated into the right sided subareolar location.     Local anesthetic was administered over the area at the angle of the left clavicle.  The vein was accessed with 1 pass(es) of the needle. There was good venous return, but the wire would not pass.  The vein was accessed again easily with the same result.  A different angle was taken and was again accessed in 1 stick.  Then the wire passed easily with no ectopy.   Fluoroscopy was used to confirm that the wire was in the vena cava.      The patient  was placed back level and the area for the pocket was anethetized   with local anesthetic.  A 3-cm transverse incision was made with a #15   blade.  Cautery was used to divide the subcutaneous tissues down to the   pectoralis muscle.  An [employer] retractor was used to elevate the skin   while a pocket was created on top of the pectoralis fascia.  The port   was placed into the pocket to confirm that it was of adequate size.  The   catheter was preattached to the port.  The port was then secured to the   pectoralis fascia with four 2-0 Prolene sutures.  These were clamped and   not tied down yet.    The catheter was tunneled through to the wire exit   site.  The catheter was placed along the wire to determine what length it should be to be in the SVC.  The catheter was cut at 22.5 cm.  The tunneler sheath and dilator were passed over the wire and the dilator and wire were removed.  The catheter was advanced through the tunneler sheath and the tunneler sheath was pulled away.  Care was taken to keep the catheter in the tunneler sheath as this occurred. This was advanced and the tunneler sheath was removed.  There was good venous   return and easy flush of the catheter.  The Prolene sutures were tied   down to the pectoral fascia.  The skin was reapproximated using 3-0   Vicryl interrupted deep dermal sutures.    Fluoroscopy was used to re-confirm good position of the catheter.  The skin   was  then closed using 4-0 Monocryl in a subcuticular fashion.  The port was flushed with concentrated heparin flush as well.    The right sided lumpectomy was performed by creating an axillary incision near the mass.  Skin hooks were used to elevate the skin and the cautery was used to dissect around the mass.  Dissection was carried down to the pectoral fascia.  The specimen was marked with the margin marker paint kit.  The specimen was placed in the faxatron to confirm presence on the biopsy clip.  Hemostasis  was achieved with cautery.  Large clips were placed on the specimen cavity edges for radiation.   The wound was irrigated.    The same incision was used for the SLN bx.    Dissection was carried through the clavipectoral fascia.  Two deep level 2 axillary sentinel nodes were removed. Lymphovascular channels were clipped.  The wound was irrigated.  Hemostasis was achieved with cautery and clips.  The axillary incision was closed with 3-0 vicryl deep dermal interrupted sutures and 4-0 monocryl subcuticular closure in layers.      Sterile dressings were applied. At the end of the operation, all sponge, instrument, and needle counts were correct.  Findings: Good flush/easy venous return for left subclavian port.  Right lumpectomy with posterior margin pectoralis and anterior margin skin.  SLN #1 cps 8900, SLN #2 cps 2800, background 20  Estimated Blood Loss:  Minimal         Specimens: right breast lumpectomy, right axillary SLN #1, right axillary SLN #2                Complications:  None; patient tolerated the procedure well.         Disposition: PACU - hemodynamically stable.         Condition: stable

## 2022-07-05 NOTE — Transfer of Care (Signed)
Immediate Anesthesia Transfer of Care Note  Patient: Dawn Bates  Procedure(s) Performed: RIGHT BREAST LUMPECTOMY WITH SENTINEL LYMPH NODE BX (Right: Breast) INSERTION PORT-A-CATH (Left: Chest)  Patient Location: PACU  Anesthesia Type:General  Level of Consciousness: awake, alert , and oriented  Airway & Oxygen Therapy: Patient Spontanous Breathing and Patient connected to face mask oxygen  Post-op Assessment: Report given to RN and Post -op Vital signs reviewed and stable  Post vital signs: Reviewed and stable  Last Vitals:  Vitals Value Taken Time  BP 132/86 07/05/22 1150  Temp    Pulse 77 07/05/22 1151  Resp 12 07/05/22 1151  SpO2 100 % 07/05/22 1151  Vitals shown include unvalidated device data.  Last Pain:  Vitals:   07/05/22 0719  TempSrc: Oral  PainSc: 0-No pain      Patients Stated Pain Goal: 3 (23/55/73 2202)  Complications: No notable events documented.

## 2022-07-05 NOTE — Anesthesia Preprocedure Evaluation (Addendum)
Anesthesia Evaluation  Patient identified by MRN, date of birth, ID band Patient awake    Reviewed: Allergy & Precautions, NPO status , Patient's Chart, lab work & pertinent test results  Airway Mallampati: II  TM Distance: >3 FB Neck ROM: Full    Dental no notable dental hx.    Pulmonary neg pulmonary ROS   Pulmonary exam normal        Cardiovascular hypertension, Pt. on medications Normal cardiovascular exam     Neuro/Psych  Headaches  negative psych ROS   GI/Hepatic negative GI ROS, Neg liver ROS,,,  Endo/Other   Hyperthyroidism   Renal/GU negative Renal ROS     Musculoskeletal negative musculoskeletal ROS (+)    Abdominal   Peds  Hematology negative hematology ROS (+)   Anesthesia Other Findings RIGHT BREAST CANCER  Reproductive/Obstetrics S/p BTL and hysterectomy                             Anesthesia Physical Anesthesia Plan  ASA: 2  Anesthesia Plan: General and Regional   Post-op Pain Management: Regional block*   Induction: Intravenous  PONV Risk Score and Plan: 3 and Ondansetron, Dexamethasone, Midazolam and Treatment may vary due to age or medical condition  Airway Management Planned: LMA  Additional Equipment:   Intra-op Plan:   Post-operative Plan: Extubation in OR  Informed Consent: I have reviewed the patients History and Physical, chart, labs and discussed the procedure including the risks, benefits and alternatives for the proposed anesthesia with the patient or authorized representative who has indicated his/her understanding and acceptance.     Dental advisory given  Plan Discussed with: CRNA  Anesthesia Plan Comments:        Anesthesia Quick Evaluation

## 2022-07-05 NOTE — Progress Notes (Signed)
Assisted Dr. Roanna Banning with right, pectoralis, ultrasound guided block. Side rails up, monitors on throughout procedure. See vital signs in flow sheet. Tolerated Procedure well.

## 2022-07-05 NOTE — Discharge Instructions (Addendum)
[place] Office Phone Number [phone]  BREAST BIOPSY/ PARTIAL MASTECTOMY: POST OP INSTRUCTIONS  Always review your discharge instruction sheet given to you by the facility where your surgery was performed.  IF YOU HAVE DISABILITY OR FAMILY LEAVE FORMS, YOU MUST BRING THEM TO THE OFFICE FOR PROCESSING.  DO NOT GIVE THEM TO YOUR DOCTOR.  Take 2 tylenol (acetominophen) three times a day for 3 days.  If you still have pain, add ibuprofen with food in between if able to take this (if you have kidney issues or stomach issues, do not take ibuprofen).  If both of those are not enough, add the narcotic pain pill.  If you find you are needing a lot of this overnight after surgery, call the next morning for a refill.    Prescriptions will not be filled after 5pm or on week-ends. Take your usually prescribed medications unless otherwise directed You should eat very light the first 24 hours after surgery, such as soup, crackers, pudding, etc.  Resume your normal diet the day after surgery. Most patients will experience some swelling and bruising in the breast.  Ice packs and a good support bra will help.  Swelling and bruising can take several days to resolve.  It is common to experience some constipation if taking pain medication after surgery.  Increasing fluid intake and taking a stool softener will usually help or prevent this problem from occurring.  A mild laxative (Milk of Magnesia or Miralax) should be taken according to package directions if there are no bowel movements after 48 hours. Unless discharge instructions indicate otherwise, you may remove your bandages 48 hours after surgery, and you may shower at that time.  You may have steri-strips (small skin tapes) in place directly over the incision.  These strips should be left on the skin at least for for 7-10 days.    ACTIVITIES:  You may resume regular daily activities (gradually increasing) beginning the next day.  Wearing a  good support bra or sports bra (or the breast binder) minimizes pain and swelling.  You may have sexual intercourse when it is comfortable. No heavy lifting for 1-2 weeks (not over around 10 pounds).  You may drive when you no longer are taking prescription pain medication, you can comfortably wear a seatbelt, and you can safely maneuver your car and apply brakes. RETURN TO WORK:  __________3-14 days depending on job. _______________ Dennis Bast should see your doctor in the office for a follow-up appointment approximately two weeks after your surgery.  Your doctor's nurse will typically make your follow-up appointment when she calls you with your pathology report.  Expect your pathology report 3-4 business days after your surgery.  You may call to check if you do not hear from [place] after three days.   WHEN TO CALL YOUR DOCTOR: Fever over 101.0 Nausea and/or vomiting. Extreme swelling or bruising. Continued bleeding from incision. Increased pain, redness, or drainage from the incision.  The clinic staff is available to answer your questions during regular business hours.  Please don't hesitate to call and ask to speak to one of the nurses for clinical concerns.  If you have a medical emergency, go to the nearest emergency room or call 911.  A surgeon from [facility] is always on call at the hospital.  For further questions, please visit [email or web address]    Post Anesthesia Home Care Instructions  Activity: Get plenty of rest for the remainder of the day. A responsible individual must stay  with you for 24 hours following the procedure.  For the next 24 hours, DO NOT: -Drive a car -Paediatric nurse -Drink alcoholic beverages -Take any medication unless instructed by your physician -Make any legal decisions or sign important papers.  Meals: Start with liquid foods such as gelatin or soup. Progress to regular foods as tolerated. Avoid greasy, spicy, heavy foods. If nausea  and/or vomiting occur, drink only clear liquids until the nausea and/or vomiting subsides. Call your physician if vomiting continues.  Special Instructions/Symptoms: Your throat may feel dry or sore from the anesthesia or the breathing tube placed in your throat during surgery. If this causes discomfort, gargle with warm salt water. The discomfort should disappear within 24 hours.  If you had a scopolamine patch placed behind your ear for the management of post- operative nausea and/or vomiting:  1. The medication in the patch is effective for 72 hours, after which it should be removed.  Wrap patch in a tissue and discard in the trash. Wash hands thoroughly with soap and water. 2. You may remove the patch earlier than 72 hours if you experience unpleasant side effects which may include dry mouth, dizziness or visual disturbances. 3. Avoid touching the patch. Wash your hands with soap and water after contact with the patch.    *May have Tylenol today at 1:30pm *May have Ibuprofen today at 5:30pm

## 2022-07-05 NOTE — Anesthesia Postprocedure Evaluation (Signed)
Anesthesia Post Note  Patient: Escarlet A Hoon  Procedure(s) Performed: RIGHT BREAST LUMPECTOMY WITH SENTINEL LYMPH NODE BX (Right: Breast) INSERTION PORT-A-CATH (Left: Chest)     Patient location during evaluation: PACU Anesthesia Type: Regional and General Level of consciousness: awake and alert Pain management: pain level controlled Vital Signs Assessment: post-procedure vital signs reviewed and stable Respiratory status: spontaneous breathing, nonlabored ventilation and respiratory function stable Cardiovascular status: blood pressure returned to baseline and stable Postop Assessment: no apparent nausea or vomiting Anesthetic complications: no   No notable events documented.  Last Vitals:  Vitals:   07/05/22 1250 07/05/22 1310  BP:  (!) 148/84  Pulse: 73 63  Resp: 12 14  Temp:  37 C  SpO2: 99% 99%    Last Pain:  Vitals:   07/05/22 1310  TempSrc: Oral  PainSc: 4                  Lynda Rainwater

## 2022-07-05 NOTE — Interval H&P Note (Signed)
History and Physical Interval Note:  07/05/2022 9:26 AM  Dawn Bates  has presented today for surgery, with the diagnosis of RIGHT BREAST CANCER.  The various methods of treatment have been discussed with the patient and family. After consideration of risks, benefits and other options for treatment, the patient has consented to  Procedure(s): RIGHT BREAST LUMPECTOMY WITH SENTINEL LYMPH NODE BX (Right) POSSIBLE INSERTION PORT-A-CATH (N/A) as a surgical intervention.  The patient's history has been reviewed, patient examined, no change in status, stable for surgery.  I have reviewed the patient's chart and labs.  Questions were answered to the patient's satisfaction.     Stark Klein

## 2022-07-05 NOTE — H&P (Signed)
REFERRING PHYSICIAN: Ivin Poot  PROVIDER: Georgianne Fick, MD  Care Team: Patient Care Team: [place], [name] as PCP - General Bing Matter, DO (Obstetrics and Gynecology) Georgianne Fick, MD as Consulting Provider (Surgical Oncology)  MRN: [id number] DOB: [birth year] DATE OF ENCOUNTER: 06/21/2022  Subjective  Chief Complaint: Breast Cancer (Right)   History of Present Illness: Dawn Bates is a 52 y.o. female who is seen today as an office consultation at the request of Dr. Ivin Poot for evaluation of Breast Cancer (Right) .  Pt presents with a new diagnosis of right breast cancer 05/2022. She had a palpable mass in the right breast detected by spouse. Diagnostic imaging was performed. This showed a 1.2 cm mass at 10 o'clock. Core needle biopsy was performed. This was positive for grade 2 invasive ductal carcinoma that was triple positive, Ki 67 50%. She is here to discuss. She had negative mammogram 08/2019 and normal breast exam 09/2021.  Family cancer history: sister had breast cancer in late 28s. She also had thyroid cancer. Maternal aunt had breast cancer.  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  Diagnostic mammogram/us: 05/26/2022 ACR Breast Density Category b: There are scattered areas of fibroglandular density. FINDINGS: Mammogram: Right breast: A skin BB marks the palpable site of concern reported by the patient in the upper outer right breast. A spot tangential view of this area was performed in addition to standard views. At the palpable site there is an oval mass measuring approximately 1.2 cm. There are no additional suspicious findings elsewhere in the right breast.  Left breast: No suspicious mass, distortion, or microcalcifications are identified to suggest presence of malignancy.  On physical exam at the site of concern reported by the patient in the upper-outer right breast I feel a discrete mass.  Ultrasound:  Targeted  ultrasound is performed in the right breast at 10 o'clock 6 cm from the nipple demonstrating an oval circumscribed hypoechoic mass measuring 1.2 x 1.1 x 1.2 cm.  Targeted ultrasound of the right axilla demonstrates normal lymph nodes.  IMPRESSION: 1. Suspicious mass at the palpable site of concern in the right breast at 10 o'clock measuring 1.2 cm.  2. No mammographic evidence of malignancy in the left breast.  RECOMMENDATION: Ultrasound-guided core needle biopsy x1 of the right breast.  I have discussed the findings and recommendations with the patient who agrees to proceed with biopsy. The patient will be scheduled for the biopsy appointment prior to leaving the office today.  BI-RADS CATEGORY 4: Suspicious.   Pathology core needle biopsy: 06/06/2022 Breast, right, needle core biopsy, 10 o'clock, 6cmfn INVASIVE DUCTAL CARCINOMA, SEE NOTE TUBULE FORMATION: SCORE 3 NUCLEAR PLEOMORPHISM: SCORE 2 MITOTIC COUNT: SCORE 2 TOTAL SCORE: 7 OVERALL GRADE: 2 LYMPHOVASCULAR INVASION: NOT IDENTIFIED CANCER LENGTH: 1.1 CM CALCIFICATIONS: NOT IDENTIFIED OTHER FINDINGS: NUMEROUS INFILTRATING LYMPHOCYTES  Receptors: The tumor cells are positive for Her2 (3+). Estrogen Receptor: 100%, POSITIVE, STRONG STAINING INTENSITY Progesterone Receptor: 5%, POSITIVE, STRONG STAINING INTENSITY Proliferation Marker Ki67: 50%  Review of Systems: A complete review of systems was obtained from the patient. I have reviewed this information and discussed as appropriate with the patient. See HPI as well for other ROS.  Medical History: Past Medical History: Diagnosis Date Anemia Hypertension Thyroid disease  Patient Active Problem List Diagnosis Malignant neoplasm of upper-outer quadrant of right breast in female, estrogen receptor positive Family history of breast cancer  Past Surgical History: Procedure Laterality Date HYSTERECTOMY 09/2021   Allergies Allergen Reactions Latex  Itching  Current Outpatient Medications on File Prior to Visit Medication Sig Dispense Refill amLODIPine (NORVASC) 5 MG tablet Take 5 mg by mouth once daily  No current facility-administered medications on file prior to visit.  Family History Problem Relation Age of Onset Stroke Mother High blood pressure (Hypertension) Mother Diabetes Mother Breast cancer Sister High blood pressure (Hypertension) Sister High blood pressure (Hypertension) Brother   Social History  Tobacco Use Smoking Status Never Smokeless Tobacco Never   Social History  Socioeconomic History Marital status: Married Tobacco Use Smoking status: Never Smokeless tobacco: Never Substance and Sexual Activity Alcohol use: Never Drug use: Never  Objective:  Vitals: 06/21/22 1346 BP: 128/80 Pulse: 77 Temp: 36.4 C (97.6 F) SpO2: 99% Weight: 64.7 kg (142 lb 9.6 oz) Height: 167.6 cm ('5\' 6"'$ ) PainSc: 0-No pain  Body mass index is 23.02 kg/m.  Gen: No acute distress. Well nourished and well groomed. Neurological: Alert and oriented to person, place, and time. Coordination normal. Head: Normocephalic and atraumatic. Eyes: Conjunctivae are normal. Pupils are equal, round, and reactive to light. No scleral icterus. Neck: Normal range of motion. Neck supple. No tracheal deviation or thyromegaly present. Cardiovascular: Normal rate, regular rhythm, normal heart sounds and intact distal pulses. Exam reveals no gallop and no friction rub. No murmur heard. Breast: symmetric bilaterally. ? Slight contour defect UOQ right breast. Palpable mass near axilla around 10-10:30. This feels like around 1 cm. No LAD. No nipple retraction or nipple discharge. No skin dimpling. Respiratory: Effort normal. No respiratory distress. No chest wall tenderness. Breath sounds normal. No wheezes, rales or rhonchi. GI: Soft. Bowel sounds are normal. The abdomen is soft and nontender. There is no rebound and no  guarding. Musculoskeletal: Normal range of motion. Extremities are nontender. Lymphadenopathy: No cervical, preauricular, postauricular or axillary adenopathy is present Skin: Skin is warm and dry. No rash noted. No diaphoresis. No erythema. No pallor. No clubbing, cyanosis, or edema. Psychiatric: Normal mood and affect. Behavior is normal. Judgment and thought content normal.  Labs N/a  Assessment and Plan:  ICD-10-CM 1. Malignant neoplasm of upper-outer quadrant of right breast in female, estrogen receptor positive C50.411 Ambulatory Referral to Oncology-Medical Z17.0 Ambulatory Referral to [place]  2. Family history of breast cancer Z80.3   Patient is 7 with a new diagnosis of clinical T1c N0 right breast cancer, triple positive. I am referring the patient urgently to the cancer center and to genetics. Given her sisters young age at diagnosis and her young age at diagnosis, we are sending stat genetics panel today.  Barring unforeseen genetic findings, we are going to plan lumpectomy with sentinel node biopsy and port placement. She will need to discuss chemotherapy with oncology preoperatively to make sure that she is on board for port placement.  I discussed that her treatment would consist of surgery followed by chemotherapy followed by radiation, and then antihormonal treatment. I discussed that the entire and HER2 treatment will likely go on for a year.  Given the normal lymph nodes and the small size of tumor, I had not planned to order staging studies.  The mass in the breast is close enough to the axilla that I think we may be able to do this through 1 axillary incision.  I discussed risks of surgery including bleeding, infection, chronic pain, fluid buildup in the form of seroma or lymphedema, restricted range of motion, possible need for additional procedures or surgeries, possible dissatisfaction with breast appearance, possible heart or lung issues,  possible prolonged time off of work, possible clot, and others. I discussed the risk of pneumothorax with port placement.  I reviewed day of surgery events including the block and the nipple injection for the sentinel node biopsy. I reviewed that this is an outpatient surgery. I discussed that she would have 1 to 2 weeks of lifting restrictions. The patient and her spouse desire to do this as soon as possible.

## 2022-07-05 NOTE — Anesthesia Procedure Notes (Signed)
Anesthesia Regional Block: Pectoralis block   Pre-Anesthetic Checklist: , timeout performed,  Correct Patient, Correct Site, Correct Laterality,  Correct Procedure, Correct Position, site marked,  Risks and benefits discussed,  Surgical consent,  Pre-op evaluation,  At surgeon's request and post-op pain management  Laterality: Right  Prep: chloraprep       Needles:  Injection technique: Single-shot  Needle Type: Echogenic Stimulator Needle     Needle Length: 10cm  Needle Gauge: 20     Additional Needles:   Procedures:,,,, ultrasound used (permanent image in chart),,    Narrative:  Start time: 07/05/2022 8:10 AM End time: 07/05/2022 8:20 AM Injection made incrementally with aspirations every 5 mL.  Performed by: Personally  Anesthesiologist: Murvin Natal, MD  Additional Notes: Functioning IV was confirmed and monitors were applied.  A timeout was performed. Sterile prep, hand hygiene and sterile gloves were used. A 179m 20ga Bbraun echogenic stimulator needle was used. Negative aspiration and negative test dose prior to incremental administration of local anesthetic. The patient tolerated the procedure well.  Ultrasound guidance: relevent anatomy identified, needle position confirmed, local anesthetic spread visualized around nerve(s), vascular puncture avoided.  Image printed for medical record.

## 2022-07-06 ENCOUNTER — Encounter (HOSPITAL_BASED_OUTPATIENT_CLINIC_OR_DEPARTMENT_OTHER): Payer: Self-pay | Admitting: General Surgery

## 2022-07-08 LAB — SURGICAL PATHOLOGY

## 2022-07-12 ENCOUNTER — Encounter: Payer: Self-pay | Admitting: *Deleted

## 2022-07-19 ENCOUNTER — Inpatient Hospital Stay: Payer: BC Managed Care – PPO | Admitting: Dietician

## 2022-07-19 ENCOUNTER — Encounter: Payer: Self-pay | Admitting: Hematology and Oncology

## 2022-07-19 ENCOUNTER — Inpatient Hospital Stay (HOSPITAL_BASED_OUTPATIENT_CLINIC_OR_DEPARTMENT_OTHER): Payer: BC Managed Care – PPO | Admitting: Hematology and Oncology

## 2022-07-19 ENCOUNTER — Other Ambulatory Visit: Payer: Self-pay

## 2022-07-19 DIAGNOSIS — C50411 Malignant neoplasm of upper-outer quadrant of right female breast: Secondary | ICD-10-CM | POA: Diagnosis not present

## 2022-07-19 DIAGNOSIS — Z170 Estrogen receptor positive status [ER+]: Secondary | ICD-10-CM | POA: Diagnosis not present

## 2022-07-19 NOTE — Assessment & Plan Note (Addendum)
This is a very pleasant 52 year old premenopausal female patient with newly diagnosed right breast invasive ductal carcinoma T1 N0 M0 ER/PR and HER2 amplified referred to medical oncology for recommendations.  She arrived today to the appointment with her husband.  Given triple positive disease, we have discussed about considering adjuvant chemotherapy. We have briefly discussed about adverse effects of chemotherapy including but not limited to fatigue, nausea, vomiting, diarrhea, increased risk of infections, neuropathy, cardiotoxicity.  She will review detail chemotherapy side effects in the chemotherapy class..  She is here to review final pathology.  Final pathology showed tumor measuring 1.3 cm, negative margins, no evidence of lymph node involvement, final pathological staging pT1c N0 She once again had to guard the conversation so her husband can also review it.  We have once again discussed about role of adjuvant chemotherapy.  Given tumor is smaller than 3 cm, ER positivity and node negativity, I think she may benefit from Taxol Herceptin combination.  I have also mentioned about combination of docetaxel carboplatin and Herceptin which can be used in some patients.  She will be seeing Dr. Barry Dienes for postop appointment on February 5.  I will call her back on February 6 of February 7 and if she appears to have healed well, we will initiate adjuvant chemotherapy with Taxol Herceptin weekly for 12 cycles followed by adjuvant Herceptin for whole year. She will need a baseline echocardiogram  Thank you for consulting [place] in the care of this patient.  Please do not hesitate to contact us with any additional questions or concerns.

## 2022-07-19 NOTE — Progress Notes (Signed)
[facility] CONSULT NOTE  Patient Care Team: Martinique, [name], MD as PCP - General (Family Medicine) Mauro Kaufmann, RN as Oncology Nurse Navigator Rockwell Germany, RN as Oncology Nurse Navigator Benay Pike, MD as Consulting Physician (Hematology and Oncology)  CHIEF COMPLAINTS/PURPOSE OF CONSULTATION:  Newly diagnosed breast cancer  HISTORY OF PRESENTING ILLNESS:  Dawn [facility] 52 y.o. female is here because of recent diagnosis of right breast cancer  I reviewed her records extensively and collaborated the history with the patient.  SUMMARY OF ONCOLOGIC HISTORY: Oncology History  Malignant neoplasm of upper-outer quadrant of right breast in female, estrogen receptor positive ([place])  05/26/2022 Mammogram   Diagnostic mammogram after patient noticed a palpable lump in the right breast showed suspicious mass at the palpable site of concern in the right breast at 10:00 measuring 1.2 cm. Ultrasound of the right breast showed no evidence of lymphadenopathy.   06/06/2022 Pathology Results   Right breast needle core biopsy at 10:00 showed overall grade 2 invasive ductal carcinoma.  Prognostic showed ER 100% positive strong staining PR 5% positive strong staining, Ki-67 of 50% HER2 3+ by [facility]   06/06/2022 Cancer Staging   Staging form: Breast, AJCC 8th Edition - Clinical stage from 06/06/2022: Stage IA (cT1c, cN0, cM0, G3, ER+, PR+, HER2+) - Signed by Benay Pike, MD on 07/19/2022 Stage prefix: Initial diagnosis Histologic grading system: 3 grade system   06/28/2022 Initial Diagnosis   Malignant neoplasm of upper-outer quadrant of right breast in female, estrogen receptor positive ([place])     Patient arrived to the appointment today with her husband. Since last visit she had right breast lumpectomy with pathology showing invasive poorly differentiated adenocarcinoma with lymphoid stroma grade 3, tumor measures 1.3 x 1.2 x 1.0 cm, margins free 2 lymph nodes  negative for carcinoma She came for the follow-up today by herself.  She requested permission to regard our conversation today so her husband can review it.  She has her postop appointment scheduled with Dr. Barry Dienes on February 5.  She has family history significant for breast cancer, younger sister had breast cancer at 66 and died from 55, had triple negative breast cancer.  She also has a maternal aunt who had breast cancer.  She has 3 kids.  She is a Education officer, museum by occupation.  At baseline she has Graves' disease, hypertension, had radioactive iodine treatment in February 2022, otherwise denies any major health issues.    She denies any concerns with healing today.  MEDICAL HISTORY:  Past Medical History:  Diagnosis Date   Abnormal uterine bleeding 2022   Anemia 02/12/2021   Patient is taking iron supplementation.   Cancer [facility])    breast   COVID-19 2020   cold-like symptoms   Endometriosis    Fibroids    Graves disease    Hypertension    Hyperthyroidism 2022   s/p Nuclear Medicine RAI therapy on 08/07/20   Seasonal allergies    severe chronic allergies, runny nose, congestion, cough when she lays down due to drainage   Vitamin D deficiency 2021   Wears glasses     SURGICAL HISTORY: Past Surgical History:  Procedure Laterality Date   BREAST BIOPSY Right 06/06/2022   [place] RT BREAST BX W LOC DEV 1ST LESION IMG BX SPEC US GUIDE 06/06/2022 [facility] MAMMOGRAPHY   BREAST LUMPECTOMY WITH SENTINEL LYMPH NODE BIOPSY Right 07/05/2022   Procedure: RIGHT BREAST LUMPECTOMY WITH SENTINEL LYMPH NODE BX;  Surgeon: Stark Klein, MD;  Location: [place];  Service: General;  Laterality: Right;   CYSTOSCOPY  10/04/2021   Procedure: CYSTOSCOPY;  Surgeon: Crawford Givens, MD;  Location: [place];  Service: Gynecology;;   LAPAROSCOPIC VAGINAL HYSTERECTOMY WITH SALPINGECTOMY N/A 10/04/2021   Procedure: LAPAROSCOPIC ASSISTED VAGINAL HYSTERECTOMY WITH SALPINGECTOMY;   Surgeon: Crawford Givens, MD;  Location: [place];  Service: Gynecology;  Laterality: N/A;   PORTACATH PLACEMENT Left 07/05/2022   Procedure: INSERTION PORT-A-CATH;  Surgeon: Stark Klein, MD;  Location: [place];  Service: General;  Laterality: Left;   TUBAL LIGATION  09/2008    SOCIAL HISTORY: Social History   Socioeconomic History   Marital status: Married    Spouse name: Not on file   Number of children: Not on file   Years of education: Not on file   Highest education level: Not on file  Occupational History   Not on file  Tobacco Use   Smoking status: Never   Smokeless tobacco: Never  Vaping Use   Vaping Use: Never used  Substance and Sexual Activity   Alcohol use: No   Drug use: No   Sexual activity: Yes    Partners: Male    Birth control/protection: Surgical    Comment: BTL   Other Topics Concern   Not on file  Social History Narrative   Not on file   Social Determinants of Health   Financial Resource Strain: Not on file  Food Insecurity: Not on file  Transportation Needs: Not on file  Physical Activity: Not on file  Stress: Not on file  Social Connections: Not on file  Intimate Partner Violence: Not on file    FAMILY HISTORY: Family History  Problem Relation Age of Onset   Breast cancer Sister 58       triple negative; d. early 85s   Cancer Paternal Aunt        unknown type   Thyroid cancer Half-Sister        mat half sister; dx 29s; unknown cell type   Maternal uncle with prostate cancer, multiple myeloma.  ALLERGIES:  is allergic to latex.  MEDICATIONS:  Current Outpatient Medications  Medication Sig Dispense Refill   amLODipine (NORVASC) 5 MG tablet TAKE 1 TABLET (5 MG TOTAL) BY MOUTH DAILY. 90 tablet 1   oxyCODONE (OXY IR/ROXICODONE) 5 MG immediate release tablet Take 1 tablet (5 mg total) by mouth every 6 (six) hours as needed for severe pain. 8 tablet 0   oxyCODONE-acetaminophen (PERCOCET/ROXICET) 5-325  MG tablet Take 1 tablet by mouth every 6 (six) hours as needed for moderate pain. (Patient not taking: Reported on 06/28/2022) 30 tablet 0   promethazine (PHENERGAN) 12.5 MG tablet Take 1 tablet (12.5 mg total) by mouth every 6 (six) hours as needed for nausea or vomiting. 30 tablet 0   valACYclovir (VALTREX) 500 MG tablet Take 500 mg by mouth as needed.     Vitamin D, Ergocalciferol, (DRISDOL) 1.25 MG (50000 UNIT) CAPS capsule Take 1 capsule (50,000 Units total) by mouth every 7 (seven) days. 12 capsule 0   No current facility-administered medications for this visit.    REVIEW OF SYSTEMS:   Constitutional: Denies fevers, chills or abnormal night sweats Eyes: Denies blurriness of vision, double vision or watery eyes Ears, nose, mouth, throat, and face: Denies mucositis or sore throat Respiratory: Denies cough, dyspnea or wheezes Cardiovascular: Denies palpitation, chest discomfort or lower extremity swelling Gastrointestinal:  Denies nausea, heartburn or change in bowel habits  Skin: Denies abnormal skin rashes Lymphatics: Denies new lymphadenopathy or easy bruising Neurological:Denies numbness, tingling or new weaknesses Behavioral/Psych: Mood is stable, no new changes  Breast:  Denies any palpable lumps or discharge All other systems were reviewed with the patient and are negative.  PHYSICAL EXAMINATION: ECOG PERFORMANCE STATUS: 0 - Asymptomatic  Vitals:   07/19/22 1155  BP: 108/71  Pulse: 66  Resp: 16  Temp: 97.9 F (36.6 C)  SpO2: 100%    Filed Weights   07/19/22 1155  Weight: 138 lb 12.8 oz (63 kg)   Physical exam deferred today in lieu of counseling  LABORATORY DATA:  I have reviewed the data as listed Lab Results  Component Value Date   WBC 9.5 10/05/2021   HGB 8.6 (L) 10/05/2021   HCT 28.5 (L) 10/05/2021   MCV 77.0 (L) 10/05/2021   PLT 177 10/05/2021   Lab Results  Component Value Date   NA 134 (L) 10/05/2021   K 4.1 10/05/2021   CL 104 10/05/2021   CO2 24  10/05/2021    RADIOGRAPHIC STUDIES: I have personally reviewed the radiological reports and agreed with the findings in the report.  ASSESSMENT AND PLAN:  Malignant neoplasm of upper-outer quadrant of right breast in female, estrogen receptor positive ([place]) This is a very pleasant 52 year old premenopausal female patient with newly diagnosed right breast invasive ductal carcinoma T1 N0 M0 ER/PR and HER2 amplified referred to medical oncology for recommendations.  She arrived today to the appointment with her husband.  Given triple positive disease, we have discussed about considering adjuvant chemotherapy.  We have briefly discussed about 2 possible regimens which is trastuzumab with paclitaxel versus TCH which is carboplatin/docetaxel with trastuzumab.  Since she is young and without any major medical comorbidities TCH will be my preferred adjuvant regimen.  We have discussed that [place] could be considered in patients with low risk disease as well.  We have briefly discussed about adverse effects of chemotherapy including but not limited to fatigue, nausea, vomiting, diarrhea, increased risk of infections, neuropathy, cardiotoxicity.  We have discussed that some of the side effects can be permanent.  She had several good questions and all her questions were answered to the best my knowledge.  She had some questions regarding postponing surgery and genetic testing results.  Sent in basket message to genetics team who will coordinate genetic testing with her.  I encouraged her to call Dr. Marlowe Aschoff office to discuss postponement of surgery.  After adjuvant chemotherapy, depending on the type of surgery she chooses, she will proceed with radiation.  She had questions about role of mastectomy versus lumpectomy.  She understands that despite lumpectomy or mastectomy we may still recommend adjuvant chemotherapy.  After completion of radiation, she will then proceed on with adjuvant antiestrogen therapy.  Thank  you for consulting [place] in the care of this patient.  Please do not hesitate to contact us with any additional questions or concerns.   Total time spent: 30 minutes including history, physical exam, review of records, counseling and coordination of care All questions were answered. The patient knows to call the clinic with any problems, questions or concerns.    Benay Pike, MD 07/19/22

## 2022-07-20 ENCOUNTER — Encounter: Payer: Self-pay | Admitting: Genetic Counselor

## 2022-07-20 ENCOUNTER — Ambulatory Visit (HOSPITAL_COMMUNITY)
Admission: RE | Admit: 2022-07-20 | Discharge: 2022-07-20 | Disposition: A | Discharge status: Home / Self Care | Payer: BC Managed Care – PPO | Source: Ambulatory Visit | Attending: Hematology and Oncology | Admitting: Hematology and Oncology

## 2022-07-20 DIAGNOSIS — E785 Hyperlipidemia, unspecified: Secondary | ICD-10-CM | POA: Insufficient documentation

## 2022-07-20 DIAGNOSIS — Z0189 Encounter for other specified special examinations: Secondary | ICD-10-CM

## 2022-07-20 DIAGNOSIS — I10 Essential (primary) hypertension: Secondary | ICD-10-CM | POA: Insufficient documentation

## 2022-07-20 DIAGNOSIS — Z170 Estrogen receptor positive status [ER+]: Secondary | ICD-10-CM | POA: Diagnosis not present

## 2022-07-20 DIAGNOSIS — Z1379 Encounter for other screening for genetic and chromosomal anomalies: Secondary | ICD-10-CM | POA: Insufficient documentation

## 2022-07-20 DIAGNOSIS — C50411 Malignant neoplasm of upper-outer quadrant of right female breast: Secondary | ICD-10-CM

## 2022-07-20 LAB — ECHOCARDIOGRAM COMPLETE
Area-P 1/2: 3.06 cm2
S' Lateral: 3.1 cm

## 2022-07-26 ENCOUNTER — Telehealth: Payer: Self-pay | Admitting: Hematology and Oncology

## 2022-07-26 ENCOUNTER — Encounter: Payer: Self-pay | Admitting: Hematology and Oncology

## 2022-07-26 ENCOUNTER — Inpatient Hospital Stay: Payer: BC Managed Care – PPO | Attending: Hematology and Oncology | Admitting: Hematology and Oncology

## 2022-07-26 DIAGNOSIS — Z170 Estrogen receptor positive status [ER+]: Secondary | ICD-10-CM | POA: Diagnosis not present

## 2022-07-26 DIAGNOSIS — Z95828 Presence of other vascular implants and grafts: Secondary | ICD-10-CM | POA: Diagnosis not present

## 2022-07-26 DIAGNOSIS — C50411 Malignant neoplasm of upper-outer quadrant of right female breast: Secondary | ICD-10-CM | POA: Diagnosis not present

## 2022-07-26 DIAGNOSIS — D508 Other iron deficiency anemias: Secondary | ICD-10-CM | POA: Insufficient documentation

## 2022-07-26 DIAGNOSIS — Z5111 Encounter for antineoplastic chemotherapy: Secondary | ICD-10-CM | POA: Insufficient documentation

## 2022-07-26 DIAGNOSIS — Z5112 Encounter for antineoplastic immunotherapy: Secondary | ICD-10-CM | POA: Insufficient documentation

## 2022-07-26 MED ORDER — ONDANSETRON HCL 8 MG PO TABS: 8.0000 mg | ORAL_TABLET | Freq: Three times a day (TID) | ORAL | 1 refills | Status: DC | PRN

## 2022-07-26 MED ORDER — LIDOCAINE-PRILOCAINE 2.5-2.5 % EX CREA: TOPICAL_CREAM | CUTANEOUS | 3 refills | Status: DC

## 2022-07-26 MED ORDER — PROCHLORPERAZINE MALEATE 10 MG PO TABS: 10.0000 mg | ORAL_TABLET | Freq: Four times a day (QID) | ORAL | 1 refills | Status: DC | PRN

## 2022-07-26 NOTE — Assessment & Plan Note (Addendum)
This is a very pleasant 52 year old premenopausal female patient with newly diagnosed right breast invasive ductal carcinoma T1 N0 M0 ER/PR and HER2 amplified referred to medical oncology for recommendations.  Given triple positive disease, we have discussed about considering adjuvant chemotherapy.  She now here after surgery, final pathology showed tumor measuring 1.3 x 1.2 x 1.0 cm, negative margins, negative lymph nodes.  I have today once again discussed about considering adjuvant Taxol and Herceptin followed by Herceptin maintenance for a whole year.   She is agreeable to proceed, treatment plan placed. Scheduling message sent. Patient needs to be scheduled for chemo class, infusion, labs and visit starting next week. She will be a candidate for adjuvant antiestrogen therapy after completing adjuvant radiation.  Benay Pike MD

## 2022-07-26 NOTE — Telephone Encounter (Signed)
Spoke with patient confirming all upcoming appointments  

## 2022-07-26 NOTE — Progress Notes (Signed)
START ON PATHWAY REGIMEN - Breast     Cycle 1: A cycle is 7 days:     Trastuzumab-xxxx      Paclitaxel    Cycles 2 through 12: A cycle is every 7 days:     Trastuzumab-xxxx      Paclitaxel    Cycles 13 through 25: A cycle is every 21 days:     Trastuzumab-xxxx   **Always confirm dose/schedule in your pharmacy ordering system**  Patient Characteristics: Postoperative without Neoadjuvant Therapy (Pathologic Staging), Invasive Disease, Adjuvant Therapy, HER2 Positive, ER Positive, Node Negative, pT1c, pN0/N1mi Therapeutic Status: Postoperative without Neoadjuvant Therapy (Pathologic Staging) AJCC Grade: G3 AJCC N Category: pN0 AJCC M Category: cM0 ER Status: Positive (+) AJCC 8 Stage Grouping: IA HER2 Status: Positive (+) Oncotype Dx Recurrence Score: Not Appropriate AJCC T Category: pT1c PR Status: Positive (+) Intent of Therapy: Curative Intent, Discussed with Patient 

## 2022-07-26 NOTE — Progress Notes (Signed)
[facility] CONSULT NOTE  Patient Care Team: Martin[place], [name], MD as PCP - General (Family Medicine) Mauro Kaufmann, RN as Oncology Nurse Navigator Rockwell [place], RN as Oncology Nurse Navigator Benay Pike, MD as Consulting Physician (Hematology and Oncology)  CHIEF COMPLAINTS/PURPOSE OF CONSULTATION:  Newly diagnosed breast cancer  HISTORY OF PRESENTING ILLNESS:  Dawn [facility] 52 y.o. female is here because of recent diagnosis of right breast cancer  I reviewed her records extensively and collaborated the history with the patient.  SUMMARY OF ONCOLOGIC HISTORY: Oncology History  Malignant neoplasm of upper-outer quadrant of right breast in female, estrogen receptor positive ([place])  05/26/2022 Mammogram   Diagnostic mammogram after patient noticed a palpable lump in the right breast showed suspicious mass at the palpable site of concern in the right breast at 10:00 measuring 1.2 cm. Ultrasound of the right breast showed no evidence of lymphadenopathy.   06/06/2022 Pathology Results   Right breast needle core biopsy at 10:00 showed overall grade 2 invasive ductal carcinoma.  Prognostic showed ER 100% positive strong staining PR 5% positive strong staining, Ki-67 of 50% HER2 3+ by [facility]   06/06/2022 Cancer Staging   Staging form: Breast, AJCC 8th Edition - Clinical stage from 06/06/2022: Stage IA (cT1c, cN0, cM0, G3, ER+, PR+, HER2+) - Signed by Benay Pike, MD on 07/19/2022 Stage prefix: Initial diagnosis Histologic grading system: 3 grade system   06/26/2022 Genetic Testing   Negative [facility] CancerNext Expanded Panel.  Report date is 06/26/2022.   The CancerNext-Expanded gene panel offered by [facility] and includes sequencing and rearrangement analysis for the following 77 genes: AIP, ALK, APC, ATM, AXIN2, BAP1, BARD1, BLM, BMPR1A, BRCA1, BRCA2, BRIP1, CDC73, CDH1, CDK4, CDKN1B, CDKN2A, CHEK2, CTNNA1, DICER1, FANCC, FH, FLCN, GALNT12, KIF1B, LZTR1, MAX,  MEN1, MET, MLH1, MSH2, MSH3, MSH6, MUTYH, NBN, NF1, NF2, NTHL1, PALB2, PHOX2B, PMS2, POT1, PRKAR1A, PTCH1, PTEN, RAD51C, RAD51D, RB1, RECQL, RET, SDHA, SDHAF2, SDHB, SDHC, SDHD, SMAD4, SMARCA4, SMARCB1, SMARCE1, STK11, SUFU, TMEM127, TP53, TSC1, TSC2, VHL and XRCC2 (sequencing and deletion/duplication); EGFR, EGLN1, HOXB13, KIT, MITF, PDGFRA, POLD1, and POLE (sequencing only); EPCAM and GREM1 (deletion/duplication only).    06/28/2022 Initial Diagnosis   Malignant neoplasm of upper-outer quadrant of right breast in female, estrogen receptor positive ([place])   08/02/2022 -  Chemotherapy   Patient is on Treatment Plan : BREAST Paclitaxel + Trastuzumab q7d / Trastuzumab q21d       Patient arrived to the appointment today with her husband. Since last visit she had right breast lumpectomy with pathology showing invasive poorly differentiated adenocarcinoma with lymphoid stroma grade 3, tumor measures 1.3 x 1.2 x 1.0 cm, margins free 2 lymph nodes negative for carcinoma ER 100% strong staining PR 5% strong staining HER2 3+ by IHC and Ki-67 of 50%.  2 out of 2 sentinel lymph nodes are negative for involvement She is here for a telephone follow up. She wants to proceed with adjuvant TH. She is healing well from surgery. Rest of the pertinent 10 point ROS reviewed and negative.  MEDICAL HISTORY:  Past Medical History:  Diagnosis Date   Abnormal uterine bleeding 2022   Anemia 02/12/2021   Patient is taking iron supplementation.   Cancer [facility])    breast   COVID-19 2020   cold-like symptoms   Endometriosis    Fibroids    Graves disease    Hypertension    Hyperthyroidism 2022   s/p Nuclear Medicine RAI therapy on 08/07/20   Seasonal allergies  severe chronic allergies, runny nose, congestion, cough when she lays down due to drainage   Vitamin D deficiency 2021   Wears glasses     SURGICAL HISTORY: Past Surgical History:  Procedure Laterality Date   BREAST BIOPSY Right 06/06/2022   [place] RT  BREAST BX W LOC DEV 1ST LESION IMG BX SPEC US GUIDE 06/06/2022 [facility]MOGRAPHY   BREAST LUMPECTOMY WITH SENTINEL LYMPH NODE BIOPSY Right 07/05/2022   Procedure: RIGHT BREAST LUMPECTOMY WITH SENTINEL LYMPH NODE BX;  Surgeon: Stark Klein, MD;  Location: [place];  Service: General;  Laterality: Right;   CYSTOSCOPY  10/04/2021   Procedure: CYSTOSCOPY;  Surgeon: Crawford Givens, MD;  Location: [place];  Service: Gynecology;;   LAPAROSCOPIC VAGINAL HYSTERECTOMY WITH SALPINGECTOMY N/A 10/04/2021   Procedure: LAPAROSCOPIC ASSISTED VAGINAL HYSTERECTOMY WITH SALPINGECTOMY;  Surgeon: Crawford Givens, MD;  Location: [place];  Service: Gynecology;  Laterality: N/A;   PORTACATH PLACEMENT Left 07/05/2022   Procedure: INSERTION PORT-A-CATH;  Surgeon: Stark Klein, MD;  Location: [place];  Service: General;  Laterality: Left;   TUBAL LIGATION  09/2008    SOCIAL HISTORY: Social History   Socioeconomic History   Marital status: Married    Spouse name: Not on file   Number of children: Not on file   Years of education: Not on file   Highest education level: Not on file  Occupational History   Not on file  Tobacco Use   Smoking status: Never   Smokeless tobacco: Never  Vaping Use   Vaping Use: Never used  Substance and Sexual Activity   Alcohol use: No   Drug use: No   Sexual activity: Yes    Partners: Male    Birth control/protection: Surgical    Comment: BTL   Other Topics Concern   Not on file  Social History Narrative   Not on file   Social Determinants of Health   Financial Resource Strain: Not on file  Food Insecurity: Not on file  Transportation Needs: Not on file  Physical Activity: Not on file  Stress: Not on file  Social Connections: Not on file  Intimate Partner Violence: Not on file    FAMILY HISTORY: Family History  Problem Relation Age of Onset   Breast cancer Sister 60       triple negative; d.  early 56s   Cancer Paternal Aunt        unknown type   Thyroid cancer Half-Sister        mat half sister; dx 60s; unknown cell type   Maternal uncle with prostate cancer, multiple myeloma.  ALLERGIES:  is allergic to latex.  MEDICATIONS:  Current Outpatient Medications  Medication Sig Dispense Refill   amLODipine (NORVASC) 5 MG tablet TAKE 1 TABLET (5 MG TOTAL) BY MOUTH DAILY. 90 tablet 1   lidocaine-prilocaine (EMLA) cream Apply to affected area once 30 g 3   ondansetron (ZOFRAN) 8 MG tablet Take 1 tablet (8 mg total) by mouth every 8 (eight) hours as needed for nausea or vomiting. 30 tablet 1   oxyCODONE (OXY IR/ROXICODONE) 5 MG immediate release tablet Take 1 tablet (5 mg total) by mouth every 6 (six) hours as needed for severe pain. 8 tablet 0   oxyCODONE-acetaminophen (PERCOCET/ROXICET) 5-325 MG tablet Take 1 tablet by mouth every 6 (six) hours as needed for moderate pain. (Patient not taking: Reported on 06/28/2022) 30 tablet 0   prochlorperazine (COMPAZINE) 10 MG tablet Take 1 tablet (10  mg total) by mouth every 6 (six) hours as needed for nausea or vomiting. 30 tablet 1   promethazine (PHENERGAN) 12.5 MG tablet Take 1 tablet (12.5 mg total) by mouth every 6 (six) hours as needed for nausea or vomiting. 30 tablet 0   valACYclovir (VALTREX) 500 MG tablet Take 500 mg by mouth as needed.     Vitamin D, Ergocalciferol, (DRISDOL) 1.25 MG (50000 UNIT) CAPS capsule Take 1 capsule (50,000 Units total) by mouth every 7 (seven) days. 12 capsule 0   No current facility-administered medications for this visit.    REVIEW OF SYSTEMS:   Constitutional: Denies fevers, chills or abnormal night sweats Eyes: Denies blurriness of vision, double vision or watery eyes Ears, nose, mouth, throat, and face: Denies mucositis or sore throat Respiratory: Denies cough, dyspnea or wheezes Cardiovascular: Denies palpitation, chest discomfort or lower extremity swelling Gastrointestinal:  Denies nausea,  heartburn or change in bowel habits Skin: Denies abnormal skin rashes Lymphatics: Denies new lymphadenopathy or easy bruising Neurological:Denies numbness, tingling or new weaknesses Behavioral/Psych: Mood is stable, no new changes  Breast:  Denies any palpable lumps or discharge All other systems were reviewed with the patient and are negative.  PHYSICAL EXAMINATION: ECOG PERFORMANCE STATUS: 0 - Asymptomatic VS not done.  Physical exam deferred today in lieu of telephone visit  LABORATORY DATA:  I have reviewed the data as listed Lab Results  Component Value Date   WBC 9.5 10/05/2021   HGB 8.6 (L) 10/05/2021   HCT 28.5 (L) 10/05/2021   MCV 77.0 (L) 10/05/2021   PLT 177 10/05/2021   Lab Results  Component Value Date   NA 134 (L) 10/05/2021   K 4.1 10/05/2021   CL 104 10/05/2021   CO2 24 10/05/2021    RADIOGRAPHIC STUDIES: I have personally reviewed the radiological reports and agreed with the findings in the report.  ASSESSMENT AND PLAN:  Malignant neoplasm of upper-outer quadrant of right breast in female, estrogen receptor positive ([place]) This is a very pleasant 52 year old premenopausal female patient with newly diagnosed right breast invasive ductal carcinoma T1 N0 M0 ER/PR and HER2 amplified referred to medical oncology for recommendations.  Given triple positive disease, we have discussed about considering adjuvant chemotherapy.  She now here after surgery, final pathology showed tumor measuring 1.3 x 1.2 x 1.0 cm, negative margins, negative lymph nodes.  I have today once again discussed about considering adjuvant Taxol and Herceptin followed by Herceptin maintenance for a whole year.   She is agreeable to proceed, treatment plan placed. Scheduling message sent. Patient needs to be scheduled for chemo class, infusion, labs and visit starting next week. She will be a candidate for adjuvant antiestrogen therapy after completing adjuvant radiation.  Benay Pike  MD   I connected with  Adele A Grays on 07/26/22 by a telephone application and verified that I am speaking with the correct person using two identifiers.   I discussed the limitations of evaluation and management by telemedicine. The patient expressed understanding and agreed to proceed.   Total time spent: 30 minutes including history, physical exam, review of records, counseling and coordination of care All questions were answered. The patient knows to call the clinic with any problems, questions or concerns.    Benay Pike, MD 07/26/22

## 2022-07-27 ENCOUNTER — Other Ambulatory Visit: Payer: Self-pay

## 2022-07-28 ENCOUNTER — Encounter: Payer: Self-pay | Admitting: *Deleted

## 2022-07-29 ENCOUNTER — Other Ambulatory Visit: Payer: Self-pay | Admitting: Family Medicine

## 2022-07-29 ENCOUNTER — Inpatient Hospital Stay: Payer: BC Managed Care – PPO

## 2022-08-01 MED FILL — Dexamethasone Sodium Phosphate Inj 100 MG/10ML: INTRAMUSCULAR | Qty: 1 | Status: AC

## 2022-08-02 ENCOUNTER — Other Ambulatory Visit: Payer: Self-pay

## 2022-08-02 ENCOUNTER — Inpatient Hospital Stay: Payer: BC Managed Care – PPO

## 2022-08-02 ENCOUNTER — Inpatient Hospital Stay (HOSPITAL_BASED_OUTPATIENT_CLINIC_OR_DEPARTMENT_OTHER): Payer: BC Managed Care – PPO | Admitting: Adult Health

## 2022-08-02 ENCOUNTER — Encounter: Payer: Self-pay | Admitting: *Deleted

## 2022-08-02 ENCOUNTER — Encounter: Payer: Self-pay | Admitting: Adult Health

## 2022-08-02 ENCOUNTER — Encounter: Payer: Self-pay | Admitting: Hematology and Oncology

## 2022-08-02 VITALS — BP 128/82 | HR 66 | Temp 98.0°F | Resp 16

## 2022-08-02 VITALS — BP 137/85 | HR 67 | Temp 97.9°F | Resp 18 | Ht 66.0 in | Wt 140.5 lb

## 2022-08-02 DIAGNOSIS — D509 Iron deficiency anemia, unspecified: Secondary | ICD-10-CM

## 2022-08-02 DIAGNOSIS — Z95828 Presence of other vascular implants and grafts: Secondary | ICD-10-CM | POA: Diagnosis not present

## 2022-08-02 DIAGNOSIS — Z170 Estrogen receptor positive status [ER+]: Secondary | ICD-10-CM

## 2022-08-02 DIAGNOSIS — Z5112 Encounter for antineoplastic immunotherapy: Secondary | ICD-10-CM | POA: Diagnosis present

## 2022-08-02 DIAGNOSIS — C50411 Malignant neoplasm of upper-outer quadrant of right female breast: Secondary | ICD-10-CM | POA: Diagnosis present

## 2022-08-02 DIAGNOSIS — D508 Other iron deficiency anemias: Secondary | ICD-10-CM | POA: Diagnosis not present

## 2022-08-02 DIAGNOSIS — Z5111 Encounter for antineoplastic chemotherapy: Secondary | ICD-10-CM | POA: Diagnosis present

## 2022-08-02 HISTORY — DX: Presence of other vascular implants and grafts: Z95.828

## 2022-08-02 LAB — CBC WITH DIFFERENTIAL (CANCER CENTER ONLY)
Abs Immature Granulocytes: 0 10*3/uL (ref 0.00–0.07)
Basophils Absolute: 0 10*3/uL (ref 0.0–0.1)
Basophils Relative: 1 %
Eosinophils Absolute: 0.1 10*3/uL (ref 0.0–0.5)
Eosinophils Relative: 3 %
HCT: 29.5 % — ABNORMAL LOW (ref 36.0–46.0)
Hemoglobin: 9.2 g/dL — ABNORMAL LOW (ref 12.0–15.0)
Immature Granulocytes: 0 %
Lymphocytes Relative: 45 %
Lymphs Abs: 1.8 10*3/uL (ref 0.7–4.0)
MCH: 24.1 pg — ABNORMAL LOW (ref 26.0–34.0)
MCHC: 31.2 g/dL (ref 30.0–36.0)
MCV: 77.2 fL — ABNORMAL LOW (ref 80.0–100.0)
Monocytes Absolute: 0.3 10*3/uL (ref 0.1–1.0)
Monocytes Relative: 8 %
Neutro Abs: 1.7 10*3/uL (ref 1.7–7.7)
Neutrophils Relative %: 43 %
Platelet Count: 204 10*3/uL (ref 150–400)
RBC: 3.82 MIL/uL — ABNORMAL LOW (ref 3.87–5.11)
RDW: 17.8 % — ABNORMAL HIGH (ref 11.5–15.5)
WBC Count: 4 10*3/uL (ref 4.0–10.5)
nRBC: 0 % (ref 0.0–0.2)

## 2022-08-02 LAB — VITAMIN B12: Vitamin B-12: 305 pg/mL (ref 180–914)

## 2022-08-02 LAB — CMP (CANCER CENTER ONLY)
ALT: 11 U/L (ref 0–44)
AST: 14 U/L — ABNORMAL LOW (ref 15–41)
Albumin: 4.1 g/dL (ref 3.5–5.0)
Alkaline Phosphatase: 64 U/L (ref 38–126)
Anion gap: 4 — ABNORMAL LOW (ref 5–15)
BUN: 16 mg/dL (ref 6–20)
CO2: 29 mmol/L (ref 22–32)
Calcium: 9.4 mg/dL (ref 8.9–10.3)
Chloride: 106 mmol/L (ref 98–111)
Creatinine: 0.69 mg/dL (ref 0.44–1.00)
GFR, Estimated: >60 mL/min (ref >60)
Glucose, Bld: 93 mg/dL (ref 70–99)
Potassium: 3.7 mmol/L (ref 3.5–5.1)
Sodium: 139 mmol/L (ref 135–145)
Total Bilirubin: 0.3 mg/dL (ref 0.3–1.2)
Total Protein: 7 g/dL (ref 6.5–8.1)

## 2022-08-02 LAB — IRON AND IRON BINDING CAPACITY (CC-WL,HP ONLY)
Iron: 19 ug/dL — ABNORMAL LOW (ref 28–170)
Saturation Ratios: 5 % — ABNORMAL LOW (ref 10.4–31.8)
TIBC: 409 ug/dL (ref 250–450)
UIBC: 390 ug/dL (ref 148–442)

## 2022-08-02 LAB — FERRITIN: Ferritin: 7 ng/mL — ABNORMAL LOW (ref 11–307)

## 2022-08-02 MED ORDER — SODIUM CHLORIDE 0.9% FLUSH
10.0000 mL | Freq: Once | INTRAVENOUS | Status: AC
  Administered 2022-08-02: 10 mL

## 2022-08-02 MED ORDER — FAMOTIDINE IN NACL 20-0.9 MG/50ML-% IV SOLN
20.0000 mg | Freq: Once | INTRAVENOUS | Status: AC
  Administered 2022-08-02: 20 mg via INTRAVENOUS
  Filled 2022-08-02: qty 50

## 2022-08-02 MED ORDER — HEPARIN SOD (PORK) LOCK FLUSH 100 UNIT/ML IV SOLN
500.0000 [IU] | Freq: Once | INTRAVENOUS | Status: AC | PRN
  Administered 2022-08-02: 500 [IU]

## 2022-08-02 MED ORDER — TRASTUZUMAB-ANNS CHEMO 150 MG IV SOLR
4.0000 mg/kg | Freq: Once | INTRAVENOUS | Status: AC
  Administered 2022-08-02: 252 mg via INTRAVENOUS
  Filled 2022-08-02: qty 12

## 2022-08-02 MED ORDER — SODIUM CHLORIDE 0.9 % IV SOLN: Freq: Once | INTRAVENOUS | Status: AC

## 2022-08-02 MED ORDER — DIPHENHYDRAMINE HCL 50 MG/ML IJ SOLN
50.0000 mg | Freq: Once | INTRAMUSCULAR | Status: AC
  Administered 2022-08-02: 50 mg via INTRAVENOUS
  Filled 2022-08-02: qty 1

## 2022-08-02 MED ORDER — SODIUM CHLORIDE 0.9% FLUSH
10.0000 mL | INTRAVENOUS | Status: DC | PRN
  Administered 2022-08-02: 10 mL

## 2022-08-02 MED ORDER — SODIUM CHLORIDE 0.9 % IV SOLN
10.0000 mg | Freq: Once | INTRAVENOUS | Status: AC
  Administered 2022-08-02: 10 mg via INTRAVENOUS
  Filled 2022-08-02: qty 10

## 2022-08-02 MED ORDER — ACETAMINOPHEN 325 MG PO TABS
650.0000 mg | ORAL_TABLET | Freq: Once | ORAL | Status: AC
  Administered 2022-08-02: 650 mg via ORAL
  Filled 2022-08-02: qty 2

## 2022-08-02 MED ORDER — SODIUM CHLORIDE 0.9 % IV SOLN
80.0000 mg/m2 | Freq: Once | INTRAVENOUS | Status: AC
  Administered 2022-08-02: 138 mg via INTRAVENOUS
  Filled 2022-08-02: qty 23

## 2022-08-02 NOTE — Progress Notes (Signed)
Dawn Bates Follow up:    Dawn Bates, [place] [address]   DIAGNOSIS:  Bates Staging  Malignant neoplasm of upper-outer quadrant of right breast in female, estrogen receptor positive ([place]) Staging form: Breast, AJCC 8th Edition - Clinical stage from 06/06/2022: Stage IA (cT1c, cN0, cM0, G3, ER+, PR+, HER2+) - Signed by Dawn Pike, MD on 07/19/2022 Stage prefix: Initial diagnosis Histologic grading system: 3 grade system - Pathologic stage from 07/05/2022: Stage IA (pT1c, pN0, cM0, G3, ER+, PR+, HER2+) - Signed by Dawn Phlegm, NP on 08/02/2022 Stage prefix: Initial diagnosis Histologic grading system: 3 grade system   SUMMARY OF ONCOLOGIC HISTORY: Oncology History  Malignant neoplasm of upper-outer quadrant of right breast in female, estrogen receptor positive ([place])  05/26/2022 Mammogram   Diagnostic mammogram after patient noticed a palpable lump in the right breast showed suspicious mass at the palpable site of concern in the right breast at 10:00 measuring 1.2 cm. Ultrasound of the right breast showed no evidence of lymphadenopathy.   06/06/2022 Pathology Results   Right breast needle core biopsy at 10:00 showed overall grade 2 invasive ductal carcinoma.  Prognostic showed ER 100% positive strong staining PR 5% positive strong staining, Ki-67 of 50% HER2 3+ by [facility]   06/06/2022 Bates Staging   Staging form: Breast, AJCC 8th Edition - Clinical stage from 06/06/2022: Stage IA (cT1c, cN0, cM0, G3, ER+, PR+, HER2+) - Signed by Dawn Pike, MD on 07/19/2022 Stage prefix: Initial diagnosis Histologic grading system: 3 grade system   06/26/2022 Genetic Testing   Negative [facility] CancerNext Expanded Panel.  Report date is 06/26/2022.   The CancerNext-Expanded gene panel offered by [facility] and includes sequencing and rearrangement analysis for the following 77 genes: AIP, ALK, APC, ATM, AXIN2, BAP1, BARD1, BLM,  BMPR1A, BRCA1, BRCA2, BRIP1, CDC73, CDH1, CDK4, CDKN1B, CDKN2A, CHEK2, CTNNA1, DICER1, FANCC, FH, FLCN, GALNT12, KIF1B, LZTR1, MAX, MEN1, MET, MLH1, MSH2, MSH3, MSH6, MUTYH, NBN, NF1, NF2, NTHL1, PALB2, PHOX2B, PMS2, POT1, PRKAR1A, PTCH1, PTEN, RAD51C, RAD51D, RB1, RECQL, RET, SDHA, SDHAF2, SDHB, SDHC, SDHD, SMAD4, SMARCA4, SMARCB1, SMARCE1, STK11, SUFU, TMEM127, TP53, TSC1, TSC2, VHL and XRCC2 (sequencing and deletion/duplication); EGFR, EGLN1, HOXB13, KIT, MITF, PDGFRA, POLD1, and POLE (sequencing only); EPCAM and GREM1 (deletion/duplication only).    06/28/2022 Initial Diagnosis   Malignant neoplasm of upper-outer quadrant of right breast in female, estrogen receptor positive ([place])   07/05/2022 Surgery   Right breast lumpectomy: 1.3 cm invasive poorly differentiated ductal carcinoma grade 3, margins negative, 2 sentinel lymph nodes negative   07/05/2022 Bates Staging   Staging form: Breast, AJCC 8th Edition - Pathologic stage from 07/05/2022: Stage IA (pT1c, pN0, cM0, G3, ER+, PR+, HER2+) - Signed by Dawn Phlegm, NP on 08/02/2022 Stage prefix: Initial diagnosis Histologic grading system: 3 grade system   08/02/2022 -  Chemotherapy   Patient is on Treatment Plan : BREAST Paclitaxel + Trastuzumab q7d / Trastuzumab q21d       CURRENT THERAPY:Taxol/Herceptin  INTERVAL HISTORY: Dawn Bates 52 y.o. female returns for f/u prior to beginning Taxol and Herceptin.  Her most recent echo occurred on 07/20/2022 and demonstrated a LVEF of 60-65%.    She is doing well today.  She tells me her surgery site is healing well and has no signs of infection.  Her hemoglobin today is 9.2.  In review of her labs it looks like over the past year it has remained in the nines and tens.  She tells me that she underwent hysterectomy for heavy menstrual cycles back in March and has been taking oral iron.  She wants to know if she should restart her oral iron supplementation.  She is still adjusting to  the port in her body and so far this is the most unpleasant thing she has been through.  She is awaiting the "[place] smooth hand and body cream" to be delivered from [employer].  She has her antinausea medications. Patient Active Problem List   Diagnosis Date Noted   Port-A-Cath in place 08/02/2022   Genetic testing 07/20/2022   Malignant neoplasm of upper-outer quadrant of right breast in female, estrogen receptor positive ([place]) 06/28/2022   Abnormal uterine and vaginal bleeding, unspecified 10/04/2021   S/P laparoscopic assisted vaginal hysterectomy ([facility]) 10/04/2021   Hypothyroidism 07/28/2021   Menorrhagia 07/28/2021   Bilateral lower extremity edema 04/23/2021   Primary hypertension 02/12/2021   Elevated LFTs 05/22/2020   Thyroid nodule 10/10/2019   Headache, unspecified headache type 07/16/2019   Non-seasonal allergic rhinitis due to pollen 07/16/2019   Vitamin D deficiency, unspecified 07/16/2019   Hyperlipidemia 07/16/2019   Anemia 08/25/2017    is allergic to latex.  MEDICAL HISTORY: Past Medical History:  Diagnosis Date   Abnormal uterine bleeding 2022   Anemia 02/12/2021   Patient is taking iron supplementation.   [facility])    breast   COVID-19 2020   cold-like symptoms   Endometriosis    Fibroids    Graves disease    Hypertension    Hyperthyroidism 2022   s/p Nuclear Medicine RAI therapy on 08/07/20   Seasonal allergies    severe chronic allergies, runny nose, congestion, cough when she lays down due to drainage   Vitamin D deficiency 2021   Wears glasses     SURGICAL HISTORY: Past Surgical History:  Procedure Laterality Date   BREAST BIOPSY Right 06/06/2022   [place] RT BREAST BX W LOC DEV 1ST LESION IMG BX SPEC US GUIDE 06/06/2022 [facility] MAMMOGRAPHY   BREAST LUMPECTOMY WITH SENTINEL LYMPH NODE BIOPSY Right 07/05/2022   Procedure: RIGHT BREAST LUMPECTOMY WITH SENTINEL LYMPH NODE BX;  Surgeon: Dawn Klein, MD;  Location: [place];  Service:  General;  Laterality: Right;   CYSTOSCOPY  10/04/2021   Procedure: CYSTOSCOPY;  Surgeon: Crawford Givens, MD;  Location: [place];  Service: Gynecology;;   LAPAROSCOPIC VAGINAL HYSTERECTOMY WITH SALPINGECTOMY N/A 10/04/2021   Procedure: LAPAROSCOPIC ASSISTED VAGINAL HYSTERECTOMY WITH SALPINGECTOMY;  Surgeon: Crawford Givens, MD;  Location: [place];  Service: Gynecology;  Laterality: N/A;   PORTACATH PLACEMENT Left 07/05/2022   Procedure: INSERTION PORT-A-CATH;  Surgeon: Dawn Klein, MD;  Location: [place];  Service: General;  Laterality: Left;   TUBAL LIGATION  09/2008    SOCIAL HISTORY: Social History   Socioeconomic History   Marital status: Married    Spouse name: Not on file   Number of children: Not on file   Years of education: Not on file   Highest education level: Not on file  Occupational History   Not on file  Tobacco Use   Smoking status: Never   Smokeless tobacco: Never  Vaping Use   Vaping Use: Never used  Substance and Sexual Activity   Alcohol use: No   Drug use: No   Sexual activity: Yes    Partners: Male    Birth control/protection: Surgical    Comment: BTL   Other Topics Concern   Not on file  Social History Narrative   Not on file   Social Determinants of Health   Financial Resource Strain: Not on file  Food Insecurity: Not on file  Transportation Needs: Not on file  Physical Activity: Not on file  Stress: Not on file  Social Connections: Not on file  Intimate Partner Violence: Not on file    FAMILY HISTORY: Family History  Problem Relation Age of Onset   Breast Bates Sister 42       triple negative; d. early 82s   Bates Paternal Aunt        unknown type   Thyroid Bates Half-Sister        mat half sister; dx 67s; unknown cell type    Review of Systems  Constitutional:  Positive for fatigue. Negative for appetite change, chills, fever and unexpected weight change.  HENT:   Negative  for hearing loss, lump/mass and trouble swallowing.   Eyes:  Negative for eye problems and icterus.  Respiratory:  Negative for chest tightness, cough and shortness of breath.   Cardiovascular:  Negative for chest pain, leg swelling and palpitations.  Gastrointestinal:  Negative for abdominal distention, abdominal pain, constipation, diarrhea, nausea and vomiting.  Endocrine: Negative for hot flashes.  Genitourinary:  Negative for difficulty urinating.   Musculoskeletal:  Negative for arthralgias.  Skin:  Negative for itching and rash.  Neurological:  Negative for dizziness, extremity weakness, headaches and numbness.  Hematological:  Negative for adenopathy. Does not bruise/bleed easily.  Psychiatric/Behavioral:  Negative for depression. The patient is not nervous/anxious.       PHYSICAL EXAMINATION  ECOG PERFORMANCE STATUS: 1 - Symptomatic but completely ambulatory  Vitals:   08/02/22 1040  BP: 137/85  Pulse: 67  Resp: 18  Temp: 97.9 F (36.6 C)  SpO2: 100%    Physical Exam Constitutional:      General: She is not in acute distress.    Appearance: Normal appearance. She is not toxic-appearing.  HENT:     Head: Normocephalic and atraumatic.  Eyes:     General: No scleral icterus. Cardiovascular:     Rate and Rhythm: Normal rate and regular rhythm.     Pulses: Normal pulses.     Heart sounds: Normal heart sounds.  Pulmonary:     Effort: Pulmonary effort is normal.     Breath sounds: Normal breath sounds.  Chest:     Comments: Lumpectomy site healing well no signs of infection Abdominal:     General: Abdomen is flat. Bowel sounds are normal. There is no distension.     Palpations: Abdomen is soft.     Tenderness: There is no abdominal tenderness.  Musculoskeletal:        General: No swelling.     Cervical back: Neck supple.  Lymphadenopathy:     Cervical: No cervical adenopathy.  Skin:    General: Skin is warm and dry.     Findings: No rash.  Neurological:      General: No focal deficit present.     Mental Status: She is alert.  Psychiatric:        Mood and Affect: Mood normal.        Behavior: Behavior normal.     LABORATORY DATA:  CBC    Component Value Date/Time   WBC 4.0 08/02/2022 1011   WBC 9.5 10/05/2021 0046   RBC 3.82 (L) 08/02/2022 1011   HGB 9.2 (L) 08/02/2022 1011   HCT 29.5 (L) 08/02/2022 1011   PLT 204 08/02/2022 1011  MCV 77.2 (L) 08/02/2022 1011   MCH 24.1 (L) 08/02/2022 1011   MCHC 31.2 08/02/2022 1011   RDW 17.8 (H) 08/02/2022 1011   LYMPHSABS 1.8 08/02/2022 1011   MONOABS 0.3 08/02/2022 1011   EOSABS 0.1 08/02/2022 1011   BASOSABS 0.0 08/02/2022 1011    CMP     Component Value Date/Time   NA 139 08/02/2022 1011   K 3.7 08/02/2022 1011   CL 106 08/02/2022 1011   CO2 29 08/02/2022 1011   GLUCOSE 93 08/02/2022 1011   BUN 16 08/02/2022 1011   CREATININE 0.69 08/02/2022 1011   CREATININE 0.78 04/23/2021 1505   CALCIUM 9.4 08/02/2022 1011   PROT 7.0 08/02/2022 1011   ALBUMIN 4.1 08/02/2022 1011   AST 14 (L) 08/02/2022 1011   ALT 11 08/02/2022 1011   ALKPHOS 64 08/02/2022 1011   BILITOT 0.3 08/02/2022 1011   GFRNONAA >60 08/02/2022 1011   GFRNONAA 115 05/21/2020 1708   GFRAA 133 05/21/2020 1708         ASSESSMENT and THERAPY PLAN:   Malignant neoplasm of upper-outer quadrant of right breast in female, estrogen receptor positive (HCC) Dawn Bates is a 52 year old woman with history of stage Ia triple positive right-sided breast Bates diagnosed in December 2023 status postlumpectomy here today to begin adjuvant chemotherapy with Taxol and Herceptin.  We reviewed her overall oncology treatment plan which she understands and is in agreement with.  We discussed Taxol in detail today including the risks and benefits including the risk of peripheral neuropathy which she understands.  I counseled her on cryotherapy as an option to help prevent neuropathy.  I asked the nurses to help her with this  today.  We also discussed Herceptin and the of weakening in the muscle of the heart that can occasionally happen.  We reviewed her echocardiogram which was normal and discussed that we will repeat this every 3 months she is undergoing Herceptin therapy.  Since her lotion had not come in and I was able to secure some [employer] lotion that we give her breast Bates patients.    We discussed her labs in particular the microcytic anemia.  It has remained persistent despite undergoing hysterectomy back in March 2023.  We will repeat iron studies today to get a baseline.  Should this persist following her chemotherapy treatment we can discuss the possibility of GI referral.  Dawn Bates will return in 1 week for labs, follow-up with Dr. Chryl Heck, and her next infusion.  She knows to call for any questions or concerns between now and her next visit we can always see her sooner.   All questions were answered. The patient knows to call the clinic with any problems, questions or concerns. We can certainly see the patient much sooner if necessary.  Total encounter time:30 minutes*in face-to-face visit time, chart review, lab review, care coordination, order entry, and documentation of the encounter time.    Wilber Bihari, NP 08/02/22 11:37 AM Medical Oncology and Hematology [facility] [place], [place] [address] Tel. [phone]    Fax. [phone]  *Total Encounter Time as defined by the Centers for Medicare and Medicaid Services includes, in addition to the face-to-face time of a patient visit (documented in the note above) non-face-to-face time: obtaining and reviewing outside history, ordering and reviewing medications, tests or procedures, care coordination (communications with other health care professionals or caregivers) and documentation in the medical record.

## 2022-08-02 NOTE — Assessment & Plan Note (Signed)
Dawn Bates is a 52 year old woman with history of stage Ia triple positive right-sided breast cancer diagnosed in December 2023 status postlumpectomy here today to begin adjuvant chemotherapy with Taxol and Herceptin.  We reviewed her overall oncology treatment plan which she understands and is in agreement with.  We discussed Taxol in detail today including the risks and benefits including the risk of peripheral neuropathy which she understands.  I counseled her on cryotherapy as an option to help prevent neuropathy.  I asked the nurses to help her with this today.  We also discussed Herceptin and the of weakening in the muscle of the heart that can occasionally happen.  We reviewed her echocardiogram which was normal and discussed that we will repeat this every 3 months she is undergoing Herceptin therapy.  Since her lotion had not come in and I was able to secure some [employer] lotion that we give her breast cancer patients.    We discussed her labs in particular the microcytic anemia.  It has remained persistent despite undergoing hysterectomy back in March 2023.  We will repeat iron studies today to get a baseline.  Should this persist following her chemotherapy treatment we can discuss the possibility of GI referral.  Dawn Bates will return in 1 week for labs, follow-up with Dr. Chryl Heck, and her next infusion.  She knows to call for any questions or concerns between now and her next visit we can always see her sooner.

## 2022-08-02 NOTE — Patient Instructions (Signed)
[facility]  Discharge Instructions: Thank you for choosing [facility] to provide your oncology and hematology care.   If you have a lab appointment with the [facility], please go directly to the [facility] and check in at the registration area.   Wear comfortable clothing and clothing appropriate for easy access to any Portacath or PICC line.   We strive to give you quality time with your provider. You may need to reschedule your appointment if you arrive late (15 or more minutes).  Arriving late affects you and other patients whose appointments are after yours.  Also, if you miss three or more appointments without notifying the office, you may be dismissed from the clinic at the provider's discretion.      For prescription refill requests, have your pharmacy contact our office and allow 72 hours for refills to be completed.    Today you received the following chemotherapy and/or immunotherapy agents: Kinjinti, Taxol      To help prevent nausea and vomiting after your treatment, we encourage you to take your nausea medication as directed.  BELOW ARE SYMPTOMS THAT SHOULD BE REPORTED IMMEDIATELY: *FEVER GREATER THAN 100.4 F (38 C) OR HIGHER *CHILLS OR SWEATING *NAUSEA AND VOMITING THAT IS NOT CONTROLLED WITH YOUR NAUSEA MEDICATION *UNUSUAL SHORTNESS OF BREATH *UNUSUAL BRUISING OR BLEEDING *URINARY PROBLEMS (pain or burning when urinating, or frequent urination) *BOWEL PROBLEMS (unusual diarrhea, constipation, pain near the anus) TENDERNESS IN MOUTH AND THROAT WITH OR WITHOUT PRESENCE OF ULCERS (sore throat, sores in mouth, or a toothache) UNUSUAL RASH, SWELLING OR PAIN  UNUSUAL VAGINAL DISCHARGE OR ITCHING   Items with * indicate a potential emergency and should be followed up as soon as possible or go to the Emergency Department if any problems should occur.  Please show the CHEMOTHERAPY ALERT CARD or IMMUNOTHERAPY ALERT CARD at  check-in to the Emergency Department and triage nurse.  Should you have questions after your visit or need to cancel or reschedule your appointment, please contact [facility]  Dept: [phone]  and follow the prompts.  Office hours are 8:00 a.m. to 4:30 p.m. Monday - Friday. Please note that voicemails left after 4:00 p.m. may not be returned until the following business day.  We are closed weekends and major holidays. You have access to a nurse at all times for urgent questions. Please call the main number to the clinic Dept: [phone] and follow the prompts.   For any non-urgent questions, you may also contact your provider using MyChart. We now offer e-Visits for anyone 57 and older to request care online for non-urgent symptoms. For details visit [email or web address].   Also download the MyChart app! Go to the app store, search "MyChart", open the app, select [facility], and log in with your MyChart username and password.  Trastuzumab Injection What is this medication? TRASTUZUMAB (tras TOO zoo mab) treats breast cancer and stomach cancer. It works by blocking a protein that causes cancer cells to grow and multiply. This helps to slow or stop the spread of cancer cells. This medicine may be used for other purposes; ask your health care provider or pharmacist if you have questions. COMMON BRAND NAME(S): Herceptin, Janae Bridgeman, Ontruzant, Trazimera What should I tell my care team before I take this medication? They need to know if you have any of these conditions: Heart failure Lung disease An unusual or allergic reaction to trastuzumab, other  medications, foods, dyes, or preservatives Pregnant or trying to get pregnant Breast-feeding How should I use this medication? This medication is injected into a vein. It is given by your care team in a hospital or clinic setting. Talk to your care team about the use of this medication in  children. It is not approved for use in children. Overdosage: If you think you have taken too much of this medicine contact a poison control center or emergency room at once. NOTE: This medicine is only for you. Do not share this medicine with others. What if I miss a dose? Keep appointments for follow-up doses. It is important not to miss your dose. Call your care team if you are unable to keep an appointment. What may interact with this medication? Certain types of chemotherapy, such as daunorubicin, doxorubicin, epirubicin, idarubicin This list may not describe all possible interactions. Give your health care provider a list of all the medicines, herbs, non-prescription drugs, or dietary supplements you use. Also tell them if you smoke, drink alcohol, or use illegal drugs. Some items may interact with your medicine. What should I watch for while using this medication? Your condition will be monitored carefully while you are receiving this medication. This medication may make you feel generally unwell. This is not uncommon, as chemotherapy affects healthy cells as well as cancer cells. Report any side effects. Continue your course of treatment even though you feel ill unless your care team tells you to stop. This medication may increase your risk of getting an infection. Call your care team for advice if you get a fever, chills, sore throat, or other symptoms of a cold or flu. Do not treat yourself. Try to avoid being around people who are sick. Avoid taking medications that contain aspirin, acetaminophen, ibuprofen, naproxen, or ketoprofen unless instructed by your care team. These medications can hide a fever. Talk to your care team if you may be pregnant. Serious birth defects can occur if you take this medication during pregnancy and for 7 months after the last dose. You will need a negative pregnancy test before starting this medication. Contraception is recommended while taking this medication  and for 7 months after the last dose. Your care team can help you find the option that works for you. Do not breastfeed while taking this medication and for 7 months after stopping treatment. What side effects may I notice from receiving this medication? Side effects that you should report to your care team as soon as possible: Allergic reactions or angioedema--skin rash, itching or hives, swelling of the face, eyes, lips, tongue, arms, or legs, trouble swallowing or breathing Dry cough, shortness of breath or trouble breathing Heart failure--shortness of breath, swelling of the ankles, feet, or hands, sudden weight gain, unusual weakness or fatigue Infection--fever, chills, cough, or sore throat Infusion reactions--chest pain, shortness of breath or trouble breathing, feeling faint or lightheaded Side effects that usually do not require medical attention (report to your care team if they continue or are bothersome): Diarrhea Dizziness Headache Nausea Trouble sleeping Vomiting This list may not describe all possible side effects. Call your doctor for medical advice about side effects. You may report side effects to FDA at 1-800-FDA-1088. Where should I keep my medication? This medication is given in a hospital or clinic. It will not be stored at home. NOTE: This sheet is a summary. It may not cover all possible information. If you have questions about this medicine, talk to your doctor, pharmacist, or health  care provider.  2023 Elsevier/Gold Standard (2021-10-07 00:00:00)  Paclitaxel Injection What is this medication? PACLITAXEL (PAK li TAX el) treats some types of cancer. It works by slowing down the growth of cancer cells. This medicine may be used for other purposes; ask your health care provider or pharmacist if you have questions. COMMON BRAND NAME(S): Onxol, Taxol What should I tell my care team before I take this medication? They need to know if you have any of these  conditions: Heart disease Liver disease Low white blood cell levels An unusual or allergic reaction to paclitaxel, other medications, foods, dyes, or preservatives If you or your partner are pregnant or trying to get pregnant Breast-feeding How should I use this medication? This medication is injected into a vein. It is given by your care team in a hospital or clinic setting. Talk to your care team about the use of this medication in children. While it may be given to children for selected conditions, precautions do apply. Overdosage: If you think you have taken too much of this medicine contact a poison control center or emergency room at once. NOTE: This medicine is only for you. Do not share this medicine with others. What if I miss a dose? Keep appointments for follow-up doses. It is important not to miss your dose. Call your care team if you are unable to keep an appointment. What may interact with this medication? Do not take this medication with any of the following: Live virus vaccines Other medications may affect the way this medication works. Talk with your care team about all of the medications you take. They may suggest changes to your treatment plan to lower the risk of side effects and to make sure your medications work as intended. This list may not describe all possible interactions. Give your health care provider a list of all the medicines, herbs, non-prescription drugs, or dietary supplements you use. Also tell them if you smoke, drink alcohol, or use illegal drugs. Some items may interact with your medicine. What should I watch for while using this medication? Your condition will be monitored carefully while you are receiving this medication. You may need blood work while taking this medication. This medication may make you feel generally unwell. This is not uncommon as chemotherapy can affect healthy cells as well as cancer cells. Report any side effects. Continue your  course of treatment even though you feel ill unless your care team tells you to stop. This medication can cause serious allergic reactions. To reduce the risk, your care team may give you other medications to take before receiving this one. Be sure to follow the directions from your care team. This medication may increase your risk of getting an infection. Call your care team for advice if you get a fever, chills, sore throat, or other symptoms of a cold or flu. Do not treat yourself. Try to avoid being around people who are sick. This medication may increase your risk to bruise or bleed. Call your care team if you notice any unusual bleeding. Be careful brushing or flossing your teeth or using a toothpick because you may get an infection or bleed more easily. If you have any dental work done, tell your dentist you are receiving this medication. Talk to your care team if you may be pregnant. Serious birth defects can occur if you take this medication during pregnancy. Talk to your care team before breastfeeding. Changes to your treatment plan may be needed. What side effects may  I notice from receiving this medication? Side effects that you should report to your care team as soon as possible: Allergic reactions--skin rash, itching, hives, swelling of the face, lips, tongue, or throat Heart rhythm changes--fast or irregular heartbeat, dizziness, feeling faint or lightheaded, chest pain, trouble breathing Increase in blood pressure Infection--fever, chills, cough, sore throat, wounds that don't heal, pain or trouble when passing urine, general feeling of discomfort or being unwell Low blood pressure--dizziness, feeling faint or lightheaded, blurry vision Low red blood cell level--unusual weakness or fatigue, dizziness, headache, trouble breathing Painful swelling, warmth, or redness of the skin, blisters or sores at the infusion site Pain, tingling, or numbness in the hands or feet Slow  heartbeat--dizziness, feeling faint or lightheaded, confusion, trouble breathing, unusual weakness or fatigue Unusual bruising or bleeding Side effects that usually do not require medical attention (report to your care team if they continue or are bothersome): Diarrhea Hair loss Joint pain Loss of appetite Muscle pain Nausea Vomiting This list may not describe all possible side effects. Call your doctor for medical advice about side effects. You may report side effects to FDA at 1-800-FDA-1088. Where should I keep my medication? This medication is given in a hospital or clinic. It will not be stored at home. NOTE: This sheet is a summary. It may not cover all possible information. If you have questions about this medicine, talk to your doctor, pharmacist, or health care provider.  2023 Elsevier/Gold Standard (2021-10-06 00:00:00)

## 2022-08-03 ENCOUNTER — Telehealth: Payer: Self-pay | Admitting: *Deleted

## 2022-08-03 ENCOUNTER — Ambulatory Visit: Payer: BC Managed Care – PPO | Attending: General Surgery | Admitting: Rehabilitation

## 2022-08-03 ENCOUNTER — Encounter: Payer: Self-pay | Admitting: Rehabilitation

## 2022-08-03 ENCOUNTER — Other Ambulatory Visit: Payer: Self-pay

## 2022-08-03 DIAGNOSIS — Z483 Aftercare following surgery for neoplasm: Secondary | ICD-10-CM | POA: Diagnosis present

## 2022-08-03 DIAGNOSIS — Z9189 Other specified personal risk factors, not elsewhere classified: Secondary | ICD-10-CM | POA: Insufficient documentation

## 2022-08-03 DIAGNOSIS — Z170 Estrogen receptor positive status [ER+]: Secondary | ICD-10-CM | POA: Diagnosis present

## 2022-08-03 DIAGNOSIS — C50411 Malignant neoplasm of upper-outer quadrant of right female breast: Secondary | ICD-10-CM | POA: Diagnosis present

## 2022-08-03 NOTE — Telephone Encounter (Signed)
-----   Message from Scot Dock, RN sent at 08/02/2022  4:23 PM EST ----- Regarding: Dr. Chryl Heck first time chemo f/u - tol well

## 2022-08-03 NOTE — Telephone Encounter (Signed)
Called pt to see how she did with her treatment.  She reports doing well & denies any problems,  She knows her next appt & how to reach [place] if needed.

## 2022-08-03 NOTE — Therapy (Signed)
OUTPATIENT PHYSICAL THERAPY ONCOLOGY EVALUATION  Patient Name: Dawn Bates MRN: [id number] DOB:[birth year], 52 y.o., female Today's Date: 08/03/2022  END OF SESSION:  PT End of Session - 08/03/22 1555     Visit Number 1    Number of Visits 1    PT Start Time [id number]    PT Stop Time 1550    PT Time Calculation (min) 35 min    Activity Tolerance Patient tolerated treatment well    Behavior During Therapy [facility] for tasks assessed/performed             Past Medical History:  Diagnosis Date   Abnormal uterine bleeding 2022   Anemia 02/12/2021   Patient is taking iron supplementation.   Cancer [facility])    breast   COVID-19 2020   cold-like symptoms   Endometriosis    Fibroids    Graves disease    Hypertension    Hyperthyroidism 2022   s/p Nuclear Medicine RAI therapy on 08/07/20   Seasonal allergies    severe chronic allergies, runny nose, congestion, cough when she lays down due to drainage   Vitamin D deficiency 2021   Wears glasses    Past Surgical History:  Procedure Laterality Date   BREAST BIOPSY Right 06/06/2022   [place] RT BREAST BX W LOC DEV 1ST LESION IMG BX SPEC US GUIDE 06/06/2022 [facility] MAMMOGRAPHY   BREAST LUMPECTOMY WITH SENTINEL LYMPH NODE BIOPSY Right 07/05/2022   Procedure: RIGHT BREAST LUMPECTOMY WITH SENTINEL LYMPH NODE BX;  Surgeon: Stark Klein, MD;  Location: [facility];  Service: General;  Laterality: Right;   CYSTOSCOPY  10/04/2021   Procedure: CYSTOSCOPY;  Surgeon: Crawford Givens, MD;  Location: [facility];  Service: Gynecology;;   LAPAROSCOPIC VAGINAL HYSTERECTOMY WITH SALPINGECTOMY N/A 10/04/2021   Procedure: LAPAROSCOPIC ASSISTED VAGINAL HYSTERECTOMY WITH SALPINGECTOMY;  Surgeon: Crawford Givens, MD;  Location: [facility];  Service: Gynecology;  Laterality: N/A;   PORTACATH PLACEMENT Left 07/05/2022   Procedure: INSERTION PORT-A-CATH;  Surgeon: Stark Klein, MD;  Location: [facility];   Service: General;  Laterality: Left;   TUBAL LIGATION  09/2008   Patient Active Problem List   Diagnosis Date Noted   Port-A-Cath in place 08/02/2022   Genetic testing 07/20/2022   Malignant neoplasm of upper-outer quadrant of right breast in female, estrogen receptor positive ([facility]) 06/28/2022   Abnormal uterine and vaginal bleeding, unspecified 10/04/2021   S/P laparoscopic assisted vaginal hysterectomy (LAVH) 10/04/2021   Hypothyroidism 07/28/2021   Menorrhagia 07/28/2021   Bilateral lower extremity edema 04/23/2021   Primary hypertension 02/12/2021   Elevated LFTs 05/22/2020   Thyroid nodule 10/10/2019   Headache, unspecified headache type 07/16/2019   Non-seasonal allergic rhinitis due to pollen 07/16/2019   Vitamin D deficiency, unspecified 07/16/2019   Hyperlipidemia 07/16/2019   Anemia 08/25/2017    PCP: Betty Martinique, MD  REFERRING PROVIDER: Dr. Barry Dienes  REFERRING DIAG:  Diagnosis  C50.411 (ICD-10-CM) - Malignant neoplasm of upper-outer quadrant of right female breast  Z17.0 (ICD-10-CM) - Estrogen receptor positive status (ER+)   THERAPY DIAG:  Malignant neoplasm of upper-outer quadrant of right breast in female, estrogen receptor positive ([facility])  Aftercare following surgery for neoplasm  At risk for lymphedema  ONSET DATE: 05/26/22  Rationale for Evaluation and Treatment: Rehabilitation  SUBJECTIVE:  SUBJECTIVE STATEMENT: They just want me to check in after surgery.  I am feeling normal in terms of my motion.    PERTINENT HISTORY: Rt breast lumpectomy with 2 negative nodes removed on 07/05/22. Started Paclitaxel/Herceptin 08/02/22. Graves disease, Hyperthryoidism, HTN  PAIN:  Are you having pain? No  PRECAUTIONS: Rt lymphedema risk  WEIGHT BEARING RESTRICTIONS: No  FALLS:   Has patient fallen in last 6 months? 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  No limitations   LEISURE: Walking the dog.    HAND DOMINANCE: right   PRIOR LEVEL OF FUNCTION: Independent  PATIENT GOALS: See if I'm doing okay.     OBJECTIVE:  COGNITION: Overall cognitive status: Within functional limits for tasks assessed   PALPATION: No cording noted.    OBSERVATIONS / OTHER ASSESSMENTS: Not wearing compression anymore, wearing camis, port on left side. No edema lateral trunk noted anymore  POSTURE: WNL  UPPER EXTREMITY AROM/PROM:  A/PROM RIGHT   eval   Shoulder extension 55  Shoulder flexion 170  Shoulder abduction 160  Shoulder internal rotation   Shoulder external rotation 90    (Blank rows = not tested)  A/PROM LEFT   eval  Shoulder extension 55  Shoulder flexion 165  Shoulder abduction 160  Shoulder internal rotation   Shoulder external rotation 95    (Blank rows = not tested)  CERVICAL AROM: All within normal limits:   UPPER EXTREMITY STRENGTH: WNL  LYMPHEDEMA ASSESSMENTS:   LANDMARK RIGHT  eval  At axilla 25.6  15 cm proximal to olecranon 24.5  10 cm proximal to olecranon process 24.8  Olecranon process 23.5  15cm proximal to ulnar styloid 22  10 cm proximal to ulnar styloid process 18.7  Just proximal to ulnar styloid process 15.5  Across hand at thumb web space 18.7  At base of 2nd digit 6.1  (Blank rows = not tested)   LANDMARK LEFT  eval  At axilla 26.3  15cm proximal to olecranon 25.3  10 cm proximal to olecranon process 26.1  Olecranon process 24.5  15cm proximal to ulnar styloid 22.5  10 cm proximal to ulnar styloid process 19.2  Just proximal to ulnar styloid process 16  Across hand at thumb web space 19  At base of 2nd digit 5.8  (Blank rows = not tested)  QUICK DASH SURVEY: 0%  TODAY'S TREATMENT:                                                                                                                                          DATE: 08/03/22 Eval performed  PATIENT EDUCATION:  Education details: POC, updated HEP/recommended stretches, lymphedema risk reduction and ABC class options Person educated: Patient Education method: Explanation, Demonstration, and Handouts Education comprehension: verbalized understanding  HOME EXERCISE  PROGRAM: Access Code: [email or web address] URL: [email or web address] Date: 08/03/2022 Prepared by: Shan Levans  Exercises - Doorway Pec Stretch at 90 Degrees Abduction  - 1 x daily - 7 x weekly - 1-3 sets - 10 reps - 20-30 seconds hold - Standing Shoulder Abduction Slides at Wall  - 1 x daily - 7 x weekly - 1-3 sets - 10 reps - 20-30 seconds hold - Standing Single Shoulder Flexion Wall Slide with Palm Up  - 1 x daily - 7 x weekly - 1-3 sets - 10 reps - 20-30 seconds hold - Supine Chest Stretch with Elbows Bent  - 1 x daily - 7 x weekly - 1 sets - 3 reps - 30-60seconds hold  ASSESSMENT:  CLINICAL IMPRESSION: Patient is a 52 y.o. female who was seen today for physical therapy evaluation and treatment for her Rt breast cancer. She is doing very well and has excellent ROM.  Her incisions are well healed with some glue present. Discussed good radiation stretches, scar massage, walking importance, and lymphedema risk reduction with handouts for all.     OBJECTIVE IMPAIRMENTS: decreased knowledge of medical condition  ACTIVITY LIMITATIONS: none  PARTICIPATION LIMITATIONS: community activity  PERSONAL FACTORS: 1 comorbidity: SLNB  are also affecting patient's functional outcome.   REHAB POTENTIAL: Excellent  CLINICAL DECISION MAKING: Stable/uncomplicated  EVALUATION COMPLEXITY: Low  GOALS: Goals reviewed with patient? Yes  SHORT TERM GOALS =LTGs: Target date: 08/03/22  Pt will be educated on lymphedema risk reduction Baseline: Goal status:  MET  2.  Pt will be educated on stretches for radiation and after Baseline:  Goal status: MET  3.  Pt will know when to return to PT Baseline:  Goal status: MET  PLAN:  PT FREQUENCY: one time visit  PT DURATION: 1 week  PLANNED INTERVENTIONS: Therapeutic exercises, Patient/Family education, Self Care, and Re-evaluation  PLAN FOR NEXT SESSION: return as needed.    Stark Bray, PT 08/03/2022, 3:55 PM  PHYSICAL THERAPY DISCHARGE SUMMARY  Visits from Start of Care: 1   Plan: Patient agrees to discharge.  Patient is being discharged due to meeting the stated rehab goals.

## 2022-08-08 ENCOUNTER — Encounter: Payer: Self-pay | Admitting: Hematology and Oncology

## 2022-08-08 MED FILL — Dexamethasone Sodium Phosphate Inj 100 MG/10ML: INTRAMUSCULAR | Qty: 1 | Status: AC

## 2022-08-08 NOTE — Progress Notes (Signed)
Called pt to introduce myself as her Arboriculturist and to discuss the [employer]. Pt would like to apply so she will bring her proof of income on 08/09/22. If approved I will give her an expense sheet and my card for any questions or concerns she may have in the future.

## 2022-08-09 ENCOUNTER — Inpatient Hospital Stay: Payer: BC Managed Care – PPO

## 2022-08-09 ENCOUNTER — Encounter: Payer: Self-pay | Admitting: Hematology and Oncology

## 2022-08-09 ENCOUNTER — Other Ambulatory Visit: Payer: Self-pay

## 2022-08-09 ENCOUNTER — Inpatient Hospital Stay (HOSPITAL_BASED_OUTPATIENT_CLINIC_OR_DEPARTMENT_OTHER): Payer: BC Managed Care – PPO | Admitting: Hematology and Oncology

## 2022-08-09 ENCOUNTER — Ambulatory Visit: Payer: BC Managed Care – PPO | Admitting: Dietician

## 2022-08-09 VITALS — BP 120/78 | HR 61 | Resp 16

## 2022-08-09 VITALS — BP 140/88 | HR 70 | Temp 98.1°F | Resp 16 | Ht 66.0 in | Wt 136.8 lb

## 2022-08-09 DIAGNOSIS — C50411 Malignant neoplasm of upper-outer quadrant of right female breast: Secondary | ICD-10-CM | POA: Diagnosis not present

## 2022-08-09 DIAGNOSIS — Z170 Estrogen receptor positive status [ER+]: Secondary | ICD-10-CM | POA: Diagnosis not present

## 2022-08-09 DIAGNOSIS — Z95828 Presence of other vascular implants and grafts: Secondary | ICD-10-CM

## 2022-08-09 DIAGNOSIS — D509 Iron deficiency anemia, unspecified: Secondary | ICD-10-CM

## 2022-08-09 DIAGNOSIS — Z5112 Encounter for antineoplastic immunotherapy: Secondary | ICD-10-CM | POA: Diagnosis not present

## 2022-08-09 LAB — CBC WITH DIFFERENTIAL (CANCER CENTER ONLY)
Abs Immature Granulocytes: 0.04 10*3/uL (ref 0.00–0.07)
Basophils Absolute: 0 10*3/uL (ref 0.0–0.1)
Basophils Relative: 1 %
Eosinophils Absolute: 0.1 10*3/uL (ref 0.0–0.5)
Eosinophils Relative: 3 %
HCT: 29.3 % — ABNORMAL LOW (ref 36.0–46.0)
Hemoglobin: 9.2 g/dL — ABNORMAL LOW (ref 12.0–15.0)
Immature Granulocytes: 1 %
Lymphocytes Relative: 46 %
Lymphs Abs: 1.8 10*3/uL (ref 0.7–4.0)
MCH: 24.4 pg — ABNORMAL LOW (ref 26.0–34.0)
MCHC: 31.4 g/dL (ref 30.0–36.0)
MCV: 77.7 fL — ABNORMAL LOW (ref 80.0–100.0)
Monocytes Absolute: 0.1 10*3/uL (ref 0.1–1.0)
Monocytes Relative: 3 %
Neutro Abs: 1.8 10*3/uL (ref 1.7–7.7)
Neutrophils Relative %: 46 %
Platelet Count: 186 10*3/uL (ref 150–400)
RBC: 3.77 MIL/uL — ABNORMAL LOW (ref 3.87–5.11)
RDW: 18 % — ABNORMAL HIGH (ref 11.5–15.5)
WBC Count: 3.9 10*3/uL — ABNORMAL LOW (ref 4.0–10.5)
nRBC: 0 % (ref 0.0–0.2)

## 2022-08-09 LAB — CMP (CANCER CENTER ONLY)
ALT: 10 U/L (ref 0–44)
AST: 11 U/L — ABNORMAL LOW (ref 15–41)
Albumin: 4.1 g/dL (ref 3.5–5.0)
Alkaline Phosphatase: 61 U/L (ref 38–126)
Anion gap: 3 — ABNORMAL LOW (ref 5–15)
BUN: 13 mg/dL (ref 6–20)
CO2: 31 mmol/L (ref 22–32)
Calcium: 9.6 mg/dL (ref 8.9–10.3)
Chloride: 105 mmol/L (ref 98–111)
Creatinine: 0.75 mg/dL (ref 0.44–1.00)
GFR, Estimated: >60 mL/min (ref >60)
Glucose, Bld: 89 mg/dL (ref 70–99)
Potassium: 3.9 mmol/L (ref 3.5–5.1)
Sodium: 139 mmol/L (ref 135–145)
Total Bilirubin: 0.5 mg/dL (ref 0.3–1.2)
Total Protein: 7 g/dL (ref 6.5–8.1)

## 2022-08-09 MED ORDER — DIPHENHYDRAMINE HCL 50 MG/ML IJ SOLN
50.0000 mg | Freq: Once | INTRAMUSCULAR | Status: AC
  Administered 2022-08-09: 50 mg via INTRAVENOUS
  Filled 2022-08-09: qty 1

## 2022-08-09 MED ORDER — SODIUM CHLORIDE 0.9% FLUSH
10.0000 mL | INTRAVENOUS | Status: DC | PRN
  Administered 2022-08-09: 10 mL

## 2022-08-09 MED ORDER — SODIUM CHLORIDE 0.9 % IV SOLN
80.0000 mg/m2 | Freq: Once | INTRAVENOUS | Status: AC
  Administered 2022-08-09: 138 mg via INTRAVENOUS
  Filled 2022-08-09: qty 23

## 2022-08-09 MED ORDER — HEPARIN SOD (PORK) LOCK FLUSH 100 UNIT/ML IV SOLN
500.0000 [IU] | Freq: Once | INTRAVENOUS | Status: AC | PRN
  Administered 2022-08-09: 500 [IU]

## 2022-08-09 MED ORDER — TRASTUZUMAB-ANNS CHEMO 150 MG IV SOLR
2.0000 mg/kg | Freq: Once | INTRAVENOUS | Status: AC
  Administered 2022-08-09: 126 mg via INTRAVENOUS
  Filled 2022-08-09: qty 6

## 2022-08-09 MED ORDER — SODIUM CHLORIDE 0.9% FLUSH
10.0000 mL | Freq: Once | INTRAVENOUS | Status: AC
  Administered 2022-08-09: 10 mL

## 2022-08-09 MED ORDER — SODIUM CHLORIDE 0.9 % IV SOLN: Freq: Once | INTRAVENOUS | Status: AC

## 2022-08-09 MED ORDER — SODIUM CHLORIDE 0.9 % IV SOLN
10.0000 mg | Freq: Once | INTRAVENOUS | Status: AC
  Administered 2022-08-09: 10 mg via INTRAVENOUS
  Filled 2022-08-09: qty 10

## 2022-08-09 MED ORDER — ACETAMINOPHEN 325 MG PO TABS
650.0000 mg | ORAL_TABLET | Freq: Once | ORAL | Status: AC
  Administered 2022-08-09: 650 mg via ORAL
  Filled 2022-08-09: qty 2

## 2022-08-09 MED ORDER — FAMOTIDINE IN NACL 20-0.9 MG/50ML-% IV SOLN
20.0000 mg | Freq: Once | INTRAVENOUS | Status: AC
  Administered 2022-08-09: 20 mg via INTRAVENOUS
  Filled 2022-08-09: qty 50

## 2022-08-09 NOTE — Progress Notes (Signed)
[place] NOTE  Patient Care Team: Martinique, [name], MD as PCP - General (Family Medicine) Mauro Kaufmann, RN as Oncology Nurse Navigator Rockwell Germany, RN as Oncology Nurse Navigator Benay Pike, MD as Consulting Physician (Hematology and Oncology) Stark Klein, MD as Consulting Physician (General Surgery) Eppie Gibson, MD as Attending Physician (Radiation Oncology)  CHIEF COMPLAINTS/PURPOSE OF CONSULTATION:  Breast cancer follow up.  HISTORY OF PRESENTING ILLNESS:  Dawn Bates 52 y.o. female is here because of recent diagnosis of right breast cancer  I reviewed her records extensively and collaborated the history with the patient.  SUMMARY OF ONCOLOGIC HISTORY: Oncology History  Malignant neoplasm of upper-outer quadrant of right breast in female, estrogen receptor positive ([place])  05/26/2022 Mammogram   Diagnostic mammogram after patient noticed a palpable lump in the right breast showed suspicious mass at the palpable site of concern in the right breast at 10:00 measuring 1.2 cm. Ultrasound of the right breast showed no evidence of lymphadenopathy.   06/06/2022 Pathology Results   Right breast needle core biopsy at 10:00 showed overall grade 2 invasive ductal carcinoma.  Prognostic showed ER 100% positive strong staining PR 5% positive strong staining, Ki-67 of 50% HER2 3+ by [facility]   06/06/2022 Cancer Staging   Staging form: Breast, AJCC 8th Edition - Clinical stage from 06/06/2022: Stage IA (cT1c, cN0, cM0, G3, ER+, PR+, HER2+) - Signed by Benay Pike, MD on 07/19/2022 Stage prefix: Initial diagnosis Histologic grading system: 3 grade system   06/26/2022 Genetic Testing   Negative [facility] CancerNext Expanded Panel.  Report date is 06/26/2022.   The CancerNext-Expanded gene panel offered by [employer] and includes sequencing and rearrangement analysis for the following 77 genes: AIP, ALK, APC, ATM, AXIN2, BAP1, BARD1, BLM, BMPR1A,  BRCA1, BRCA2, BRIP1, CDC73, CDH1, CDK4, CDKN1B, CDKN2A, CHEK2, CTNNA1, DICER1, FANCC, FH, FLCN, GALNT12, KIF1B, LZTR1, MAX, MEN1, MET, MLH1, MSH2, MSH3, MSH6, MUTYH, NBN, NF1, NF2, NTHL1, PALB2, PHOX2B, PMS2, POT1, PRKAR1A, PTCH1, PTEN, RAD51C, RAD51D, RB1, RECQL, RET, SDHA, SDHAF2, SDHB, SDHC, SDHD, SMAD4, SMARCA4, SMARCB1, SMARCE1, STK11, SUFU, TMEM127, TP53, TSC1, TSC2, VHL and XRCC2 (sequencing and deletion/duplication); EGFR, EGLN1, HOXB13, KIT, MITF, PDGFRA, POLD1, and POLE (sequencing only); EPCAM and GREM1 (deletion/duplication only).    06/28/2022 Initial Diagnosis   Malignant neoplasm of upper-outer quadrant of right breast in female, estrogen receptor positive ([place])   07/05/2022 Surgery   Right breast lumpectomy: 1.3 cm invasive poorly differentiated ductal carcinoma grade 3, margins negative, 2 sentinel lymph nodes negative   07/05/2022 Cancer Staging   Staging form: Breast, AJCC 8th Edition - Pathologic stage from 07/05/2022: Stage IA (pT1c, pN0, cM0, G3, ER+, PR+, HER2+) - Signed by Gardenia Phlegm, NP on 08/02/2022 Stage prefix: Initial diagnosis Histologic grading system: 3 grade system   08/02/2022 -  Chemotherapy   Patient is on Treatment Plan : BREAST Paclitaxel + Trastuzumab q7d / Trastuzumab q21d       Patient arrived to the appointment today with her husband. Since last visit she had right breast lumpectomy with pathology showing invasive poorly differentiated adenocarcinoma with lymphoid stroma grade 3, tumor measures 1.3 x 1.2 x 1.0 cm, margins free 2 lymph nodes negative for carcinoma ER 100% strong staining PR 5% strong staining HER2 3+ by IHC and Ki-67 of 50%.  2 out of 2 sentinel lymph nodes are negative for involvement She is now on adjuvant TH. She tolerated this very well overall except for some cramping, loose stool She says she  missed the appt with RD, would like them to still call her if possible. No other adverse effects. No neuropathy  Rest of the  pertinent 10 point ROS reviewed and negative.  MEDICAL HISTORY:  Past Medical History:  Diagnosis Date   Abnormal uterine bleeding 2022   Anemia 02/12/2021   Patient is taking iron supplementation.   Cancer [facility])    breast   COVID-19 2020   cold-like symptoms   Endometriosis    Fibroids    Graves disease    Hypertension    Hyperthyroidism 2022   s/p Nuclear Medicine RAI therapy on 08/07/20   Seasonal allergies    severe chronic allergies, runny nose, congestion, cough when she lays down due to drainage   Vitamin D deficiency 2021   Wears glasses     SURGICAL HISTORY: Past Surgical History:  Procedure Laterality Date   BREAST BIOPSY Right 06/06/2022   [place] RT BREAST BX W LOC DEV 1ST LESION IMG BX SPEC US GUIDE 06/06/2022 [facility] MAMMOGRAPHY   BREAST LUMPECTOMY WITH SENTINEL LYMPH NODE BIOPSY Right 07/05/2022   Procedure: RIGHT BREAST LUMPECTOMY WITH SENTINEL LYMPH NODE BX;  Surgeon: Stark Klein, MD;  Location: [place];  Service: General;  Laterality: Right;   CYSTOSCOPY  10/04/2021   Procedure: CYSTOSCOPY;  Surgeon: Crawford Givens, MD;  Location: [place];  Service: Gynecology;;   LAPAROSCOPIC VAGINAL HYSTERECTOMY WITH SALPINGECTOMY N/A 10/04/2021   Procedure: LAPAROSCOPIC ASSISTED VAGINAL HYSTERECTOMY WITH SALPINGECTOMY;  Surgeon: Crawford Givens, MD;  Location: [place];  Service: Gynecology;  Laterality: N/A;   PORTACATH PLACEMENT Left 07/05/2022   Procedure: INSERTION PORT-A-CATH;  Surgeon: Stark Klein, MD;  Location: [place];  Service: General;  Laterality: Left;   TUBAL LIGATION  09/2008    SOCIAL HISTORY: Social History   Socioeconomic History   Marital status: Married    Spouse name: Not on file   Number of children: Not on file   Years of education: Not on file   Highest education level: Not on file  Occupational History   Not on file  Tobacco Use   Smoking status: Never   Smokeless  tobacco: Never  Vaping Use   Vaping Use: Never used  Substance and Sexual Activity   Alcohol use: No   Drug use: No   Sexual activity: Yes    Partners: Male    Birth control/protection: Surgical    Comment: BTL   Other Topics Concern   Not on file  Social History Narrative   Not on file   Social Determinants of Health   Financial Resource Strain: Not on file  Food Insecurity: Not on file  Transportation Needs: Not on file  Physical Activity: Not on file  Stress: Not on file  Social Connections: Not on file  Intimate Partner Violence: Not on file    FAMILY HISTORY: Family History  Problem Relation Age of Onset   Breast cancer Sister 85       triple negative; d. early 53s   Cancer Paternal Aunt        unknown type   Thyroid cancer Half-Sister        mat half sister; dx 53s; unknown cell type   Maternal uncle with prostate cancer, multiple myeloma.  ALLERGIES:  is allergic to latex.  MEDICATIONS:  Current Outpatient Medications  Medication Sig Dispense Refill   amLODipine (NORVASC) 5 MG tablet TAKE 1 TABLET (5 MG TOTAL) BY MOUTH DAILY. 90 tablet 1  baclofen (LIORESAL) 10 MG tablet Take 10 mg by mouth 3 (three) times daily.     lidocaine-prilocaine (EMLA) cream Apply to affected area once 30 g 3   ondansetron (ZOFRAN) 8 MG tablet Take 1 tablet (8 mg total) by mouth every 8 (eight) hours as needed for nausea or vomiting. 30 tablet 1   oxyCODONE (OXY IR/ROXICODONE) 5 MG immediate release tablet Take 1 tablet (5 mg total) by mouth every 6 (six) hours as needed for severe pain. 8 tablet 0   prochlorperazine (COMPAZINE) 10 MG tablet Take 1 tablet (10 mg total) by mouth every 6 (six) hours as needed for nausea or vomiting. 30 tablet 1   valACYclovir (VALTREX) 500 MG tablet Take 500 mg by mouth as needed.     Vitamin D, Ergocalciferol, (DRISDOL) 1.25 MG (50000 UNIT) CAPS capsule Take 1 capsule (50,000 Units total) by mouth every 7 (seven) days. 12 capsule 0   No current  facility-administered medications for this visit.    REVIEW OF SYSTEMS:   Constitutional: Denies fevers, chills or abnormal night sweats Eyes: Denies blurriness of vision, double vision or watery eyes Ears, nose, mouth, throat, and face: Denies mucositis or sore throat Respiratory: Denies cough, dyspnea or wheezes Cardiovascular: Denies palpitation, chest discomfort or lower extremity swelling Gastrointestinal:  Denies nausea, heartburn or change in bowel habits Skin: Denies abnormal skin rashes Lymphatics: Denies new lymphadenopathy or easy bruising Neurological:Denies numbness, tingling or new weaknesses Behavioral/Psych: Mood is stable, no new changes  Breast:  Denies any palpable lumps or discharge All other systems were reviewed with the patient and are negative.  PHYSICAL EXAMINATION: ECOG PERFORMANCE STATUS: 0 - Asymptomatic VS not done.  Physical Exam Constitutional:      Appearance: Normal appearance.  Cardiovascular:     Rate and Rhythm: Normal rate and regular rhythm.  Musculoskeletal:        General: Normal range of motion.     Cervical back: Normal range of motion and neck supple. No rigidity.  Lymphadenopathy:     Cervical: No cervical adenopathy.  Skin:    General: Skin is warm and dry.  Neurological:     Mental Status: She is alert.  Psychiatric:        Mood and Affect: Mood normal.      LABORATORY DATA:  I have reviewed the data as listed Lab Results  Component Value Date   WBC 3.9 (L) 08/09/2022   HGB 9.2 (L) 08/09/2022   HCT 29.3 (L) 08/09/2022   MCV 77.7 (L) 08/09/2022   PLT 186 08/09/2022   Lab Results  Component Value Date   NA 139 08/02/2022   K 3.7 08/02/2022   CL 106 08/02/2022   CO2 29 08/02/2022    RADIOGRAPHIC STUDIES: I have personally reviewed the radiological reports and agreed with the findings in the report.  ASSESSMENT AND PLAN:  Malignant neoplasm of upper-outer quadrant of right breast in female, estrogen receptor  positive ([place]) This is a very pleasant 52 year old premenopausal female patient with newly diagnosed right breast invasive ductal carcinoma T1 N0 M0 ER/PR and HER2 amplified referred to medical oncology for recommendations.  Given triple positive disease, we have discussed about considering adjuvant chemotherapy.  She now here after surgery, final pathology showed tumor measuring 1.3 x 1.2 x 1.0 cm, negative margins, negative lymph nodes.  I have today once again discussed about considering adjuvant Taxol and Herceptin followed by Herceptin maintenance for a whole year.   She will be a candidate for adjuvant  antiestrogen therapy after completing adjuvant radiation. She is now here before second weekly cycle of TH. She is tolerating this very well overall. No concerns on exam today Ok to proceed if labs are within parameters  # 2 IDA, recommend oral iron every day or every other day. If she didn't respond to oral iron in the next few weeks, we will consider IV iron. She is agreeable.  Benay Pike MD   I connected with  Chauntay A Laster on 08/09/22 by a telephone application and verified that I am speaking with the correct person using two identifiers.   I discussed the limitations of evaluation and management by telemedicine. The patient expressed understanding and agreed to proceed.   Total time spent: 30 minutes including history, physical exam, review of records, counseling and coordination of care All questions were answered. The patient knows to call the clinic with any problems, questions or concerns.    Benay Pike, MD 08/09/22

## 2022-08-09 NOTE — Progress Notes (Signed)
Nutrition Assessment  Reason for Assessment: MD referral  ASSESSMENT: 52 year old female with stage IA breast cancer of right breast, estrogen receptor positive. She is currently receiving chemotherapy with taxol + kanjinti q21d (first 2/6). Patient is under the care of Dr. Chryl Heck  Past medical history includes HTN, hypothyroidism, HLD, vit D deficiency  Met with patient and husband during infusion. Patient is sleepy s/p IV Benadryl. Nutrition history provided by husband. Patient tolerated first treatment well. She had one episode of diarrhea. Husband shares he is concerned about weight loss since starting treatment. He reports patient has a good appetite. She is eating smaller more frequent meals. Husband says patient eating a lot of fruit that does not give her calories. She has stopped eating blueberries as she has read these are not good for estrogen positive cancers. Husband asking types of foods are best for her to have.   Nutrition Focused Physical Exam: deferred    Medications: Norvasc, zofran, oxycodone, valtrex, drisdol   Labs: reviewed    Anthropometrics: Weights have decreased 8.7% (13 lbs) from usual weight in 4 months - significant for time frame  Height: 5'6" Weight: 136 lb 12.8 oz (today) UBW: 149 lb 9.6 oz  (October 2023) BMI: 22.08  2/13 - 140 lb 8 oz 1/30 - 138 lb 12.8 oz 1/16 - 136 lb 11 oz    NUTRITION DIAGNOSIS: Food and nutrition related knowledge deficit related to newly diagnosed breast cancer undergoing chemotherapy as evidenced by no prior need for associated nutrition education.    INTERVENTION:  Educated on the importance of adequate calorie and protein energy intake to maintain strength/weights during treatment Encouraged small frequent meals and snacks. Discussed foods with protein and encouraged protein source with all meals - handout with snack ideas and high protein foods provided Educated to eat a variety of fruits and vegetables. Encouraged  patient and husband to defer to [email or web address] for evidenced based information/recommendations  Contact information provided    MONITORING, EVALUATION, GOAL: Patient will tolerate increased calories and protein to minimize weight loss during treatment   Next Visit: Tuesday March 19 during infusion

## 2022-08-09 NOTE — Assessment & Plan Note (Addendum)
This is a very pleasant 52 year old premenopausal female patient with newly diagnosed right breast invasive ductal carcinoma T1 N0 M0 ER/PR and HER2 amplified referred to medical oncology for recommendations.  Given triple positive disease, we have discussed about considering adjuvant chemotherapy.  She now here after surgery, final pathology showed tumor measuring 1.3 x 1.2 x 1.0 cm, negative margins, negative lymph nodes.  I have today once again discussed about considering adjuvant Taxol and Herceptin followed by Herceptin maintenance for a whole year.   She will be a candidate for adjuvant antiestrogen therapy after completing adjuvant radiation. She is now here before second weekly cycle of TH. She is tolerating this very well overall. No concerns on exam today Ok to proceed if labs are within parameters  # 2 IDA, recommend oral iron every day or every other day. If she didn't respond to oral iron in the next few weeks, we will consider IV iron. She is agreeable.  Benay Pike MD

## 2022-08-09 NOTE — Patient Instructions (Signed)
[facility]  Discharge Instructions: Thank you for choosing [facility] to provide your oncology and hematology care.   If you have a lab appointment with the [facility], please go directly to the [facility] and check in at the registration area.   Wear comfortable clothing and clothing appropriate for easy access to any Portacath or PICC line.   We strive to give you quality time with your provider. You may need to reschedule your appointment if you arrive late (15 or more minutes).  Arriving late affects you and other patients whose appointments are after yours.  Also, if you miss three or more appointments without notifying the office, you may be dismissed from the clinic at the provider's discretion.      For prescription refill requests, have your pharmacy contact our office and allow 72 hours for refills to be completed.    Today you received the following chemotherapy and/or immunotherapy agents: Kinjinti, Taxol      To help prevent nausea and vomiting after your treatment, we encourage you to take your nausea medication as directed.  BELOW ARE SYMPTOMS THAT SHOULD BE REPORTED IMMEDIATELY: *FEVER GREATER THAN 100.4 F (38 C) OR HIGHER *CHILLS OR SWEATING *NAUSEA AND VOMITING THAT IS NOT CONTROLLED WITH YOUR NAUSEA MEDICATION *UNUSUAL SHORTNESS OF BREATH *UNUSUAL BRUISING OR BLEEDING *URINARY PROBLEMS (pain or burning when urinating, or frequent urination) *BOWEL PROBLEMS (unusual diarrhea, constipation, pain near the anus) TENDERNESS IN MOUTH AND THROAT WITH OR WITHOUT PRESENCE OF ULCERS (sore throat, sores in mouth, or a toothache) UNUSUAL RASH, SWELLING OR PAIN  UNUSUAL VAGINAL DISCHARGE OR ITCHING   Items with * indicate a potential emergency and should be followed up as soon as possible or go to the Emergency Department if any problems should occur.  Please show the CHEMOTHERAPY ALERT CARD or IMMUNOTHERAPY ALERT CARD at  check-in to the Emergency Department and triage nurse.  Should you have questions after your visit or need to cancel or reschedule your appointment, please contact [facility]  Dept: [phone]  and follow the prompts.  Office hours are 8:00 a.m. to 4:30 p.m. Monday - Friday. Please note that voicemails left after 4:00 p.m. may not be returned until the following business day.  We are closed weekends and major holidays. You have access to a nurse at all times for urgent questions. Please call the main number to the clinic Dept: [phone] and follow the prompts.   For any non-urgent questions, you may also contact your provider using MyChart. We now offer e-Visits for anyone 25 and older to request care online for non-urgent symptoms. For details visit [email or web address].   Also download the MyChart app! Go to the app store, search "MyChart", open the app, select [facility], and log in with your MyChart username and password.  Trastuzumab Injection What is this medication? TRASTUZUMAB (tras TOO zoo mab) treats breast cancer and stomach cancer. It works by blocking a protein that causes cancer cells to grow and multiply. This helps to slow or stop the spread of cancer cells. This medicine may be used for other purposes; ask your health care provider or pharmacist if you have questions. COMMON BRAND NAME(S): Herceptin, Janae Bridgeman, Ontruzant, Trazimera What should I tell my care team before I take this medication? They need to know if you have any of these conditions: Heart failure Lung disease An unusual or allergic reaction to trastuzumab, other  medications, foods, dyes, or preservatives Pregnant or trying to get pregnant Breast-feeding How should I use this medication? This medication is injected into a vein. It is given by your care team in a hospital or clinic setting. Talk to your care team about the use of this medication in  children. It is not approved for use in children. Overdosage: If you think you have taken too much of this medicine contact a poison control center or emergency room at once. NOTE: This medicine is only for you. Do not share this medicine with others. What if I miss a dose? Keep appointments for follow-up doses. It is important not to miss your dose. Call your care team if you are unable to keep an appointment. What may interact with this medication? Certain types of chemotherapy, such as daunorubicin, doxorubicin, epirubicin, idarubicin This list may not describe all possible interactions. Give your health care provider a list of all the medicines, herbs, non-prescription drugs, or dietary supplements you use. Also tell them if you smoke, drink alcohol, or use illegal drugs. Some items may interact with your medicine. What should I watch for while using this medication? Your condition will be monitored carefully while you are receiving this medication. This medication may make you feel generally unwell. This is not uncommon, as chemotherapy affects healthy cells as well as cancer cells. Report any side effects. Continue your course of treatment even though you feel ill unless your care team tells you to stop. This medication may increase your risk of getting an infection. Call your care team for advice if you get a fever, chills, sore throat, or other symptoms of a cold or flu. Do not treat yourself. Try to avoid being around people who are sick. Avoid taking medications that contain aspirin, acetaminophen, ibuprofen, naproxen, or ketoprofen unless instructed by your care team. These medications can hide a fever. Talk to your care team if you may be pregnant. Serious birth defects can occur if you take this medication during pregnancy and for 7 months after the last dose. You will need a negative pregnancy test before starting this medication. Contraception is recommended while taking this medication  and for 7 months after the last dose. Your care team can help you find the option that works for you. Do not breastfeed while taking this medication and for 7 months after stopping treatment. What side effects may I notice from receiving this medication? Side effects that you should report to your care team as soon as possible: Allergic reactions or angioedema--skin rash, itching or hives, swelling of the face, eyes, lips, tongue, arms, or legs, trouble swallowing or breathing Dry cough, shortness of breath or trouble breathing Heart failure--shortness of breath, swelling of the ankles, feet, or hands, sudden weight gain, unusual weakness or fatigue Infection--fever, chills, cough, or sore throat Infusion reactions--chest pain, shortness of breath or trouble breathing, feeling faint or lightheaded Side effects that usually do not require medical attention (report to your care team if they continue or are bothersome): Diarrhea Dizziness Headache Nausea Trouble sleeping Vomiting This list may not describe all possible side effects. Call your doctor for medical advice about side effects. You may report side effects to FDA at 1-800-FDA-1088. Where should I keep my medication? This medication is given in a hospital or clinic. It will not be stored at home. NOTE: This sheet is a summary. It may not cover all possible information. If you have questions about this medicine, talk to your doctor, pharmacist, or health  care provider.  2023 Elsevier/Gold Standard (2021-10-07 00:00:00)  Paclitaxel Injection What is this medication? PACLITAXEL (PAK li TAX el) treats some types of cancer. It works by slowing down the growth of cancer cells. This medicine may be used for other purposes; ask your health care provider or pharmacist if you have questions. COMMON BRAND NAME(S): Onxol, Taxol What should I tell my care team before I take this medication? They need to know if you have any of these  conditions: Heart disease Liver disease Low white blood cell levels An unusual or allergic reaction to paclitaxel, other medications, foods, dyes, or preservatives If you or your partner are pregnant or trying to get pregnant Breast-feeding How should I use this medication? This medication is injected into a vein. It is given by your care team in a hospital or clinic setting. Talk to your care team about the use of this medication in children. While it may be given to children for selected conditions, precautions do apply. Overdosage: If you think you have taken too much of this medicine contact a poison control center or emergency room at once. NOTE: This medicine is only for you. Do not share this medicine with others. What if I miss a dose? Keep appointments for follow-up doses. It is important not to miss your dose. Call your care team if you are unable to keep an appointment. What may interact with this medication? Do not take this medication with any of the following: Live virus vaccines Other medications may affect the way this medication works. Talk with your care team about all of the medications you take. They may suggest changes to your treatment plan to lower the risk of side effects and to make sure your medications work as intended. This list may not describe all possible interactions. Give your health care provider a list of all the medicines, herbs, non-prescription drugs, or dietary supplements you use. Also tell them if you smoke, drink alcohol, or use illegal drugs. Some items may interact with your medicine. What should I watch for while using this medication? Your condition will be monitored carefully while you are receiving this medication. You may need blood work while taking this medication. This medication may make you feel generally unwell. This is not uncommon as chemotherapy can affect healthy cells as well as cancer cells. Report any side effects. Continue your  course of treatment even though you feel ill unless your care team tells you to stop. This medication can cause serious allergic reactions. To reduce the risk, your care team may give you other medications to take before receiving this one. Be sure to follow the directions from your care team. This medication may increase your risk of getting an infection. Call your care team for advice if you get a fever, chills, sore throat, or other symptoms of a cold or flu. Do not treat yourself. Try to avoid being around people who are sick. This medication may increase your risk to bruise or bleed. Call your care team if you notice any unusual bleeding. Be careful brushing or flossing your teeth or using a toothpick because you may get an infection or bleed more easily. If you have any dental work done, tell your dentist you are receiving this medication. Talk to your care team if you may be pregnant. Serious birth defects can occur if you take this medication during pregnancy. Talk to your care team before breastfeeding. Changes to your treatment plan may be needed. What side effects may  I notice from receiving this medication? Side effects that you should report to your care team as soon as possible: Allergic reactions--skin rash, itching, hives, swelling of the face, lips, tongue, or throat Heart rhythm changes--fast or irregular heartbeat, dizziness, feeling faint or lightheaded, chest pain, trouble breathing Increase in blood pressure Infection--fever, chills, cough, sore throat, wounds that don't heal, pain or trouble when passing urine, general feeling of discomfort or being unwell Low blood pressure--dizziness, feeling faint or lightheaded, blurry vision Low red blood cell level--unusual weakness or fatigue, dizziness, headache, trouble breathing Painful swelling, warmth, or redness of the skin, blisters or sores at the infusion site Pain, tingling, or numbness in the hands or feet Slow  heartbeat--dizziness, feeling faint or lightheaded, confusion, trouble breathing, unusual weakness or fatigue Unusual bruising or bleeding Side effects that usually do not require medical attention (report to your care team if they continue or are bothersome): Diarrhea Hair loss Joint pain Loss of appetite Muscle pain Nausea Vomiting This list may not describe all possible side effects. Call your doctor for medical advice about side effects. You may report side effects to FDA at 1-800-FDA-1088. Where should I keep my medication? This medication is given in a hospital or clinic. It will not be stored at home. NOTE: This sheet is a summary. It may not cover all possible information. If you have questions about this medicine, talk to your doctor, pharmacist, or health care provider.  2023 Elsevier/Gold Standard (2021-10-06 00:00:00)

## 2022-08-09 NOTE — Progress Notes (Signed)
Pt is approved for the $1000 [employer] grant.  

## 2022-08-12 ENCOUNTER — Other Ambulatory Visit: Payer: Self-pay

## 2022-08-12 DIAGNOSIS — C50411 Malignant neoplasm of upper-outer quadrant of right female breast: Secondary | ICD-10-CM

## 2022-08-13 ENCOUNTER — Other Ambulatory Visit: Payer: Self-pay

## 2022-08-14 ENCOUNTER — Other Ambulatory Visit: Payer: Self-pay

## 2022-08-16 ENCOUNTER — Inpatient Hospital Stay: Payer: BC Managed Care – PPO

## 2022-08-16 ENCOUNTER — Other Ambulatory Visit: Payer: Self-pay

## 2022-08-16 ENCOUNTER — Other Ambulatory Visit: Payer: Self-pay | Admitting: *Deleted

## 2022-08-16 ENCOUNTER — Inpatient Hospital Stay: Payer: BC Managed Care – PPO | Admitting: Adult Health

## 2022-08-16 ENCOUNTER — Telehealth: Payer: Self-pay | Admitting: *Deleted

## 2022-08-16 VITALS — BP 151/96 | HR 68 | Temp 98.4°F | Resp 18 | Wt 138.1 lb

## 2022-08-16 DIAGNOSIS — Z170 Estrogen receptor positive status [ER+]: Secondary | ICD-10-CM

## 2022-08-16 DIAGNOSIS — Z5112 Encounter for antineoplastic immunotherapy: Secondary | ICD-10-CM | POA: Diagnosis not present

## 2022-08-16 DIAGNOSIS — Z95828 Presence of other vascular implants and grafts: Secondary | ICD-10-CM

## 2022-08-16 LAB — CBC WITH DIFFERENTIAL (CANCER CENTER ONLY)
Abs Immature Granulocytes: 0.01 10*3/uL (ref 0.00–0.07)
Basophils Absolute: 0 10*3/uL (ref 0.0–0.1)
Basophils Relative: 1 %
Eosinophils Absolute: 0.1 10*3/uL (ref 0.0–0.5)
Eosinophils Relative: 2 %
HCT: 29.6 % — ABNORMAL LOW (ref 36.0–46.0)
Hemoglobin: 9.3 g/dL — ABNORMAL LOW (ref 12.0–15.0)
Immature Granulocytes: 0 %
Lymphocytes Relative: 54 %
Lymphs Abs: 1.7 10*3/uL (ref 0.7–4.0)
MCH: 24.7 pg — ABNORMAL LOW (ref 26.0–34.0)
MCHC: 31.4 g/dL (ref 30.0–36.0)
MCV: 78.7 fL — ABNORMAL LOW (ref 80.0–100.0)
Monocytes Absolute: 0.1 10*3/uL (ref 0.1–1.0)
Monocytes Relative: 3 %
Neutro Abs: 1.3 10*3/uL — ABNORMAL LOW (ref 1.7–7.7)
Neutrophils Relative %: 40 %
Platelet Count: 212 10*3/uL (ref 150–400)
RBC: 3.76 MIL/uL — ABNORMAL LOW (ref 3.87–5.11)
RDW: 19 % — ABNORMAL HIGH (ref 11.5–15.5)
WBC Count: 3.2 10*3/uL — ABNORMAL LOW (ref 4.0–10.5)
nRBC: 0 % (ref 0.0–0.2)

## 2022-08-16 LAB — CMP (CANCER CENTER ONLY)
ALT: 10 U/L (ref 0–44)
AST: 11 U/L — ABNORMAL LOW (ref 15–41)
Albumin: 4.1 g/dL (ref 3.5–5.0)
Alkaline Phosphatase: 56 U/L (ref 38–126)
Anion gap: 4 — ABNORMAL LOW (ref 5–15)
BUN: 10 mg/dL (ref 6–20)
CO2: 30 mmol/L (ref 22–32)
Calcium: 9.1 mg/dL (ref 8.9–10.3)
Chloride: 105 mmol/L (ref 98–111)
Creatinine: 0.71 mg/dL (ref 0.44–1.00)
GFR, Estimated: >60 mL/min (ref >60)
Glucose, Bld: 88 mg/dL (ref 70–99)
Potassium: 3.8 mmol/L (ref 3.5–5.1)
Sodium: 139 mmol/L (ref 135–145)
Total Bilirubin: 0.5 mg/dL (ref 0.3–1.2)
Total Protein: 6.9 g/dL (ref 6.5–8.1)

## 2022-08-16 MED ORDER — SODIUM CHLORIDE 0.9% FLUSH
10.0000 mL | Freq: Once | INTRAVENOUS | Status: AC
  Administered 2022-08-16: 10 mL

## 2022-08-16 MED ORDER — DIPHENHYDRAMINE HCL 50 MG/ML IJ SOLN
50.0000 mg | Freq: Once | INTRAMUSCULAR | Status: AC
  Administered 2022-08-16: 50 mg via INTRAVENOUS
  Filled 2022-08-16: qty 1

## 2022-08-16 MED ORDER — SODIUM CHLORIDE 0.9 % IV SOLN: Freq: Once | INTRAVENOUS | Status: AC

## 2022-08-16 MED ORDER — TRASTUZUMAB-ANNS CHEMO 150 MG IV SOLR
2.0000 mg/kg | Freq: Once | INTRAVENOUS | Status: AC
  Administered 2022-08-16: 126 mg via INTRAVENOUS
  Filled 2022-08-16: qty 6

## 2022-08-16 MED ORDER — SODIUM CHLORIDE 0.9 % IV SOLN
80.0000 mg/m2 | Freq: Once | INTRAVENOUS | Status: AC
  Administered 2022-08-16: 138 mg via INTRAVENOUS
  Filled 2022-08-16: qty 23

## 2022-08-16 MED ORDER — SODIUM CHLORIDE 0.9 % IV SOLN
10.0000 mg | Freq: Once | INTRAVENOUS | Status: AC
  Administered 2022-08-16: 10 mg via INTRAVENOUS
  Filled 2022-08-16: qty 10

## 2022-08-16 MED ORDER — SODIUM CHLORIDE 0.9% FLUSH
10.0000 mL | INTRAVENOUS | Status: DC | PRN
  Administered 2022-08-16: 10 mL

## 2022-08-16 MED ORDER — HEPARIN SOD (PORK) LOCK FLUSH 100 UNIT/ML IV SOLN
500.0000 [IU] | Freq: Once | INTRAVENOUS | Status: AC | PRN
  Administered 2022-08-16: 500 [IU]

## 2022-08-16 MED ORDER — ACETAMINOPHEN 325 MG PO TABS
650.0000 mg | ORAL_TABLET | Freq: Once | ORAL | Status: AC
  Administered 2022-08-16: 650 mg via ORAL
  Filled 2022-08-16: qty 2

## 2022-08-16 MED ORDER — FAMOTIDINE IN NACL 20-0.9 MG/50ML-% IV SOLN
20.0000 mg | Freq: Once | INTRAVENOUS | Status: AC
  Administered 2022-08-16: 20 mg via INTRAVENOUS
  Filled 2022-08-16: qty 50

## 2022-08-16 NOTE — Telephone Encounter (Signed)
Per [place] of 1.3 MD gave ok to proceed with treatment.

## 2022-08-16 NOTE — Patient Instructions (Signed)
[facility]  Discharge Instructions: Thank you for choosing [facility] to provide your oncology and hematology care.   If you have a lab appointment with the [facility], please go directly to the [facility] and check in at the registration area.   Wear comfortable clothing and clothing appropriate for easy access to any Portacath or PICC line.   We strive to give you quality time with your provider. You may need to reschedule your appointment if you arrive late (15 or more minutes).  Arriving late affects you and other patients whose appointments are after yours.  Also, if you miss three or more appointments without notifying the office, you may be dismissed from the clinic at the provider's discretion.      For prescription refill requests, have your pharmacy contact our office and allow 72 hours for refills to be completed.    Today you received the following chemotherapy and/or immunotherapy agents trastuzumab, paclitaxel      To help prevent nausea and vomiting after your treatment, we encourage you to take your nausea medication as directed.  BELOW ARE SYMPTOMS THAT SHOULD BE REPORTED IMMEDIATELY: *FEVER GREATER THAN 100.4 F (38 C) OR HIGHER *CHILLS OR SWEATING *NAUSEA AND VOMITING THAT IS NOT CONTROLLED WITH YOUR NAUSEA MEDICATION *UNUSUAL SHORTNESS OF BREATH *UNUSUAL BRUISING OR BLEEDING *URINARY PROBLEMS (pain or burning when urinating, or frequent urination) *BOWEL PROBLEMS (unusual diarrhea, constipation, pain near the anus) TENDERNESS IN MOUTH AND THROAT WITH OR WITHOUT PRESENCE OF ULCERS (sore throat, sores in mouth, or a toothache) UNUSUAL RASH, SWELLING OR PAIN  UNUSUAL VAGINAL DISCHARGE OR ITCHING   Items with * indicate a potential emergency and should be followed up as soon as possible or go to the Emergency Department if any problems should occur.  Please show the CHEMOTHERAPY ALERT CARD or IMMUNOTHERAPY ALERT  CARD at check-in to the Emergency Department and triage nurse.  Should you have questions after your visit or need to cancel or reschedule your appointment, please contact [facility]  Dept: [phone]  and follow the prompts.  Office hours are 8:00 a.m. to 4:30 p.m. Monday - Friday. Please note that voicemails left after 4:00 p.m. may not be returned until the following business day.  We are closed weekends and major holidays. You have access to a nurse at all times for urgent questions. Please call the main number to the clinic Dept: [phone] and follow the prompts.   For any non-urgent questions, you may also contact your provider using MyChart. We now offer e-Visits for anyone 73 and older to request care online for non-urgent symptoms. For details visit [email or web address].   Also download the MyChart app! Go to the app store, search "MyChart", open the app, select [facility], and log in with your MyChart username and password.

## 2022-08-22 MED FILL — Dexamethasone Sodium Phosphate Inj 100 MG/10ML: INTRAMUSCULAR | Qty: 1 | Status: AC

## 2022-08-23 ENCOUNTER — Inpatient Hospital Stay: Payer: BC Managed Care – PPO

## 2022-08-23 ENCOUNTER — Inpatient Hospital Stay (HOSPITAL_BASED_OUTPATIENT_CLINIC_OR_DEPARTMENT_OTHER): Payer: BC Managed Care – PPO | Admitting: Adult Health

## 2022-08-23 ENCOUNTER — Encounter: Payer: Self-pay | Admitting: Adult Health

## 2022-08-23 ENCOUNTER — Other Ambulatory Visit: Payer: Self-pay

## 2022-08-23 ENCOUNTER — Inpatient Hospital Stay: Payer: BC Managed Care – PPO | Attending: Hematology and Oncology

## 2022-08-23 DIAGNOSIS — Z170 Estrogen receptor positive status [ER+]: Secondary | ICD-10-CM

## 2022-08-23 DIAGNOSIS — Z5111 Encounter for antineoplastic chemotherapy: Secondary | ICD-10-CM | POA: Insufficient documentation

## 2022-08-23 DIAGNOSIS — Z5112 Encounter for antineoplastic immunotherapy: Secondary | ICD-10-CM | POA: Diagnosis present

## 2022-08-23 DIAGNOSIS — C50411 Malignant neoplasm of upper-outer quadrant of right female breast: Secondary | ICD-10-CM | POA: Diagnosis not present

## 2022-08-23 DIAGNOSIS — Z95828 Presence of other vascular implants and grafts: Secondary | ICD-10-CM

## 2022-08-23 LAB — CBC WITH DIFFERENTIAL (CANCER CENTER ONLY)
Abs Immature Granulocytes: 0.01 K/uL (ref 0.00–0.07)
Basophils Absolute: 0 K/uL (ref 0.0–0.1)
Basophils Relative: 1 %
Eosinophils Absolute: 0 K/uL (ref 0.0–0.5)
Eosinophils Relative: 1 %
HCT: 29.2 % — ABNORMAL LOW (ref 36.0–46.0)
Hemoglobin: 9.2 g/dL — ABNORMAL LOW (ref 12.0–15.0)
Immature Granulocytes: 0 %
Lymphocytes Relative: 59 %
Lymphs Abs: 1.8 K/uL (ref 0.7–4.0)
MCH: 24.9 pg — ABNORMAL LOW (ref 26.0–34.0)
MCHC: 31.5 g/dL (ref 30.0–36.0)
MCV: 79.1 fL — ABNORMAL LOW (ref 80.0–100.0)
Monocytes Absolute: 0.1 K/uL (ref 0.1–1.0)
Monocytes Relative: 4 %
Neutro Abs: 1.1 K/uL — ABNORMAL LOW (ref 1.7–7.7)
Neutrophils Relative %: 35 %
Platelet Count: 278 K/uL (ref 150–400)
RBC: 3.69 MIL/uL — ABNORMAL LOW (ref 3.87–5.11)
RDW: 19.3 % — ABNORMAL HIGH (ref 11.5–15.5)
WBC Count: 3.1 K/uL — ABNORMAL LOW (ref 4.0–10.5)
nRBC: 0 % (ref 0.0–0.2)

## 2022-08-23 LAB — CMP (CANCER CENTER ONLY)
ALT: 12 U/L (ref 0–44)
AST: 13 U/L — ABNORMAL LOW (ref 15–41)
Albumin: 4.2 g/dL (ref 3.5–5.0)
Alkaline Phosphatase: 56 U/L (ref 38–126)
Anion gap: 4 — ABNORMAL LOW (ref 5–15)
BUN: 14 mg/dL (ref 6–20)
CO2: 30 mmol/L (ref 22–32)
Calcium: 9.7 mg/dL (ref 8.9–10.3)
Chloride: 106 mmol/L (ref 98–111)
Creatinine: 0.81 mg/dL (ref 0.44–1.00)
GFR, Estimated: >60 mL/min (ref >60)
Glucose, Bld: 91 mg/dL (ref 70–99)
Potassium: 3.8 mmol/L (ref 3.5–5.1)
Sodium: 140 mmol/L (ref 135–145)
Total Bilirubin: 0.4 mg/dL (ref 0.3–1.2)
Total Protein: 7.1 g/dL (ref 6.5–8.1)

## 2022-08-23 MED ORDER — ACETAMINOPHEN 325 MG PO TABS
650.0000 mg | ORAL_TABLET | Freq: Once | ORAL | Status: AC
  Administered 2022-08-23: 650 mg via ORAL
  Filled 2022-08-23: qty 2

## 2022-08-23 MED ORDER — TRASTUZUMAB-ANNS CHEMO 150 MG IV SOLR
2.0000 mg/kg | Freq: Once | INTRAVENOUS | Status: AC
  Administered 2022-08-23: 126 mg via INTRAVENOUS
  Filled 2022-08-23: qty 6

## 2022-08-23 MED ORDER — SODIUM CHLORIDE 0.9% FLUSH
10.0000 mL | Freq: Once | INTRAVENOUS | Status: AC
  Administered 2022-08-23: 10 mL

## 2022-08-23 MED ORDER — HEPARIN SOD (PORK) LOCK FLUSH 100 UNIT/ML IV SOLN
500.0000 [IU] | Freq: Once | INTRAVENOUS | Status: AC | PRN
  Administered 2022-08-23: 500 [IU]

## 2022-08-23 MED ORDER — SODIUM CHLORIDE 0.9 % IV SOLN: Freq: Once | INTRAVENOUS | Status: AC

## 2022-08-23 MED ORDER — SODIUM CHLORIDE 0.9 % IV SOLN
10.0000 mg | Freq: Once | INTRAVENOUS | Status: AC
  Administered 2022-08-23: 10 mg via INTRAVENOUS
  Filled 2022-08-23: qty 10

## 2022-08-23 MED ORDER — FAMOTIDINE IN NACL 20-0.9 MG/50ML-% IV SOLN
20.0000 mg | Freq: Once | INTRAVENOUS | Status: AC
  Administered 2022-08-23: 20 mg via INTRAVENOUS
  Filled 2022-08-23: qty 50

## 2022-08-23 MED ORDER — SODIUM CHLORIDE 0.9 % IV SOLN
80.0000 mg/m2 | Freq: Once | INTRAVENOUS | Status: AC
  Administered 2022-08-23: 138 mg via INTRAVENOUS
  Filled 2022-08-23: qty 23

## 2022-08-23 MED ORDER — DIPHENHYDRAMINE HCL 50 MG/ML IJ SOLN
50.0000 mg | Freq: Once | INTRAMUSCULAR | Status: AC
  Administered 2022-08-23: 50 mg via INTRAVENOUS
  Filled 2022-08-23: qty 1

## 2022-08-23 MED ORDER — SODIUM CHLORIDE 0.9% FLUSH
10.0000 mL | INTRAVENOUS | Status: DC | PRN
  Administered 2022-08-23: 10 mL

## 2022-08-23 NOTE — Patient Instructions (Signed)
[facility]  Discharge Instructions: Thank you for choosing [facility] to provide your oncology and hematology care.   If you have a lab appointment with the [facility], please go directly to the [facility] and check in at the registration area.   Wear comfortable clothing and clothing appropriate for easy access to any Portacath or PICC line.   We strive to give you quality time with your provider. You may need to reschedule your appointment if you arrive late (15 or more minutes).  Arriving late affects you and other patients whose appointments are after yours.  Also, if you miss three or more appointments without notifying the office, you may be dismissed from the clinic at the provider's discretion.      For prescription refill requests, have your pharmacy contact our office and allow 72 hours for refills to be completed.    Today you received the following chemotherapy and/or immunotherapy agents: Trastuzumab, Paclitaxel.       To help prevent nausea and vomiting after your treatment, we encourage you to take your nausea medication as directed.  BELOW ARE SYMPTOMS THAT SHOULD BE REPORTED IMMEDIATELY: *FEVER GREATER THAN 100.4 F (38 C) OR HIGHER *CHILLS OR SWEATING *NAUSEA AND VOMITING THAT IS NOT CONTROLLED WITH YOUR NAUSEA MEDICATION *UNUSUAL SHORTNESS OF BREATH *UNUSUAL BRUISING OR BLEEDING *URINARY PROBLEMS (pain or burning when urinating, or frequent urination) *BOWEL PROBLEMS (unusual diarrhea, constipation, pain near the anus) TENDERNESS IN MOUTH AND THROAT WITH OR WITHOUT PRESENCE OF ULCERS (sore throat, sores in mouth, or a toothache) UNUSUAL RASH, SWELLING OR PAIN  UNUSUAL VAGINAL DISCHARGE OR ITCHING   Items with * indicate a potential emergency and should be followed up as soon as possible or go to the Emergency Department if any problems should occur.  Please show the CHEMOTHERAPY ALERT CARD or IMMUNOTHERAPY  ALERT CARD at check-in to the Emergency Department and triage nurse.  Should you have questions after your visit or need to cancel or reschedule your appointment, please contact [facility]  Dept: [phone]  and follow the prompts.  Office hours are 8:00 a.m. to 4:30 p.m. Monday - Friday. Please note that voicemails left after 4:00 p.m. may not be returned until the following business day.  We are closed weekends and major holidays. You have access to a nurse at all times for urgent questions. Please call the main number to the clinic Dept: [phone] and follow the prompts.   For any non-urgent questions, you may also contact your provider using MyChart. We now offer e-Visits for anyone 75 and older to request care online for non-urgent symptoms. For details visit [email or web address].   Also download the MyChart app! Go to the app store, search "MyChart", open the app, select [facility], and log in with your MyChart username and password.

## 2022-08-23 NOTE — Progress Notes (Signed)
Dawn Bates, Dawn Bates, [place] [address]   DIAGNOSIS:  Cancer Staging  Malignant neoplasm of upper-outer quadrant of right breast in female, estrogen receptor positive ([place]) Staging form: Breast, AJCC 8th Edition - Clinical stage from 06/06/2022: Stage IA (cT1c, cN0, cM0, G3, ER+, PR+, HER2+) - Signed by Benay Pike, MD on 07/19/2022 Stage prefix: Initial diagnosis Histologic grading system: 3 grade system - Pathologic stage from 07/05/2022: Stage IA (pT1c, pN0, cM0, G3, ER+, PR+, HER2+) - Signed by Gardenia Phlegm, NP on 08/02/2022 Stage prefix: Initial diagnosis Histologic grading system: 3 grade system   SUMMARY OF ONCOLOGIC HISTORY: Oncology History  Malignant neoplasm of upper-outer quadrant of right breast in female, estrogen receptor positive ([place])  05/26/2022 Mammogram   Diagnostic mammogram after patient noticed a palpable lump in the right breast showed suspicious mass at the palpable site of concern in the right breast at 10:00 measuring 1.2 cm. Ultrasound of the right breast showed no evidence of lymphadenopathy.   06/06/2022 Pathology Results   Right breast needle core biopsy at 10:00 showed overall grade 2 invasive ductal carcinoma.  Prognostic showed ER 100% positive strong staining PR 5% positive strong staining, Ki-67 of 50% HER2 3+ by [facility]   06/06/2022 Cancer Staging   Staging form: Breast, AJCC 8th Edition - Clinical stage from 06/06/2022: Stage IA (cT1c, cN0, cM0, G3, ER+, PR+, HER2+) - Signed by Benay Pike, MD on 07/19/2022 Stage prefix: Initial diagnosis Histologic grading system: 3 grade system   06/26/2022 Genetic Testing   Negative [facility] CancerNext Expanded Panel.  Report date is 06/26/2022.   The CancerNext-Expanded gene panel offered by [facility] and includes sequencing and rearrangement analysis for the following 77 genes: AIP, ALK, APC, ATM, AXIN2, BAP1, BARD1, BLM,  BMPR1A, BRCA1, BRCA2, BRIP1, CDC73, CDH1, CDK4, CDKN1B, CDKN2A, CHEK2, CTNNA1, DICER1, FANCC, FH, FLCN, GALNT12, KIF1B, LZTR1, MAX, MEN1, MET, MLH1, MSH2, MSH3, MSH6, MUTYH, NBN, NF1, NF2, NTHL1, PALB2, PHOX2B, PMS2, POT1, PRKAR1A, PTCH1, PTEN, RAD51C, RAD51D, RB1, RECQL, RET, SDHA, SDHAF2, SDHB, SDHC, SDHD, SMAD4, SMARCA4, SMARCB1, SMARCE1, STK11, SUFU, TMEM127, TP53, TSC1, TSC2, VHL and XRCC2 (sequencing and deletion/duplication); EGFR, EGLN1, HOXB13, KIT, MITF, PDGFRA, POLD1, and POLE (sequencing only); EPCAM and GREM1 (deletion/duplication only).    06/28/2022 Initial Diagnosis   Malignant neoplasm of upper-outer quadrant of right breast in female, estrogen receptor positive ([place])   07/05/2022 Surgery   Right breast lumpectomy: 1.3 cm invasive poorly differentiated ductal carcinoma grade 3, margins negative, 2 sentinel lymph nodes negative   07/05/2022 Cancer Staging   Staging form: Breast, AJCC 8th Edition - Pathologic stage from 07/05/2022: Stage IA (pT1c, pN0, cM0, G3, ER+, PR+, HER2+) - Signed by Gardenia Phlegm, NP on 08/02/2022 Stage prefix: Initial diagnosis Histologic grading system: 3 grade system   08/02/2022 -  Chemotherapy   Patient is on Treatment Plan : BREAST Paclitaxel + Trastuzumab q7d / Trastuzumab q21d       CURRENT THERAPY: Taxol/Herceptin  INTERVAL HISTORY: Dawn Bates 52 y.o. female returns for follow-up and evaluation prior to receiving her fourth week of weekly Taxol and Herceptin.  She is taking oral iron every other day.  She is tolerating it well.  She has ANC of 1.1.  She denies peripheral neuropathy and endorses fatigue which she manages with energy conservation..  She is also experiencing weight loss which she is down 7 pounds.  She is meeting and f/u with nutrition.  Working  on oral intake.    Her most recent echocardiogram occurred on July 20, 2022 demonstrating a left ventricular ejection fraction of 60 to 65% with a normal global  longitudinal strain.   Patient Active Problem List   Diagnosis Date Noted   Port-A-Cath in place 08/02/2022   Genetic testing 07/20/2022   Malignant neoplasm of upper-outer quadrant of right breast in female, estrogen receptor positive ([place]) 06/28/2022   Abnormal uterine and vaginal bleeding, unspecified 10/04/2021   S/P laparoscopic assisted vaginal hysterectomy (LAVH) 10/04/2021   Hypothyroidism 07/28/2021   Menorrhagia 07/28/2021   Bilateral lower extremity edema 04/23/2021   Primary hypertension 02/12/2021   Elevated LFTs 05/22/2020   Thyroid nodule 10/10/2019   Headache, unspecified headache type 07/16/2019   Non-seasonal allergic rhinitis due to pollen 07/16/2019   Vitamin D deficiency, unspecified 07/16/2019   Hyperlipidemia 07/16/2019   Anemia 08/25/2017    is allergic to latex.  MEDICAL HISTORY: Past Medical History:  Diagnosis Date   Abnormal uterine bleeding 2022   Anemia 02/12/2021   Patient is taking iron supplementation.   Cancer [facility])    breast   COVID-19 2020   cold-like symptoms   Endometriosis    Fibroids    Graves disease    Hypertension    Hyperthyroidism 2022   s/p Nuclear Medicine RAI therapy on 08/07/20   Seasonal allergies    severe chronic allergies, runny nose, congestion, cough when she lays down due to drainage   Vitamin D deficiency 2021   Wears glasses     SURGICAL HISTORY: Past Surgical History:  Procedure Laterality Date   BREAST BIOPSY Right 06/06/2022   [place] RT BREAST BX W LOC DEV 1ST LESION IMG BX SPEC US GUIDE 06/06/2022 [facility] MAMMOGRAPHY   BREAST LUMPECTOMY WITH SENTINEL LYMPH NODE BIOPSY Right 07/05/2022   Procedure: RIGHT BREAST LUMPECTOMY WITH SENTINEL LYMPH NODE BX;  Surgeon: Stark Klein, MD;  Location: [place];  Service: General;  Laterality: Right;   CYSTOSCOPY  10/04/2021   Procedure: CYSTOSCOPY;  Surgeon: Crawford Givens, MD;  Location: [place];  Service: Gynecology;;   LAPAROSCOPIC  VAGINAL HYSTERECTOMY WITH SALPINGECTOMY N/A 10/04/2021   Procedure: LAPAROSCOPIC ASSISTED VAGINAL HYSTERECTOMY WITH SALPINGECTOMY;  Surgeon: Crawford Givens, MD;  Location: [place];  Service: Gynecology;  Laterality: N/A;   PORTACATH PLACEMENT Left 07/05/2022   Procedure: INSERTION PORT-A-CATH;  Surgeon: Stark Klein, MD;  Location: [place];  Service: General;  Laterality: Left;   TUBAL LIGATION  09/2008    SOCIAL HISTORY: Social History   Socioeconomic History   Marital status: Married    Spouse name: Not on file   Number of children: Not on file   Years of education: Not on file   Highest education level: Not on file  Occupational History   Not on file  Tobacco Use   Smoking status: Never   Smokeless tobacco: Never  Vaping Use   Vaping Use: Never used  Substance and Sexual Activity   Alcohol use: No   Drug use: No   Sexual activity: Yes    Partners: Male    Birth control/protection: Surgical    Comment: BTL   Other Topics Concern   Not on file  Social History Narrative   Not on file   Social Determinants of Health   Financial Resource Strain: Not on file  Food Insecurity: Not on file  Transportation Needs: Not on file  Physical Activity: Not on file  Stress: Not on file  Social Connections: Not on file  Intimate Partner Violence: Not on file    FAMILY HISTORY: Family History  Problem Relation Age of Onset   Breast cancer Sister 25       triple negative; d. early 4s   Cancer Paternal Aunt        unknown type   Thyroid cancer Half-Sister        mat half sister; dx 100s; unknown cell type    Review of Systems  Constitutional:  Positive for fatigue. Negative for appetite change, chills, fever and unexpected weight change.  HENT:   Negative for hearing loss, lump/mass, mouth sores and trouble swallowing.   Eyes:  Negative for eye problems and icterus.  Respiratory:  Negative for chest tightness, cough and shortness of  breath.   Cardiovascular:  Negative for chest pain, leg swelling and palpitations.  Gastrointestinal:  Negative for abdominal distention, abdominal pain, constipation, diarrhea, nausea and vomiting.  Endocrine: Negative for hot flashes.  Genitourinary:  Negative for difficulty urinating.   Musculoskeletal:  Negative for arthralgias.  Skin:  Negative for itching and rash.  Neurological:  Negative for dizziness, extremity weakness, headaches and numbness.  Hematological:  Negative for adenopathy. Does not bruise/bleed easily.  Psychiatric/Behavioral:  Negative for depression. The patient is not nervous/anxious.       PHYSICAL EXAMINATION  ECOG PERFORMANCE STATUS: 1 - Symptomatic but completely ambulatory  Vitals:   08/23/22 1046  BP: 133/87  Pulse: 72  Resp: 14  Temp: 98 F (36.7 C)  SpO2: 100%    Physical Exam Constitutional:      General: She is not in acute distress.    Appearance: Normal appearance. She is not toxic-appearing.  HENT:     Head: Normocephalic and atraumatic.  Eyes:     General: No scleral icterus. Cardiovascular:     Rate and Rhythm: Normal rate and regular rhythm.     Pulses: Normal pulses.     Heart sounds: Normal heart sounds.  Pulmonary:     Effort: Pulmonary effort is normal.     Breath sounds: Normal breath sounds.  Abdominal:     General: Abdomen is flat. Bowel sounds are normal. There is no distension.     Palpations: Abdomen is soft.     Tenderness: There is no abdominal tenderness.  Musculoskeletal:        General: No swelling.     Cervical back: Neck supple.  Lymphadenopathy:     Cervical: No cervical adenopathy.  Skin:    General: Skin is warm and dry.     Findings: No rash.  Neurological:     General: No focal deficit present.     Mental Status: She is alert.  Psychiatric:        Mood and Affect: Mood normal.        Behavior: Behavior normal.     LABORATORY DATA:  CBC    Component Value Date/Time   WBC 3.1 (L)  08/23/2022 1030   WBC 9.5 10/05/2021 0046   RBC 3.69 (L) 08/23/2022 1030   HGB 9.2 (L) 08/23/2022 1030   HCT 29.2 (L) 08/23/2022 1030   PLT 278 08/23/2022 1030   MCV 79.1 (L) 08/23/2022 1030   MCH 24.9 (L) 08/23/2022 1030   MCHC 31.5 08/23/2022 1030   RDW 19.3 (H) 08/23/2022 1030   LYMPHSABS 1.8 08/23/2022 1030   MONOABS 0.1 08/23/2022 1030   EOSABS 0.0 08/23/2022 1030   BASOSABS 0.0 08/23/2022 1030    CMP  Component Value Date/Time   NA 140 08/23/2022 1030   K 3.8 08/23/2022 1030   CL 106 08/23/2022 1030   CO2 30 08/23/2022 1030   GLUCOSE 91 08/23/2022 1030   BUN 14 08/23/2022 1030   CREATININE 0.81 08/23/2022 1030   CREATININE 0.78 04/23/2021 1505   CALCIUM 9.7 08/23/2022 1030   PROT 7.1 08/23/2022 1030   ALBUMIN 4.2 08/23/2022 1030   AST 13 (L) 08/23/2022 1030   ALT 12 08/23/2022 1030   ALKPHOS 56 08/23/2022 1030   BILITOT 0.4 08/23/2022 1030   GFRNONAA >60 08/23/2022 1030   GFRNONAA 115 05/21/2020 1708   GFRAA 133 05/21/2020 1708         ASSESSMENT and THERAPY PLAN:   Malignant neoplasm of upper-outer quadrant of right breast in female, estrogen receptor positive ([place]) Shiane is a 52 year old woman with stage Ia triple positive breast cancer currently receiving adjuvant chemotherapy with Taxol and Herceptin.  She is tolerating her treatment well today and she will proceed with therapy.    I reviewed that her [place] is 1.1.  I gave Margarete 2 options for her [place].  She can proceed with chemotherapy and receive Granix or biosimilar on Saturday and that can help support her white blood cells prior to her next treatment on Tuesday.  The other option is to proceed with the treatment today and forego the growth factor support with the risk that she may miss treatment next week due to her [place] being below 1.  She would like to forego any injection on Saturday and will monitor her white blood cells and sees what happens next week.  She understands that we may have to  hold her treatment.  I continue to encourage her to eat plenty foods with fruits and vegetables and lean proteins but also food that can be appealing to her such as some carbohydrates to help with gaining some weight.  She will continue to follow-up with nutrition as scheduled.  Managing her fatigue with energy conservation quite well and I recommended that she continue to do this.  She is not experiencing any peripheral neuropathy and we will continue to monitor her closely for this.  She will continue on oral iron for her iron deficiency anemia.  Her MCV is slightly increasing, therefore I am hopeful that it is working.  She is tolerating this well and will continue this and we will continue to observe her labs.  We will see her back in 1 week for labs, follow-up, and her next treatment.   All questions were answered. The patient knows to call the clinic with any problems, questions or concerns. We can certainly see the patient much sooner if necessary.  Total encounter time:30 minutes*in face-to-face visit time, chart review, lab review, care coordination, order entry, and documentation of the encounter time.    Wilber Bihari, NP 08/23/22 11:46 AM Medical Oncology and Hematology [facility] [place], [place] [address] Tel. [phone]    Fax. [phone]  *Total Encounter Time as defined by the Centers for Medicare and Medicaid Services includes, in addition to the face-to-face time of a patient visit (documented in the note above) non-face-to-face time: obtaining and reviewing outside history, ordering and reviewing medications, tests or procedures, care coordination (communications with other health care professionals or caregivers) and documentation in the medical record.

## 2022-08-23 NOTE — Assessment & Plan Note (Addendum)
Psalms is a 52 year old woman with stage Ia triple positive breast cancer currently receiving adjuvant chemotherapy with Taxol and Herceptin.  She is tolerating her treatment well today and she will proceed with therapy.    I reviewed that her [place] is 1.1.  I gave Margarete 2 options for her [place].  She can proceed with chemotherapy and receive Granix or biosimilar on Saturday and that can help support her white blood cells prior to her next treatment on Tuesday.  The other option is to proceed with the treatment today and forego the growth factor support with the risk that she may miss treatment next week due to her [place] being below 1.  She would like to forego any injection on Saturday and will monitor her white blood cells and sees what happens next week.  She understands that we may have to hold her treatment.  I continue to encourage her to eat plenty foods with fruits and vegetables and lean proteins but also food that can be appealing to her such as some carbohydrates to help with gaining some weight.  She will continue to follow-up with nutrition as scheduled.  Managing her fatigue with energy conservation quite well and I recommended that she continue to do this.  She is not experiencing any peripheral neuropathy and we will continue to monitor her closely for this.  She will continue on oral iron for her iron deficiency anemia.  Her MCV is slightly increasing, therefore I am hopeful that it is working.  She is tolerating this well and will continue this and we will continue to observe her labs.  We will see her back in 1 week for labs, follow-up, and her next treatment.

## 2022-08-23 NOTE — Progress Notes (Signed)
Per Wilber Bihari NP, okay to treat pt with ANC of 1.1.

## 2022-08-24 ENCOUNTER — Other Ambulatory Visit: Payer: Self-pay

## 2022-08-29 MED FILL — Dexamethasone Sodium Phosphate Inj 100 MG/10ML: INTRAMUSCULAR | Qty: 1 | Status: AC

## 2022-08-30 ENCOUNTER — Inpatient Hospital Stay (HOSPITAL_BASED_OUTPATIENT_CLINIC_OR_DEPARTMENT_OTHER): Payer: BC Managed Care – PPO | Admitting: Hematology and Oncology

## 2022-08-30 ENCOUNTER — Inpatient Hospital Stay: Payer: BC Managed Care – PPO

## 2022-08-30 ENCOUNTER — Other Ambulatory Visit: Payer: Self-pay

## 2022-08-30 VITALS — BP 127/82 | HR 73 | Temp 97.9°F | Resp 16 | Ht 66.0 in | Wt 138.2 lb

## 2022-08-30 DIAGNOSIS — Z95828 Presence of other vascular implants and grafts: Secondary | ICD-10-CM

## 2022-08-30 DIAGNOSIS — Z170 Estrogen receptor positive status [ER+]: Secondary | ICD-10-CM | POA: Diagnosis not present

## 2022-08-30 DIAGNOSIS — C50411 Malignant neoplasm of upper-outer quadrant of right female breast: Secondary | ICD-10-CM

## 2022-08-30 DIAGNOSIS — Z5112 Encounter for antineoplastic immunotherapy: Secondary | ICD-10-CM | POA: Diagnosis not present

## 2022-08-30 LAB — CBC WITH DIFFERENTIAL (CANCER CENTER ONLY)
Abs Immature Granulocytes: 0.02 10*3/uL (ref 0.00–0.07)
Basophils Absolute: 0 10*3/uL (ref 0.0–0.1)
Basophils Relative: 1 %
Eosinophils Absolute: 0.1 10*3/uL (ref 0.0–0.5)
Eosinophils Relative: 1 %
HCT: 30 % — ABNORMAL LOW (ref 36.0–46.0)
Hemoglobin: 9.5 g/dL — ABNORMAL LOW (ref 12.0–15.0)
Immature Granulocytes: 1 %
Lymphocytes Relative: 54 %
Lymphs Abs: 2 10*3/uL (ref 0.7–4.0)
MCH: 25.4 pg — ABNORMAL LOW (ref 26.0–34.0)
MCHC: 31.7 g/dL (ref 30.0–36.0)
MCV: 80.2 fL (ref 80.0–100.0)
Monocytes Absolute: 0.2 10*3/uL (ref 0.1–1.0)
Monocytes Relative: 4 %
Neutro Abs: 1.4 10*3/uL — ABNORMAL LOW (ref 1.7–7.7)
Neutrophils Relative %: 39 %
Platelet Count: 274 10*3/uL (ref 150–400)
RBC: 3.74 MIL/uL — ABNORMAL LOW (ref 3.87–5.11)
RDW: 20.3 % — ABNORMAL HIGH (ref 11.5–15.5)
WBC Count: 3.6 10*3/uL — ABNORMAL LOW (ref 4.0–10.5)
nRBC: 0 % (ref 0.0–0.2)

## 2022-08-30 LAB — CMP (CANCER CENTER ONLY)
ALT: 11 U/L (ref 0–44)
AST: 12 U/L — ABNORMAL LOW (ref 15–41)
Albumin: 4.1 g/dL (ref 3.5–5.0)
Alkaline Phosphatase: 52 U/L (ref 38–126)
Anion gap: 4 — ABNORMAL LOW (ref 5–15)
BUN: 13 mg/dL (ref 6–20)
CO2: 29 mmol/L (ref 22–32)
Calcium: 9.7 mg/dL (ref 8.9–10.3)
Chloride: 106 mmol/L (ref 98–111)
Creatinine: 0.72 mg/dL (ref 0.44–1.00)
GFR, Estimated: >60 mL/min (ref >60)
Glucose, Bld: 98 mg/dL (ref 70–99)
Potassium: 4 mmol/L (ref 3.5–5.1)
Sodium: 139 mmol/L (ref 135–145)
Total Bilirubin: 0.3 mg/dL (ref 0.3–1.2)
Total Protein: 7 g/dL (ref 6.5–8.1)

## 2022-08-30 MED ORDER — FAMOTIDINE IN NACL 20-0.9 MG/50ML-% IV SOLN
20.0000 mg | Freq: Once | INTRAVENOUS | Status: AC
  Administered 2022-08-30: 20 mg via INTRAVENOUS
  Filled 2022-08-30: qty 50

## 2022-08-30 MED ORDER — SODIUM CHLORIDE 0.9 % IV SOLN: Freq: Once | INTRAVENOUS | Status: AC

## 2022-08-30 MED ORDER — SODIUM CHLORIDE 0.9 % IV SOLN
10.0000 mg | Freq: Once | INTRAVENOUS | Status: AC
  Administered 2022-08-30: 10 mg via INTRAVENOUS
  Filled 2022-08-30: qty 10

## 2022-08-30 MED ORDER — ACETAMINOPHEN 325 MG PO TABS
650.0000 mg | ORAL_TABLET | Freq: Once | ORAL | Status: AC
  Administered 2022-08-30: 650 mg via ORAL
  Filled 2022-08-30: qty 2

## 2022-08-30 MED ORDER — HEPARIN SOD (PORK) LOCK FLUSH 100 UNIT/ML IV SOLN
500.0000 [IU] | Freq: Once | INTRAVENOUS | Status: AC | PRN
  Administered 2022-08-30: 500 [IU]

## 2022-08-30 MED ORDER — SODIUM CHLORIDE 0.9 % IV SOLN
80.0000 mg/m2 | Freq: Once | INTRAVENOUS | Status: AC
  Administered 2022-08-30: 138 mg via INTRAVENOUS
  Filled 2022-08-30: qty 23

## 2022-08-30 MED ORDER — SODIUM CHLORIDE 0.9% FLUSH
10.0000 mL | Freq: Once | INTRAVENOUS | Status: AC
  Administered 2022-08-30: 10 mL

## 2022-08-30 MED ORDER — DIPHENHYDRAMINE HCL 50 MG/ML IJ SOLN
50.0000 mg | Freq: Once | INTRAMUSCULAR | Status: AC
  Administered 2022-08-30: 50 mg via INTRAVENOUS
  Filled 2022-08-30: qty 1

## 2022-08-30 MED ORDER — TRASTUZUMAB-ANNS CHEMO 150 MG IV SOLR
2.0000 mg/kg | Freq: Once | INTRAVENOUS | Status: AC
  Administered 2022-08-30: 126 mg via INTRAVENOUS
  Filled 2022-08-30: qty 6

## 2022-08-30 MED ORDER — SODIUM CHLORIDE 0.9% FLUSH
10.0000 mL | INTRAVENOUS | Status: DC | PRN
  Administered 2022-08-30: 10 mL

## 2022-08-30 NOTE — Progress Notes (Signed)
[name] NOTE  Patient Care Team: Martinique, [name], MD as PCP - General (Family Medicine) Mauro Kaufmann, RN as Oncology Nurse Navigator Rockwell Germany, RN as Oncology Nurse Navigator Benay Pike, MD as Consulting Physician (Hematology and Oncology) Stark Klein, MD as Consulting Physician (General Surgery) Eppie Gibson, MD as Attending Physician (Radiation Oncology)  CHIEF COMPLAINTS/PURPOSE OF CONSULTATION:  Breast cancer follow up.  HISTORY OF PRESENTING ILLNESS:  Dawn Bates 52 y.o. female is here because of recent diagnosis of right breast cancer  I reviewed her records extensively and collaborated the history with the patient.  SUMMARY OF ONCOLOGIC HISTORY: Oncology History  Malignant neoplasm of upper-outer quadrant of right breast in female, estrogen receptor positive ([place])  05/26/2022 Mammogram   Diagnostic mammogram after patient noticed a palpable lump in the right breast showed suspicious mass at the palpable site of concern in the right breast at 10:00 measuring 1.2 cm. Ultrasound of the right breast showed no evidence of lymphadenopathy.   06/06/2022 Pathology Results   Right breast needle core biopsy at 10:00 showed overall grade 2 invasive ductal carcinoma.  Prognostic showed ER 100% positive strong staining PR 5% positive strong staining, Ki-67 of 50% HER2 3+ by [facility]   06/06/2022 Cancer Staging   Staging form: Breast, AJCC 8th Edition - Clinical stage from 06/06/2022: Stage IA (cT1c, cN0, cM0, G3, ER+, PR+, HER2+) - Signed by Benay Pike, MD on 07/19/2022 Stage prefix: Initial diagnosis Histologic grading system: 3 grade system   06/26/2022 Genetic Testing   Negative [facility] CancerNext Expanded Panel.  Report date is 06/26/2022.   The CancerNext-Expanded gene panel offered by [facility] and includes sequencing and rearrangement analysis for the following 77 genes: AIP, ALK, APC, ATM, AXIN2, BAP1, BARD1, BLM, BMPR1A,  BRCA1, BRCA2, BRIP1, CDC73, CDH1, CDK4, CDKN1B, CDKN2A, CHEK2, CTNNA1, DICER1, FANCC, FH, FLCN, GALNT12, KIF1B, LZTR1, MAX, MEN1, MET, MLH1, MSH2, MSH3, MSH6, MUTYH, NBN, NF1, NF2, NTHL1, PALB2, PHOX2B, PMS2, POT1, PRKAR1A, PTCH1, PTEN, RAD51C, RAD51D, RB1, RECQL, RET, SDHA, SDHAF2, SDHB, SDHC, SDHD, SMAD4, SMARCA4, SMARCB1, SMARCE1, STK11, SUFU, TMEM127, TP53, TSC1, TSC2, VHL and XRCC2 (sequencing and deletion/duplication); EGFR, EGLN1, HOXB13, KIT, MITF, PDGFRA, POLD1, and POLE (sequencing only); EPCAM and GREM1 (deletion/duplication only).    06/28/2022 Initial Diagnosis   Malignant neoplasm of upper-outer quadrant of right breast in female, estrogen receptor positive ([place])   07/05/2022 Surgery   Right breast lumpectomy: 1.3 cm invasive poorly differentiated ductal carcinoma grade 3, margins negative, 2 sentinel lymph nodes negative   07/05/2022 Cancer Staging   Staging form: Breast, AJCC 8th Edition - Pathologic stage from 07/05/2022: Stage IA (pT1c, pN0, cM0, G3, ER+, PR+, HER2+) - Signed by Gardenia Phlegm, NP on 08/02/2022 Stage prefix: Initial diagnosis Histologic grading system: 3 grade system   08/02/2022 -  Chemotherapy   Patient is on Treatment Plan : BREAST Paclitaxel + Trastuzumab q7d / Trastuzumab q21d       Patient arrived to the appointment today with her husband. Since last visit she is doing ok except for ongoing fatigue, and some runny dose and congestion No fevers or chills No neuropathy She noticed some loose stool may be once a day or every other day No cough, chest pressure.  Rest of the pertinent 10 point ROS reviewed and negative.  MEDICAL HISTORY:  Past Medical History:  Diagnosis Date   Abnormal uterine bleeding 2022   Anemia 02/12/2021   Patient is taking iron supplementation.   Cancer ([place])  breast   COVID-19 2020   cold-like symptoms   Endometriosis    Fibroids    Graves disease    Hypertension    Hyperthyroidism 2022   s/p Nuclear  Medicine RAI therapy on 08/07/20   Seasonal allergies    severe chronic allergies, runny nose, congestion, cough when she lays down due to drainage   Vitamin D deficiency 2021   Wears glasses     SURGICAL HISTORY: Past Surgical History:  Procedure Laterality Date   BREAST BIOPSY Right 06/06/2022   [place] RT BREAST BX W LOC DEV 1ST LESION IMG BX SPEC US GUIDE 06/06/2022 [facility] MAMMOGRAPHY   BREAST LUMPECTOMY WITH SENTINEL LYMPH NODE BIOPSY Right 07/05/2022   Procedure: RIGHT BREAST LUMPECTOMY WITH SENTINEL LYMPH NODE BX;  Surgeon: Stark Klein, MD;  Location: [place];  Service: General;  Laterality: Right;   CYSTOSCOPY  10/04/2021   Procedure: CYSTOSCOPY;  Surgeon: Crawford Givens, MD;  Location: [place];  Service: Gynecology;;   LAPAROSCOPIC VAGINAL HYSTERECTOMY WITH SALPINGECTOMY N/A 10/04/2021   Procedure: LAPAROSCOPIC ASSISTED VAGINAL HYSTERECTOMY WITH SALPINGECTOMY;  Surgeon: Crawford Givens, MD;  Location: [place];  Service: Gynecology;  Laterality: N/A;   PORTACATH PLACEMENT Left 07/05/2022   Procedure: INSERTION PORT-A-CATH;  Surgeon: Stark Klein, MD;  Location: [place];  Service: General;  Laterality: Left;   TUBAL LIGATION  09/2008    SOCIAL HISTORY: Social History   Socioeconomic History   Marital status: Married    Spouse name: Not on file   Number of children: Not on file   Years of education: Not on file   Highest education level: Not on file  Occupational History   Not on file  Tobacco Use   Smoking status: Never   Smokeless tobacco: Never  Vaping Use   Vaping Use: Never used  Substance and Sexual Activity   Alcohol use: No   Drug use: No   Sexual activity: Yes    Partners: Male    Birth control/protection: Surgical    Comment: BTL   Other Topics Concern   Not on file  Social History Narrative   Not on file   Social Determinants of Health   Financial Resource Strain: Not on file  Food  Insecurity: Not on file  Transportation Needs: Not on file  Physical Activity: Not on file  Stress: Not on file  Social Connections: Not on file  Intimate Partner Violence: Not on file    FAMILY HISTORY: Family History  Problem Relation Age of Onset   Breast cancer Sister 37       triple negative; d. early 58s   Cancer Paternal Aunt        unknown type   Thyroid cancer Half-Sister        mat half sister; dx 73s; unknown cell type   Maternal uncle with prostate cancer, multiple myeloma.  ALLERGIES:  is allergic to latex.  MEDICATIONS:  Current Outpatient Medications  Medication Sig Dispense Refill   amLODipine (NORVASC) 5 MG tablet TAKE 1 TABLET (5 MG TOTAL) BY MOUTH DAILY. 90 tablet 1   Calcium-Phosphorus-Vitamin D (CITRACAL +D3 PO) Take by mouth.     ferrous sulfate 325 (65 FE) MG tablet Take 325 mg by mouth daily with breakfast.     lidocaine-prilocaine (EMLA) cream Apply to affected area once 30 g 3   ondansetron (ZOFRAN) 8 MG tablet Take 1 tablet (8 mg total) by mouth every 8 (eight) hours as needed for  nausea or vomiting. (Patient not taking: Reported on 08/23/2022) 30 tablet 1   oxyCODONE (OXY IR/ROXICODONE) 5 MG immediate release tablet Take 1 tablet (5 mg total) by mouth every 6 (six) hours as needed for severe pain. (Patient not taking: Reported on 08/23/2022) 8 tablet 0   prochlorperazine (COMPAZINE) 10 MG tablet Take 1 tablet (10 mg total) by mouth every 6 (six) hours as needed for nausea or vomiting. (Patient not taking: Reported on 08/23/2022) 30 tablet 1   valACYclovir (VALTREX) 500 MG tablet Take 500 mg by mouth as needed.     No current facility-administered medications for this visit.    REVIEW OF SYSTEMS:   Constitutional: Denies fevers, chills or abnormal night sweats Eyes: Denies blurriness of vision, double vision or watery eyes Ears, nose, mouth, throat, and face: Denies mucositis or sore throat Respiratory: Denies cough, dyspnea or wheezes Cardiovascular:  Denies palpitation, chest discomfort or lower extremity swelling Gastrointestinal:  Denies nausea, heartburn or change in bowel habits Skin: Denies abnormal skin rashes Lymphatics: Denies new lymphadenopathy or easy bruising Neurological:Denies numbness, tingling or new weaknesses Behavioral/Psych: Mood is stable, no new changes  Breast:  Denies any palpable lumps or discharge All other systems were reviewed with the patient and are negative.  PHYSICAL EXAMINATION: ECOG PERFORMANCE STATUS: 0 - Asymptomatic  BP 127/82 (BP Location: Left Arm, Patient Position: Sitting)   Pulse 73   Temp 97.9 F (36.6 C) (Temporal)   Resp 16   Ht '5\' 6"'$  (1.676 m)   Wt 138 lb 3.2 oz (62.7 kg)   LMP 09/09/2021 (Exact Date)   SpO2 100%   BMI 22.31 kg/m   Physical Exam Constitutional:      Appearance: Normal appearance.  Cardiovascular:     Rate and Rhythm: Normal rate and regular rhythm.  Musculoskeletal:        General: Normal range of motion.     Cervical back: Normal range of motion and neck supple. No rigidity.  Lymphadenopathy:     Cervical: No cervical adenopathy.  Skin:    General: Skin is warm and dry.  Neurological:     Mental Status: She is alert.  Psychiatric:        Mood and Affect: Mood normal.      LABORATORY DATA:  I have reviewed the data as listed Lab Results  Component Value Date   WBC 3.6 (L) 08/30/2022   HGB 9.5 (L) 08/30/2022   HCT 30.0 (L) 08/30/2022   MCV 80.2 08/30/2022   PLT 274 08/30/2022   Lab Results  Component Value Date   NA 140 08/23/2022   K 3.8 08/23/2022   CL 106 08/23/2022   CO2 30 08/23/2022    RADIOGRAPHIC STUDIES: I have personally reviewed the radiological reports and agreed with the findings in the report.  ASSESSMENT AND PLAN:  Malignant neoplasm of upper-outer quadrant of right breast in female, estrogen receptor positive ([place]) This is a very pleasant 52 year old premenopausal female patient with newly diagnosed right breast  invasive ductal carcinoma T1 N0 M0 ER/PR and HER2 amplified referred to medical oncology for recommendations.  Given triple positive disease, we have discussed about considering adjuvant chemotherapy.  She now here after surgery, final pathology showed tumor measuring 1.3 x 1.2 x 1.0 cm, negative margins, negative lymph nodes.  She is now on weekly herceptin and taxol, doing well except for fatigue. She seems to have a URI, cold and congestion, no systemic symptoms. Ok to proceed with chemo as planned. She will be  a candidate for adjuvant antiestrogen therapy after completing adjuvant radiation. Ok to proceed if labs are within parameters  # 2 IDA, recommend oral iron every day or every other day. If she didn't respond to oral iron in the next few weeks, we will consider IV iron. Hb has remained stable for now  #3 Mild neutropenia from chemo.   Benay Pike MD   Total time spent: 20 minutes including history, physical exam, review of records, counseling and coordination of care All questions were answered. The patient knows to call the clinic with any problems, questions or concerns.    Benay Pike, MD 08/30/22

## 2022-08-30 NOTE — Assessment & Plan Note (Addendum)
This is a very pleasant 52 year old premenopausal female patient with newly diagnosed right breast invasive ductal carcinoma T1 N0 M0 ER/PR and HER2 amplified referred to medical oncology for recommendations.  Given triple positive disease, we have discussed about considering adjuvant chemotherapy.  She now here after surgery, final pathology showed tumor measuring 1.3 x 1.2 x 1.0 cm, negative margins, negative lymph nodes.  She is now on weekly herceptin and taxol, doing well except for fatigue. She seems to have a URI, cold and congestion, no systemic symptoms. Ok to proceed with chemo as planned. She will be a candidate for adjuvant antiestrogen therapy after completing adjuvant radiation. Ok to proceed if labs are within parameters  # 2 IDA, recommend oral iron every day or every other day. If she didn't respond to oral iron in the next few weeks, we will consider IV iron. Hb has remained stable for now  #3 Mild neutropenia from chemo.   Benay Pike MD

## 2022-08-30 NOTE — Progress Notes (Signed)
Per [place] MD, ok to treat today with ANC 1.4

## 2022-08-30 NOTE — Patient Instructions (Signed)
[facility]  Discharge Instructions: Thank you for choosing [facility] to provide your oncology and hematology care.   If you have a lab appointment with the Cancer Center, please go directly to the Cancer Center and check in at the registration area.   Wear comfortable clothing and clothing appropriate for easy access to any Portacath or PICC line.   We strive to give you quality time with your provider. You may need to reschedule your appointment if you arrive late (15 or more minutes).  Arriving late affects you and other patients whose appointments are after yours.  Also, if you miss three or more appointments without notifying the office, you may be dismissed from the clinic at the provider's discretion.      For prescription refill requests, have your pharmacy contact our office and allow 72 hours for refills to be completed.    Today you received the following chemotherapy and/or immunotherapy agents: Kanjinti, Paclitaxel.       To help prevent nausea and vomiting after your treatment, we encourage you to take your nausea medication as directed.  BELOW ARE SYMPTOMS THAT SHOULD BE REPORTED IMMEDIATELY: *FEVER GREATER THAN 100.4 F (38 C) OR HIGHER *CHILLS OR SWEATING *NAUSEA AND VOMITING THAT IS NOT CONTROLLED WITH YOUR NAUSEA MEDICATION *UNUSUAL SHORTNESS OF BREATH *UNUSUAL BRUISING OR BLEEDING *URINARY PROBLEMS (pain or burning when urinating, or frequent urination) *BOWEL PROBLEMS (unusual diarrhea, constipation, pain near the anus) TENDERNESS IN MOUTH AND THROAT WITH OR WITHOUT PRESENCE OF ULCERS (sore throat, sores in mouth, or a toothache) UNUSUAL RASH, SWELLING OR PAIN  UNUSUAL VAGINAL DISCHARGE OR ITCHING   Items with * indicate a potential emergency and should be followed up as soon as possible or go to the Emergency Department if any problems should occur.  Please show the CHEMOTHERAPY ALERT CARD or IMMUNOTHERAPY ALERT  CARD at check-in to the Emergency Department and triage nurse.  Should you have questions after your visit or need to cancel or reschedule your appointment, please contact [facility]  Dept: [phone]  and follow the prompts.  Office hours are 8:00 a.m. to 4:30 p.m. Monday - Friday. Please note that voicemails left after 4:00 p.m. may not be returned until the following business day.  We are closed weekends and major holidays. You have access to a nurse at all times for urgent questions. Please call the main number to the clinic Dept: [phone] and follow the prompts.   For any non-urgent questions, you may also contact your provider using MyChart. We now offer e-Visits for anyone 18 and older to request care online for non-urgent symptoms. For details visit [email or web address].   Also download the MyChart app! Go to the app store, search "MyChart", open the app, select , and log in with your MyChart username and password.   

## 2022-09-05 MED FILL — Dexamethasone Sodium Phosphate Inj 100 MG/10ML: INTRAMUSCULAR | Qty: 1 | Status: AC

## 2022-09-06 ENCOUNTER — Inpatient Hospital Stay: Payer: BC Managed Care – PPO

## 2022-09-06 ENCOUNTER — Inpatient Hospital Stay (HOSPITAL_BASED_OUTPATIENT_CLINIC_OR_DEPARTMENT_OTHER): Payer: BC Managed Care – PPO | Admitting: Hematology and Oncology

## 2022-09-06 ENCOUNTER — Inpatient Hospital Stay: Payer: BC Managed Care – PPO | Admitting: Dietician

## 2022-09-06 VITALS — BP 129/75 | HR 71 | Temp 97.9°F | Resp 16 | Ht 66.0 in | Wt 138.3 lb

## 2022-09-06 DIAGNOSIS — Z5112 Encounter for antineoplastic immunotherapy: Secondary | ICD-10-CM | POA: Diagnosis not present

## 2022-09-06 DIAGNOSIS — Z170 Estrogen receptor positive status [ER+]: Secondary | ICD-10-CM

## 2022-09-06 DIAGNOSIS — Z95828 Presence of other vascular implants and grafts: Secondary | ICD-10-CM

## 2022-09-06 DIAGNOSIS — C50411 Malignant neoplasm of upper-outer quadrant of right female breast: Secondary | ICD-10-CM

## 2022-09-06 LAB — CMP (CANCER CENTER ONLY)
ALT: 12 U/L (ref 0–44)
AST: 12 U/L — ABNORMAL LOW (ref 15–41)
Albumin: 4 g/dL (ref 3.5–5.0)
Alkaline Phosphatase: 50 U/L (ref 38–126)
Anion gap: 4 — ABNORMAL LOW (ref 5–15)
BUN: 17 mg/dL (ref 6–20)
CO2: 29 mmol/L (ref 22–32)
Calcium: 9.4 mg/dL (ref 8.9–10.3)
Chloride: 107 mmol/L (ref 98–111)
Creatinine: 0.77 mg/dL (ref 0.44–1.00)
GFR, Estimated: >60 mL/min (ref >60)
Glucose, Bld: 106 mg/dL — ABNORMAL HIGH (ref 70–99)
Potassium: 3.8 mmol/L (ref 3.5–5.1)
Sodium: 140 mmol/L (ref 135–145)
Total Bilirubin: 0.4 mg/dL (ref 0.3–1.2)
Total Protein: 6.7 g/dL (ref 6.5–8.1)

## 2022-09-06 LAB — CBC WITH DIFFERENTIAL (CANCER CENTER ONLY)
Abs Immature Granulocytes: 0.02 10*3/uL (ref 0.00–0.07)
Basophils Absolute: 0 10*3/uL (ref 0.0–0.1)
Basophils Relative: 1 %
Eosinophils Absolute: 0.1 10*3/uL (ref 0.0–0.5)
Eosinophils Relative: 2 %
HCT: 29.1 % — ABNORMAL LOW (ref 36.0–46.0)
Hemoglobin: 9.2 g/dL — ABNORMAL LOW (ref 12.0–15.0)
Immature Granulocytes: 1 %
Lymphocytes Relative: 45 %
Lymphs Abs: 1.6 10*3/uL (ref 0.7–4.0)
MCH: 25.6 pg — ABNORMAL LOW (ref 26.0–34.0)
MCHC: 31.6 g/dL (ref 30.0–36.0)
MCV: 80.8 fL (ref 80.0–100.0)
Monocytes Absolute: 0.2 10*3/uL (ref 0.1–1.0)
Monocytes Relative: 5 %
Neutro Abs: 1.6 10*3/uL — ABNORMAL LOW (ref 1.7–7.7)
Neutrophils Relative %: 46 %
Platelet Count: 239 10*3/uL (ref 150–400)
RBC: 3.6 MIL/uL — ABNORMAL LOW (ref 3.87–5.11)
RDW: 20.2 % — ABNORMAL HIGH (ref 11.5–15.5)
WBC Count: 3.5 10*3/uL — ABNORMAL LOW (ref 4.0–10.5)
nRBC: 0 % (ref 0.0–0.2)

## 2022-09-06 MED ORDER — FAMOTIDINE IN NACL 20-0.9 MG/50ML-% IV SOLN
20.0000 mg | Freq: Once | INTRAVENOUS | Status: AC
  Administered 2022-09-06: 20 mg via INTRAVENOUS
  Filled 2022-09-06: qty 50

## 2022-09-06 MED ORDER — SODIUM CHLORIDE 0.9 % IV SOLN
10.0000 mg | Freq: Once | INTRAVENOUS | Status: AC
  Administered 2022-09-06: 10 mg via INTRAVENOUS
  Filled 2022-09-06: qty 10

## 2022-09-06 MED ORDER — HEPARIN SOD (PORK) LOCK FLUSH 100 UNIT/ML IV SOLN
500.0000 [IU] | Freq: Once | INTRAVENOUS | Status: AC | PRN
  Administered 2022-09-06: 500 [IU]

## 2022-09-06 MED ORDER — TRASTUZUMAB-ANNS CHEMO 150 MG IV SOLR
2.0000 mg/kg | Freq: Once | INTRAVENOUS | Status: AC
  Administered 2022-09-06: 126 mg via INTRAVENOUS
  Filled 2022-09-06: qty 6

## 2022-09-06 MED ORDER — ACETAMINOPHEN 325 MG PO TABS
650.0000 mg | ORAL_TABLET | Freq: Once | ORAL | Status: AC
  Administered 2022-09-06: 650 mg via ORAL
  Filled 2022-09-06: qty 2

## 2022-09-06 MED ORDER — SODIUM CHLORIDE 0.9% FLUSH
10.0000 mL | INTRAVENOUS | Status: DC | PRN
  Administered 2022-09-06: 10 mL

## 2022-09-06 MED ORDER — DIPHENHYDRAMINE HCL 50 MG/ML IJ SOLN
50.0000 mg | Freq: Once | INTRAMUSCULAR | Status: AC
  Administered 2022-09-06: 50 mg via INTRAVENOUS
  Filled 2022-09-06: qty 1

## 2022-09-06 MED ORDER — SODIUM CHLORIDE 0.9% FLUSH
10.0000 mL | Freq: Once | INTRAVENOUS | Status: AC
  Administered 2022-09-06: 10 mL

## 2022-09-06 MED ORDER — SODIUM CHLORIDE 0.9 % IV SOLN
80.0000 mg/m2 | Freq: Once | INTRAVENOUS | Status: AC
  Administered 2022-09-06: 138 mg via INTRAVENOUS
  Filled 2022-09-06: qty 23

## 2022-09-06 MED ORDER — SODIUM CHLORIDE 0.9 % IV SOLN: Freq: Once | INTRAVENOUS | Status: AC

## 2022-09-06 NOTE — Assessment & Plan Note (Signed)
This is a very pleasant 52 year old premenopausal female patient with newly diagnosed right breast invasive ductal carcinoma T1 N0 M0 ER/PR and HER2 amplified referred to medical oncology for recommendations.  Given triple positive disease, we have discussed about considering adjuvant chemotherapy.  She now here after surgery, final pathology showed tumor measuring 1.3 x 1.2 x 1.0 cm, negative margins, negative lymph nodes.  She is now on weekly herceptin and taxol, doing well except for fatigue. No concerning review of systems today.  Physical examination normal.  Okay to proceed with chemotherapy if all labs are within parameters. Since she is doing remarkably well, we will continue chemotherapy and she can follow-up with me every 2 weeks.  Husband had some good questions about how we will proceed with surveillance.  We have discussed about annual mammograms, no role for routine scans or labs.  She was however encouraged to contact me sooner for any questions.  Benay Pike MD

## 2022-09-06 NOTE — Progress Notes (Signed)
[place] NOTE  Patient Care Team: Martinique, [name], MD as PCP - General (Family Medicine) Mauro Kaufmann, RN as Oncology Nurse Navigator Rockwell Germany, RN as Oncology Nurse Navigator Benay Pike, MD as Consulting Physician (Hematology and Oncology) Stark Klein, MD as Consulting Physician (General Surgery) Eppie Gibson, MD as Attending Physician (Radiation Oncology)  CHIEF COMPLAINTS/PURPOSE OF CONSULTATION:  Breast cancer follow up.  HISTORY OF PRESENTING ILLNESS:  Dawn Bates 52 y.o. female is here because of recent diagnosis of right breast cancer  I reviewed her records extensively and collaborated the history with the patient.  SUMMARY OF ONCOLOGIC HISTORY: Oncology History  Malignant neoplasm of upper-outer quadrant of right breast in female, estrogen receptor positive ([place])  05/26/2022 Mammogram   Diagnostic mammogram after patient noticed a palpable lump in the right breast showed suspicious mass at the palpable site of concern in the right breast at 10:00 measuring 1.2 cm. Ultrasound of the right breast showed no evidence of lymphadenopathy.   06/06/2022 Pathology Results   Right breast needle core biopsy at 10:00 showed overall grade 2 invasive ductal carcinoma.  Prognostic showed ER 100% positive strong staining PR 5% positive strong staining, Ki-67 of 50% HER2 3+ by [facility]   06/06/2022 Cancer Staging   Staging form: Breast, AJCC 8th Edition - Clinical stage from 06/06/2022: Stage IA (cT1c, cN0, cM0, G3, ER+, PR+, HER2+) - Signed by Benay Pike, MD on 07/19/2022 Stage prefix: Initial diagnosis Histologic grading system: 3 grade system   06/26/2022 Genetic Testing   Negative [facility] CancerNext Expanded Panel.  Report date is 06/26/2022.   The CancerNext-Expanded gene panel offered by [facility] and includes sequencing and rearrangement analysis for the following 77 genes: AIP, ALK, APC, ATM, AXIN2, BAP1, BARD1, BLM, BMPR1A,  BRCA1, BRCA2, BRIP1, CDC73, CDH1, CDK4, CDKN1B, CDKN2A, CHEK2, CTNNA1, DICER1, FANCC, FH, FLCN, GALNT12, KIF1B, LZTR1, MAX, MEN1, MET, MLH1, MSH2, MSH3, MSH6, MUTYH, NBN, NF1, NF2, NTHL1, PALB2, PHOX2B, PMS2, POT1, PRKAR1A, PTCH1, PTEN, RAD51C, RAD51D, RB1, RECQL, RET, SDHA, SDHAF2, SDHB, SDHC, SDHD, SMAD4, SMARCA4, SMARCB1, SMARCE1, STK11, SUFU, TMEM127, TP53, TSC1, TSC2, VHL and XRCC2 (sequencing and deletion/duplication); EGFR, EGLN1, HOXB13, KIT, MITF, PDGFRA, POLD1, and POLE (sequencing only); EPCAM and GREM1 (deletion/duplication only).    06/28/2022 Initial Diagnosis   Malignant neoplasm of upper-outer quadrant of right breast in female, estrogen receptor positive ([place])   07/05/2022 Surgery   Right breast lumpectomy: 1.3 cm invasive poorly differentiated ductal carcinoma grade 3, margins negative, 2 sentinel lymph nodes negative   07/05/2022 Cancer Staging   Staging form: Breast, AJCC 8th Edition - Pathologic stage from 07/05/2022: Stage IA (pT1c, pN0, cM0, G3, ER+, PR+, HER2+) - Signed by Gardenia Phlegm, NP on 08/02/2022 Stage prefix: Initial diagnosis Histologic grading system: 3 grade system   08/02/2022 -  Chemotherapy   Patient is on Treatment Plan : BREAST Paclitaxel + Trastuzumab q7d / Trastuzumab q21d       Patient arrived to the appointment today with her husband. She continues to do really well since her last visit here.  No fatigue, nausea, vomiting, diarrhea or neuropathy.  No change in bowel habits or urinary habits.  Rest of the pertinent 10 point ROS reviewed and negative.  MEDICAL HISTORY:  Past Medical History:  Diagnosis Date   Abnormal uterine bleeding 2022   Anemia 02/12/2021   Patient is taking iron supplementation.   Cancer [facility])    breast   COVID-19 2020   cold-like symptoms   Endometriosis  Fibroids    Graves disease    Hypertension    Hyperthyroidism 2022   s/p Nuclear Medicine RAI therapy on 08/07/20   Seasonal allergies    severe chronic  allergies, runny nose, congestion, cough when she lays down due to drainage   Vitamin D deficiency 2021   Wears glasses     SURGICAL HISTORY: Past Surgical History:  Procedure Laterality Date   BREAST BIOPSY Right 06/06/2022   [place] RT BREAST BX W LOC DEV 1ST LESION IMG BX SPEC US GUIDE 06/06/2022 [facility] MAMMOGRAPHY   BREAST LUMPECTOMY WITH SENTINEL LYMPH NODE BIOPSY Right 07/05/2022   Procedure: RIGHT BREAST LUMPECTOMY WITH SENTINEL LYMPH NODE BX;  Surgeon: Stark Klein, MD;  Location: [place];  Service: General;  Laterality: Right;   CYSTOSCOPY  10/04/2021   Procedure: CYSTOSCOPY;  Surgeon: Crawford Givens, MD;  Location: [place];  Service: Gynecology;;   LAPAROSCOPIC VAGINAL HYSTERECTOMY WITH SALPINGECTOMY N/A 10/04/2021   Procedure: LAPAROSCOPIC ASSISTED VAGINAL HYSTERECTOMY WITH SALPINGECTOMY;  Surgeon: Crawford Givens, MD;  Location: [place];  Service: Gynecology;  Laterality: N/A;   PORTACATH PLACEMENT Left 07/05/2022   Procedure: INSERTION PORT-A-CATH;  Surgeon: Stark Klein, MD;  Location: [place];  Service: General;  Laterality: Left;   TUBAL LIGATION  09/2008    SOCIAL HISTORY: Social History   Socioeconomic History   Marital status: Married    Spouse name: Not on file   Number of children: Not on file   Years of education: Not on file   Highest education level: Not on file  Occupational History   Not on file  Tobacco Use   Smoking status: Never   Smokeless tobacco: Never  Vaping Use   Vaping Use: Never used  Substance and Sexual Activity   Alcohol use: No   Drug use: No   Sexual activity: Yes    Partners: Male    Birth control/protection: Surgical    Comment: BTL   Other Topics Concern   Not on file  Social History Narrative   Not on file   Social Determinants of Health   Financial Resource Strain: Not on file  Food Insecurity: Not on file  Transportation Needs: Not on file  Physical  Activity: Not on file  Stress: Not on file  Social Connections: Not on file  Intimate Partner Violence: Not on file    FAMILY HISTORY: Family History  Problem Relation Age of Onset   Breast cancer Sister 22       triple negative; d. early 49s   Cancer Paternal Aunt        unknown type   Thyroid cancer Half-Sister        mat half sister; dx 61s; unknown cell type   Maternal uncle with prostate cancer, multiple myeloma.  ALLERGIES:  is allergic to latex.  MEDICATIONS:  Current Outpatient Medications  Medication Sig Dispense Refill   amLODipine (NORVASC) 5 MG tablet TAKE 1 TABLET (5 MG TOTAL) BY MOUTH DAILY. 90 tablet 1   Calcium-Phosphorus-Vitamin D (CITRACAL +D3 PO) Take by mouth.     ferrous sulfate 325 (65 FE) MG tablet Take 325 mg by mouth daily with breakfast.     lidocaine-prilocaine (EMLA) cream Apply to affected area once 30 g 3   ondansetron (ZOFRAN) 8 MG tablet Take 1 tablet (8 mg total) by mouth every 8 (eight) hours as needed for nausea or vomiting. (Patient not taking: Reported on 08/23/2022) 30 tablet 1   oxyCODONE (  OXY IR/ROXICODONE) 5 MG immediate release tablet Take 1 tablet (5 mg total) by mouth every 6 (six) hours as needed for severe pain. (Patient not taking: Reported on 08/23/2022) 8 tablet 0   prochlorperazine (COMPAZINE) 10 MG tablet Take 1 tablet (10 mg total) by mouth every 6 (six) hours as needed for nausea or vomiting. (Patient not taking: Reported on 08/23/2022) 30 tablet 1   valACYclovir (VALTREX) 500 MG tablet Take 500 mg by mouth as needed.     No current facility-administered medications for this visit.   Facility-Administered Medications Ordered in Other Visits  Medication Dose Route Frequency Provider Last Rate Last Admin   heparin lock flush 100 unit/mL  500 Units Intracatheter Once PRN Gianlucas Evenson, Arletha Pili, MD       PACLitaxel (TAXOL) 138 mg in sodium chloride 0.9 % 250 mL chemo infusion (</= 80mg /m2)  80 mg/m2 (Treatment Plan Recorded) Intravenous Once  Daylani Deblois, MD       sodium chloride flush (NS) 0.9 % injection 10 mL  10 mL Intracatheter PRN Denae Zulueta, MD        REVIEW OF SYSTEMS:   Constitutional: Denies fevers, chills or abnormal night sweats Eyes: Denies blurriness of vision, double vision or watery eyes Ears, nose, mouth, throat, and face: Denies mucositis or sore throat Respiratory: Denies cough, dyspnea or wheezes Cardiovascular: Denies palpitation, chest discomfort or lower extremity swelling Gastrointestinal:  Denies nausea, heartburn or change in bowel habits Skin: Denies abnormal skin rashes Lymphatics: Denies new lymphadenopathy or easy bruising Neurological:Denies numbness, tingling or new weaknesses Behavioral/Psych: Mood is stable, no new changes  Breast:  Denies any palpable lumps or discharge All other systems were reviewed with the patient and are negative.  PHYSICAL EXAMINATION: ECOG PERFORMANCE STATUS: 0 - Asymptomatic  BP 129/75 (BP Location: Left Arm, Patient Position: Sitting)   Pulse 71   Temp 97.9 F (36.6 C) (Temporal)   Resp 16   Ht 5\' 6"  (1.676 m)   Wt 138 lb 4.8 oz (62.7 kg)   LMP 09/09/2021 (Exact Date)   SpO2 100%   BMI 22.32 kg/m   Physical Exam Constitutional:      Appearance: Normal appearance.  Cardiovascular:     Rate and Rhythm: Normal rate and regular rhythm.  Musculoskeletal:        General: Normal range of motion.     Cervical back: Normal range of motion and neck supple. No rigidity.  Lymphadenopathy:     Cervical: No cervical adenopathy.  Skin:    General: Skin is warm and dry.  Neurological:     Mental Status: She is alert.  Psychiatric:        Mood and Affect: Mood normal.      LABORATORY DATA:  I have reviewed the data as listed Lab Results  Component Value Date   WBC 3.5 (L) 09/06/2022   HGB 9.2 (L) 09/06/2022   HCT 29.1 (L) 09/06/2022   MCV 80.8 09/06/2022   PLT 239 09/06/2022   Lab Results  Component Value Date   NA 140 09/06/2022   K 3.8  09/06/2022   CL 107 09/06/2022   CO2 29 09/06/2022    RADIOGRAPHIC STUDIES: I have personally reviewed the radiological reports and agreed with the findings in the report.  ASSESSMENT AND PLAN:  Malignant neoplasm of upper-outer quadrant of right breast in female, estrogen receptor positive ([place]) This is a very pleasant 52 year old premenopausal female patient with newly diagnosed right breast invasive ductal carcinoma T1 N0 M0 ER/PR and  HER2 amplified referred to medical oncology for recommendations.  Given triple positive disease, we have discussed about considering adjuvant chemotherapy.  She now here after surgery, final pathology showed tumor measuring 1.3 x 1.2 x 1.0 cm, negative margins, negative lymph nodes.  She is now on weekly herceptin and taxol, doing well except for fatigue. No concerning review of systems today.  Physical examination normal.  Okay to proceed with chemotherapy if all labs are within parameters. Since she is doing remarkably well, we will continue chemotherapy and she can follow-up with me every 2 weeks.  Husband had some good questions about how we will proceed with surveillance.  We have discussed about annual mammograms, no role for routine scans or labs.  She was however encouraged to contact me sooner for any questions.  Benay Pike MD   Total time spent: 20 minutes including history, physical exam, review of records, counseling and coordination of care All questions were answered. The patient knows to call the clinic with any problems, questions or concerns.    Benay Pike, MD 09/06/22

## 2022-09-06 NOTE — Progress Notes (Signed)
Nutrition Follow-up:   Patient with stage IA breast cancer of right breast, estrogen receptor positive. She is currently receiving chemotherapy with taxol + kanjinti q21d (first 2/6). Patient is under the care of Dr. Chryl Heck.  Met with patient and husband during infusion. Patient reports appetite has improved. She is eating a variety of foods and including good sources of lean proteins. Patient drinking 68 ounces of water. Patient denies nausea, vomiting, diarrhea, constipation.     Medications: reviewed   Labs: reviewed   Anthropometrics: Wt 138 lb 4.8 oz today increased   2/20 - 136 lb 12.8 oz   NUTRITION DIAGNOSIS: Food and nutrition related knowledge deficit improved     INTERVENTION:  Continue strategies for increasing calories and protein with small frequent meals and snacks Encouraged activity as able     MONITORING, EVALUATION, GOAL: weight trends, intake   NEXT VISIT: To be scheduled as needed with treatment

## 2022-09-06 NOTE — Patient Instructions (Signed)
[facility]  Discharge Instructions: Thank you for choosing [facility] to provide your oncology and hematology care.   If you have a lab appointment with the Cancer Center, please go directly to the Cancer Center and check in at the registration area.   Wear comfortable clothing and clothing appropriate for easy access to any Portacath or PICC line.   We strive to give you quality time with your provider. You may need to reschedule your appointment if you arrive late (15 or more minutes).  Arriving late affects you and other patients whose appointments are after yours.  Also, if you miss three or more appointments without notifying the office, you may be dismissed from the clinic at the provider's discretion.      For prescription refill requests, have your pharmacy contact our office and allow 72 hours for refills to be completed.    Today you received the following chemotherapy and/or immunotherapy agents Kanjinti & Taxol      To help prevent nausea and vomiting after your treatment, we encourage you to take your nausea medication as directed.  BELOW ARE SYMPTOMS THAT SHOULD BE REPORTED IMMEDIATELY: *FEVER GREATER THAN 100.4 F (38 C) OR HIGHER *CHILLS OR SWEATING *NAUSEA AND VOMITING THAT IS NOT CONTROLLED WITH YOUR NAUSEA MEDICATION *UNUSUAL SHORTNESS OF BREATH *UNUSUAL BRUISING OR BLEEDING *URINARY PROBLEMS (pain or burning when urinating, or frequent urination) *BOWEL PROBLEMS (unusual diarrhea, constipation, pain near the anus) TENDERNESS IN MOUTH AND THROAT WITH OR WITHOUT PRESENCE OF ULCERS (sore throat, sores in mouth, or a toothache) UNUSUAL RASH, SWELLING OR PAIN  UNUSUAL VAGINAL DISCHARGE OR ITCHING   Items with * indicate a potential emergency and should be followed up as soon as possible or go to the Emergency Department if any problems should occur.  Please show the CHEMOTHERAPY ALERT CARD or IMMUNOTHERAPY ALERT CARD at  check-in to the Emergency Department and triage nurse.  Should you have questions after your visit or need to cancel or reschedule your appointment, please contact [facility]  Dept: [phone]  and follow the prompts.  Office hours are 8:00 a.m. to 4:30 p.m. Monday - Friday. Please note that voicemails left after 4:00 p.m. may not be returned until the following business day.  We are closed weekends and major holidays. You have access to a nurse at all times for urgent questions. Please call the main number to the clinic Dept: [phone] and follow the prompts.   For any non-urgent questions, you may also contact your provider using MyChart. We now offer e-Visits for anyone 18 and older to request care online for non-urgent symptoms. For details visit [email or web address].   Also download the MyChart app! Go to the app store, search "MyChart", open the app, select [facility], and log in with your MyChart username and password.  

## 2022-09-12 MED FILL — Dexamethasone Sodium Phosphate Inj 100 MG/10ML: INTRAMUSCULAR | Qty: 1 | Status: AC

## 2022-09-13 ENCOUNTER — Inpatient Hospital Stay: Payer: BC Managed Care – PPO

## 2022-09-13 ENCOUNTER — Inpatient Hospital Stay: Payer: BC Managed Care – PPO | Admitting: Hematology and Oncology

## 2022-09-13 ENCOUNTER — Other Ambulatory Visit: Payer: Self-pay

## 2022-09-13 VITALS — BP 120/83 | HR 69 | Temp 98.6°F | Resp 13 | Wt 138.5 lb

## 2022-09-13 DIAGNOSIS — Z95828 Presence of other vascular implants and grafts: Secondary | ICD-10-CM

## 2022-09-13 DIAGNOSIS — Z5112 Encounter for antineoplastic immunotherapy: Secondary | ICD-10-CM | POA: Diagnosis not present

## 2022-09-13 DIAGNOSIS — Z170 Estrogen receptor positive status [ER+]: Secondary | ICD-10-CM

## 2022-09-13 LAB — CBC WITH DIFFERENTIAL (CANCER CENTER ONLY)
Abs Immature Granulocytes: 0.04 10*3/uL (ref 0.00–0.07)
Basophils Absolute: 0 10*3/uL (ref 0.0–0.1)
Basophils Relative: 1 %
Eosinophils Absolute: 0.1 10*3/uL (ref 0.0–0.5)
Eosinophils Relative: 2 %
HCT: 28.9 % — ABNORMAL LOW (ref 36.0–46.0)
Hemoglobin: 9.3 g/dL — ABNORMAL LOW (ref 12.0–15.0)
Immature Granulocytes: 1 %
Lymphocytes Relative: 45 %
Lymphs Abs: 1.7 10*3/uL (ref 0.7–4.0)
MCH: 26.2 pg (ref 26.0–34.0)
MCHC: 32.2 g/dL (ref 30.0–36.0)
MCV: 81.4 fL (ref 80.0–100.0)
Monocytes Absolute: 0.2 10*3/uL (ref 0.1–1.0)
Monocytes Relative: 5 %
Neutro Abs: 1.8 10*3/uL (ref 1.7–7.7)
Neutrophils Relative %: 46 %
Platelet Count: 233 10*3/uL (ref 150–400)
RBC: 3.55 MIL/uL — ABNORMAL LOW (ref 3.87–5.11)
RDW: 21.1 % — ABNORMAL HIGH (ref 11.5–15.5)
WBC Count: 3.8 10*3/uL — ABNORMAL LOW (ref 4.0–10.5)
nRBC: 0 % (ref 0.0–0.2)

## 2022-09-13 LAB — CMP (CANCER CENTER ONLY)
ALT: 12 U/L (ref 0–44)
AST: 12 U/L — ABNORMAL LOW (ref 15–41)
Albumin: 4 g/dL (ref 3.5–5.0)
Alkaline Phosphatase: 49 U/L (ref 38–126)
Anion gap: 3 — ABNORMAL LOW (ref 5–15)
BUN: 15 mg/dL (ref 6–20)
CO2: 31 mmol/L (ref 22–32)
Calcium: 9.6 mg/dL (ref 8.9–10.3)
Chloride: 106 mmol/L (ref 98–111)
Creatinine: 0.7 mg/dL (ref 0.44–1.00)
GFR, Estimated: >60 mL/min (ref >60)
Glucose, Bld: 94 mg/dL (ref 70–99)
Potassium: 3.8 mmol/L (ref 3.5–5.1)
Sodium: 140 mmol/L (ref 135–145)
Total Bilirubin: 0.4 mg/dL (ref 0.3–1.2)
Total Protein: 6.4 g/dL — ABNORMAL LOW (ref 6.5–8.1)

## 2022-09-13 MED ORDER — ACETAMINOPHEN 325 MG PO TABS
650.0000 mg | ORAL_TABLET | Freq: Once | ORAL | Status: AC
  Administered 2022-09-13: 650 mg via ORAL
  Filled 2022-09-13: qty 2

## 2022-09-13 MED ORDER — FAMOTIDINE IN NACL 20-0.9 MG/50ML-% IV SOLN
20.0000 mg | Freq: Once | INTRAVENOUS | Status: AC
  Administered 2022-09-13: 20 mg via INTRAVENOUS
  Filled 2022-09-13: qty 50

## 2022-09-13 MED ORDER — SODIUM CHLORIDE 0.9% FLUSH
10.0000 mL | Freq: Once | INTRAVENOUS | Status: AC
  Administered 2022-09-13: 10 mL

## 2022-09-13 MED ORDER — SODIUM CHLORIDE 0.9% FLUSH
10.0000 mL | INTRAVENOUS | Status: DC | PRN
  Administered 2022-09-13: 10 mL

## 2022-09-13 MED ORDER — SODIUM CHLORIDE 0.9 % IV SOLN: Freq: Once | INTRAVENOUS | Status: AC

## 2022-09-13 MED ORDER — SODIUM CHLORIDE 0.9 % IV SOLN
80.0000 mg/m2 | Freq: Once | INTRAVENOUS | Status: AC
  Administered 2022-09-13: 138 mg via INTRAVENOUS
  Filled 2022-09-13: qty 23

## 2022-09-13 MED ORDER — DIPHENHYDRAMINE HCL 50 MG/ML IJ SOLN
50.0000 mg | Freq: Once | INTRAMUSCULAR | Status: AC
  Administered 2022-09-13: 50 mg via INTRAVENOUS
  Filled 2022-09-13: qty 1

## 2022-09-13 MED ORDER — HEPARIN SOD (PORK) LOCK FLUSH 100 UNIT/ML IV SOLN
500.0000 [IU] | Freq: Once | INTRAVENOUS | Status: AC | PRN
  Administered 2022-09-13: 500 [IU]

## 2022-09-13 MED ORDER — SODIUM CHLORIDE 0.9 % IV SOLN
10.0000 mg | Freq: Once | INTRAVENOUS | Status: AC
  Administered 2022-09-13: 10 mg via INTRAVENOUS
  Filled 2022-09-13: qty 10

## 2022-09-13 MED ORDER — TRASTUZUMAB-ANNS CHEMO 150 MG IV SOLR
2.0000 mg/kg | Freq: Once | INTRAVENOUS | Status: AC
  Administered 2022-09-13: 126 mg via INTRAVENOUS
  Filled 2022-09-13: qty 6

## 2022-09-13 NOTE — Patient Instructions (Addendum)
[facility]  Discharge Instructions: Thank you for choosing [facility] to provide your oncology and hematology care.   If you have a lab appointment with the Cancer Center, please go directly to the Cancer Center and check in at the registration area.   Wear comfortable clothing and clothing appropriate for easy access to any Portacath or PICC line.   We strive to give you quality time with your provider. You may need to reschedule your appointment if you arrive late (15 or more minutes).  Arriving late affects you and other patients whose appointments are after yours.  Also, if you miss three or more appointments without notifying the office, you may be dismissed from the clinic at the provider's discretion.      For prescription refill requests, have your pharmacy contact our office and allow 72 hours for refills to be completed.    Today you received the following chemotherapy and/or immunotherapy agents: Trastuzumab, Paclitaxel.       To help prevent nausea and vomiting after your treatment, we encourage you to take your nausea medication as directed.  BELOW ARE SYMPTOMS THAT SHOULD BE REPORTED IMMEDIATELY: *FEVER GREATER THAN 100.4 F (38 C) OR HIGHER *CHILLS OR SWEATING *NAUSEA AND VOMITING THAT IS NOT CONTROLLED WITH YOUR NAUSEA MEDICATION *UNUSUAL SHORTNESS OF BREATH *UNUSUAL BRUISING OR BLEEDING *URINARY PROBLEMS (pain or burning when urinating, or frequent urination) *BOWEL PROBLEMS (unusual diarrhea, constipation, pain near the anus) TENDERNESS IN MOUTH AND THROAT WITH OR WITHOUT PRESENCE OF ULCERS (sore throat, sores in mouth, or a toothache) UNUSUAL RASH, SWELLING OR PAIN  UNUSUAL VAGINAL DISCHARGE OR ITCHING   Items with * indicate a potential emergency and should be followed up as soon as possible or go to the Emergency Department if any problems should occur.  Please show the CHEMOTHERAPY ALERT CARD or IMMUNOTHERAPY  ALERT CARD at check-in to the Emergency Department and triage nurse.  Should you have questions after your visit or need to cancel or reschedule your appointment, please contact [facility]  Dept: [phone]  and follow the prompts.  Office hours are 8:00 a.m. to 4:30 p.m. Monday - Friday. Please note that voicemails left after 4:00 p.m. may not be returned until the following business day.  We are closed weekends and major holidays. You have access to a nurse at all times for urgent questions. Please call the main number to the clinic Dept: [phone] and follow the prompts.   For any non-urgent questions, you may also contact your provider using MyChart. We now offer e-Visits for anyone 18 and older to request care online for non-urgent symptoms. For details visit [email or web address].   Also download the MyChart app! Go to the app store, search "MyChart", open the app, select [facility], and log in with your MyChart username and password.   

## 2022-09-19 ENCOUNTER — Encounter: Payer: Self-pay | Admitting: *Deleted

## 2022-09-19 MED FILL — Dexamethasone Sodium Phosphate Inj 100 MG/10ML: INTRAMUSCULAR | Qty: 1 | Status: AC

## 2022-09-20 ENCOUNTER — Other Ambulatory Visit: Payer: Self-pay

## 2022-09-20 ENCOUNTER — Inpatient Hospital Stay: Payer: BC Managed Care – PPO

## 2022-09-20 ENCOUNTER — Inpatient Hospital Stay: Payer: BC Managed Care – PPO | Attending: Hematology and Oncology

## 2022-09-20 ENCOUNTER — Inpatient Hospital Stay: Payer: BC Managed Care – PPO | Admitting: Hematology and Oncology

## 2022-09-20 VITALS — BP 129/85 | HR 68 | Resp 18

## 2022-09-20 DIAGNOSIS — C50411 Malignant neoplasm of upper-outer quadrant of right female breast: Secondary | ICD-10-CM | POA: Insufficient documentation

## 2022-09-20 DIAGNOSIS — Z170 Estrogen receptor positive status [ER+]: Secondary | ICD-10-CM

## 2022-09-20 DIAGNOSIS — Z79899 Other long term (current) drug therapy: Secondary | ICD-10-CM | POA: Insufficient documentation

## 2022-09-20 DIAGNOSIS — Z95828 Presence of other vascular implants and grafts: Secondary | ICD-10-CM

## 2022-09-20 DIAGNOSIS — Z5111 Encounter for antineoplastic chemotherapy: Secondary | ICD-10-CM | POA: Diagnosis present

## 2022-09-20 DIAGNOSIS — Z5112 Encounter for antineoplastic immunotherapy: Secondary | ICD-10-CM | POA: Diagnosis present

## 2022-09-20 LAB — CBC WITH DIFFERENTIAL (CANCER CENTER ONLY)
Abs Immature Granulocytes: 0.03 10*3/uL (ref 0.00–0.07)
Basophils Absolute: 0 10*3/uL (ref 0.0–0.1)
Basophils Relative: 1 %
Eosinophils Absolute: 0.1 10*3/uL (ref 0.0–0.5)
Eosinophils Relative: 2 %
HCT: 29.5 % — ABNORMAL LOW (ref 36.0–46.0)
Hemoglobin: 9.6 g/dL — ABNORMAL LOW (ref 12.0–15.0)
Immature Granulocytes: 1 %
Lymphocytes Relative: 41 %
Lymphs Abs: 1.8 10*3/uL (ref 0.7–4.0)
MCH: 26.7 pg (ref 26.0–34.0)
MCHC: 32.5 g/dL (ref 30.0–36.0)
MCV: 81.9 fL (ref 80.0–100.0)
Monocytes Absolute: 0.2 10*3/uL (ref 0.1–1.0)
Monocytes Relative: 4 %
Neutro Abs: 2.3 10*3/uL (ref 1.7–7.7)
Neutrophils Relative %: 51 %
Platelet Count: 232 10*3/uL (ref 150–400)
RBC: 3.6 MIL/uL — ABNORMAL LOW (ref 3.87–5.11)
RDW: 21.4 % — ABNORMAL HIGH (ref 11.5–15.5)
WBC Count: 4.4 10*3/uL (ref 4.0–10.5)
nRBC: 0 % (ref 0.0–0.2)

## 2022-09-20 LAB — CMP (CANCER CENTER ONLY)
ALT: 13 U/L (ref 0–44)
AST: 12 U/L — ABNORMAL LOW (ref 15–41)
Albumin: 4 g/dL (ref 3.5–5.0)
Alkaline Phosphatase: 51 U/L (ref 38–126)
Anion gap: 4 — ABNORMAL LOW (ref 5–15)
BUN: 22 mg/dL — ABNORMAL HIGH (ref 6–20)
CO2: 29 mmol/L (ref 22–32)
Calcium: 9.8 mg/dL (ref 8.9–10.3)
Chloride: 107 mmol/L (ref 98–111)
Creatinine: 0.69 mg/dL (ref 0.44–1.00)
GFR, Estimated: >60 mL/min (ref >60)
Glucose, Bld: 114 mg/dL — ABNORMAL HIGH (ref 70–99)
Potassium: 4.1 mmol/L (ref 3.5–5.1)
Sodium: 140 mmol/L (ref 135–145)
Total Bilirubin: 0.3 mg/dL (ref 0.3–1.2)
Total Protein: 6.7 g/dL (ref 6.5–8.1)

## 2022-09-20 MED ORDER — SODIUM CHLORIDE 0.9% FLUSH
10.0000 mL | Freq: Once | INTRAVENOUS | Status: AC
  Administered 2022-09-20: 10 mL

## 2022-09-20 MED ORDER — FAMOTIDINE IN NACL 20-0.9 MG/50ML-% IV SOLN
20.0000 mg | Freq: Once | INTRAVENOUS | Status: AC
  Administered 2022-09-20: 20 mg via INTRAVENOUS
  Filled 2022-09-20: qty 50

## 2022-09-20 MED ORDER — DIPHENHYDRAMINE HCL 50 MG/ML IJ SOLN
50.0000 mg | Freq: Once | INTRAMUSCULAR | Status: AC
  Administered 2022-09-20: 50 mg via INTRAVENOUS
  Filled 2022-09-20: qty 1

## 2022-09-20 MED ORDER — ACETAMINOPHEN 325 MG PO TABS
650.0000 mg | ORAL_TABLET | Freq: Once | ORAL | Status: AC
  Administered 2022-09-20: 650 mg via ORAL
  Filled 2022-09-20: qty 2

## 2022-09-20 MED ORDER — SODIUM CHLORIDE 0.9 % IV SOLN: Freq: Once | INTRAVENOUS | Status: AC

## 2022-09-20 MED ORDER — SODIUM CHLORIDE 0.9 % IV SOLN
80.0000 mg/m2 | Freq: Once | INTRAVENOUS | Status: AC
  Administered 2022-09-20: 138 mg via INTRAVENOUS
  Filled 2022-09-20: qty 23

## 2022-09-20 MED ORDER — SODIUM CHLORIDE 0.9 % IV SOLN
10.0000 mg | Freq: Once | INTRAVENOUS | Status: AC
  Administered 2022-09-20: 10 mg via INTRAVENOUS
  Filled 2022-09-20: qty 10

## 2022-09-20 MED ORDER — HEPARIN SOD (PORK) LOCK FLUSH 100 UNIT/ML IV SOLN
500.0000 [IU] | Freq: Once | INTRAVENOUS | Status: AC | PRN
  Administered 2022-09-20: 500 [IU]

## 2022-09-20 MED ORDER — TRASTUZUMAB-ANNS CHEMO 150 MG IV SOLR
2.0000 mg/kg | Freq: Once | INTRAVENOUS | Status: AC
  Administered 2022-09-20: 126 mg via INTRAVENOUS
  Filled 2022-09-20: qty 6

## 2022-09-20 MED ORDER — SODIUM CHLORIDE 0.9% FLUSH
10.0000 mL | INTRAVENOUS | Status: DC | PRN
  Administered 2022-09-20: 10 mL

## 2022-09-20 NOTE — Assessment & Plan Note (Signed)
This is a very pleasant 52 year old premenopausal female patient with newly diagnosed right breast invasive ductal carcinoma T1 N0 M0 ER/PR and HER2 amplified referred to medical oncology for recommendations.   Given triple positive disease, we have discussed about considering adjuvant chemotherapy.  She now here after surgery, final pathology showed tumor measuring 1.3 x 1.2 x 1.0 cm, negative margins, negative lymph nodes.  She is now on weekly herceptin and taxol, doing well except for fatigue. No concerning review of systems today.  Physical examination normal.  Okay to proceed with chemotherapy if all labs are within parameters. Since she is doing remarkably well, we will continue chemotherapy and she can follow-up with me every 2 weeks.   With regards to her focus and memory, we discussed that this could be from lack of sleep ( she has a new grandson who is living with her) and ongoing fatigue from treatment etc. After completion of taxol, she will continue every 21 day herceptin. ECHO ordered for end of April.  Benay Pike MD

## 2022-09-20 NOTE — [facility] Notes (Signed)
[facility] NOTE  Patient Care Team: Martinique, [name], MD as PCP - General (Family Medicine) Mauro Kaufmann, RN as Oncology Nurse Navigator Rockwell Germany, RN as Oncology Nurse Navigator Benay Pike, MD as Consulting Physician (Hematology and Oncology) Stark Klein, MD as Consulting Physician (General Surgery) Eppie Gibson, MD as Attending Physician (Radiation Oncology)  CHIEF COMPLAINTS/PURPOSE OF CONSULTATION:  Breast cancer follow up.  HISTORY OF PRESENTING ILLNESS:  Dawn Bates 52 y.o. female is here because of recent diagnosis of right breast cancer  I reviewed her records extensively and collaborated the history with the patient.  SUMMARY OF ONCOLOGIC HISTORY: Oncology History  Malignant neoplasm of upper-outer quadrant of right breast in female, estrogen receptor positive  05/26/2022 Mammogram   Diagnostic mammogram after patient noticed a palpable lump in the right breast showed suspicious mass at the palpable site of concern in the right breast at 10:00 measuring 1.2 cm. Ultrasound of the right breast showed no evidence of lymphadenopathy.   06/06/2022 Pathology Results   Right breast needle core biopsy at 10:00 showed overall grade 2 invasive ductal carcinoma.  Prognostic showed ER 100% positive strong staining PR 5% positive strong staining, Ki-67 of 50% HER2 3+ by [facility]   06/06/2022 Cancer Staging   Staging form: Breast, AJCC 8th Edition - Clinical stage from 06/06/2022: Stage IA (cT1c, cN0, cM0, G3, ER+, PR+, HER2+) - Signed by Benay Pike, MD on 07/19/2022 Stage prefix: Initial diagnosis Histologic grading system: 3 grade system   06/26/2022 Genetic Testing   Negative [facility] CancerNext Expanded Panel.  Report date is 06/26/2022.   The CancerNext-Expanded gene panel offered by [facility] and includes sequencing and rearrangement analysis for the following 77 genes: AIP, ALK, APC, ATM, AXIN2, BAP1, BARD1, BLM, BMPR1A, BRCA1,  BRCA2, BRIP1, CDC73, CDH1, CDK4, CDKN1B, CDKN2A, CHEK2, CTNNA1, DICER1, FANCC, FH, FLCN, GALNT12, KIF1B, LZTR1, MAX, MEN1, MET, MLH1, MSH2, MSH3, MSH6, MUTYH, NBN, NF1, NF2, NTHL1, PALB2, PHOX2B, PMS2, POT1, PRKAR1A, PTCH1, PTEN, RAD51C, RAD51D, RB1, RECQL, RET, SDHA, SDHAF2, SDHB, SDHC, SDHD, SMAD4, SMARCA4, SMARCB1, SMARCE1, STK11, SUFU, TMEM127, TP53, TSC1, TSC2, VHL and XRCC2 (sequencing and deletion/duplication); EGFR, EGLN1, HOXB13, KIT, MITF, PDGFRA, POLD1, and POLE (sequencing only); EPCAM and GREM1 (deletion/duplication only).    06/28/2022 Initial Diagnosis   Malignant neoplasm of upper-outer quadrant of right breast in female, estrogen receptor positive ([place])   07/05/2022 Surgery   Right breast lumpectomy: 1.3 cm invasive poorly differentiated ductal carcinoma grade 3, margins negative, 2 sentinel lymph nodes negative   07/05/2022 Cancer Staging   Staging form: Breast, AJCC 8th Edition - Pathologic stage from 07/05/2022: Stage IA (pT1c, pN0, cM0, G3, ER+, PR+, HER2+) - Signed by Gardenia Phlegm, NP on 08/02/2022 Stage prefix: Initial diagnosis Histologic grading system: 3 grade system   08/02/2022 -  Chemotherapy   Patient is on Treatment Plan : BREAST Paclitaxel + Trastuzumab q7d / Trastuzumab q21d      Patient arrived to the appointment today with her husband. She says she continues to have some blurred vision and may have to see her ophthalmologist. She says memory is bad, she has been misunderstanding, may be she is not focusing. She is also dealing with some stress, lack of sleep, helping her daughter with her 75 week old grandson. No neuropathy.  Rest of the pertinent 10 point ROS reviewed and negative.  MEDICAL HISTORY:  Past Medical History:  Diagnosis Date   Abnormal uterine bleeding 2022   Anemia 02/12/2021   Patient is  taking iron supplementation.   Cancer [facility])    breast   COVID-19 2020   cold-like symptoms   Endometriosis    Fibroids    Graves disease     Hypertension    Hyperthyroidism 2022   s/p Nuclear Medicine RAI therapy on 08/07/20   Seasonal allergies    severe chronic allergies, runny nose, congestion, cough when she lays down due to drainage   Vitamin D deficiency 2021   Wears glasses     SURGICAL HISTORY: Past Surgical History:  Procedure Laterality Date   BREAST BIOPSY Right 06/06/2022   [place] RT BREAST BX W LOC DEV 1ST LESION IMG BX SPEC US GUIDE 06/06/2022 [facility] MAMMOGRAPHY   BREAST LUMPECTOMY WITH SENTINEL LYMPH NODE BIOPSY Right 07/05/2022   Procedure: RIGHT BREAST LUMPECTOMY WITH SENTINEL LYMPH NODE BX;  Surgeon: Stark Klein, MD;  Location: [facility];  Service: General;  Laterality: Right;   CYSTOSCOPY  10/04/2021   Procedure: CYSTOSCOPY;  Surgeon: Crawford Givens, MD;  Location: [facility];  Service: Gynecology;;   LAPAROSCOPIC VAGINAL HYSTERECTOMY WITH SALPINGECTOMY N/A 10/04/2021   Procedure: LAPAROSCOPIC ASSISTED VAGINAL HYSTERECTOMY WITH SALPINGECTOMY;  Surgeon: Crawford Givens, MD;  Location: [facility];  Service: Gynecology;  Laterality: N/A;   PORTACATH PLACEMENT Left 07/05/2022   Procedure: INSERTION PORT-A-CATH;  Surgeon: Stark Klein, MD;  Location: [facility];  Service: General;  Laterality: Left;   TUBAL LIGATION  09/2008    SOCIAL HISTORY: Social History   Socioeconomic History   Marital status: Married    Spouse name: Not on file   Number of children: Not on file   Years of education: Not on file   Highest education level: Not on file  Occupational History   Not on file  Tobacco Use   Smoking status: Never   Smokeless tobacco: Never  Vaping Use   Vaping Use: Never used  Substance and Sexual Activity   Alcohol use: No   Drug use: No   Sexual activity: Yes    Partners: Male    Birth control/protection: Surgical    Comment: BTL   Other Topics Concern   Not on file  Social History Narrative   Not on file   Social Determinants of  Health   Financial Resource Strain: Not on file  Food Insecurity: Not on file  Transportation Needs: Not on file  Physical Activity: Not on file  Stress: Not on file  Social Connections: Not on file  Intimate Partner Violence: Not on file    FAMILY HISTORY: Family History  Problem Relation Age of Onset   Breast cancer Sister 59       triple negative; d. early 55s   Cancer Paternal Aunt        unknown type   Thyroid cancer Half-Sister        mat half sister; dx 38s; unknown cell type   Maternal uncle with prostate cancer, multiple myeloma.  ALLERGIES:  is allergic to latex.  MEDICATIONS:  Current Outpatient Medications  Medication Sig Dispense Refill   amLODipine (NORVASC) 5 MG tablet TAKE 1 TABLET (5 MG TOTAL) BY MOUTH DAILY. 90 tablet 1   Calcium-Phosphorus-Vitamin D (CITRACAL +D3 PO) Take by mouth.     ferrous sulfate 325 (65 FE) MG tablet Take 325 mg by mouth daily with breakfast.     lidocaine-prilocaine (EMLA) cream Apply to affected area once 30 g 3   ondansetron (ZOFRAN) 8 MG tablet Take 1 tablet (8 mg  total) by mouth every 8 (eight) hours as needed for nausea or vomiting. (Patient not taking: Reported on 08/23/2022) 30 tablet 1   oxyCODONE (OXY IR/ROXICODONE) 5 MG immediate release tablet Take 1 tablet (5 mg total) by mouth every 6 (six) hours as needed for severe pain. (Patient not taking: Reported on 08/23/2022) 8 tablet 0   prochlorperazine (COMPAZINE) 10 MG tablet Take 1 tablet (10 mg total) by mouth every 6 (six) hours as needed for nausea or vomiting. (Patient not taking: Reported on 08/23/2022) 30 tablet 1   valACYclovir (VALTREX) 500 MG tablet Take 500 mg by mouth as needed.     No current facility-administered medications for this visit.    REVIEW OF SYSTEMS:   Constitutional: Denies fevers, chills or abnormal night sweats Eyes: Denies blurriness of vision, double vision or watery eyes Ears, nose, mouth, throat, and face: Denies mucositis or sore  throat Respiratory: Denies cough, dyspnea or wheezes Cardiovascular: Denies palpitation, chest discomfort or lower extremity swelling Gastrointestinal:  Denies nausea, heartburn or change in bowel habits Skin: Denies abnormal skin rashes Lymphatics: Denies new lymphadenopathy or easy bruising Neurological:Denies numbness, tingling or new weaknesses Behavioral/Psych: Mood is stable, no new changes  Breast:  Denies any palpable lumps or discharge All other systems were reviewed with the patient and are negative.  PHYSICAL EXAMINATION: ECOG PERFORMANCE STATUS: 0 - Asymptomatic  BP 131/74 (BP Location: Left Arm, Patient Position: Sitting)   Pulse 68   Temp 97.9 F (36.6 C) (Temporal)   Resp 16   Ht 5\' 6"  (1.676 m)   Wt 138 lb 14.4 oz (63 kg)   LMP 09/09/2021 (Exact Date)   SpO2 100%   BMI 22.42 kg/m   Physical Exam Constitutional:      Appearance: Normal appearance.  Cardiovascular:     Rate and Rhythm: Normal rate and regular rhythm.  Musculoskeletal:        General: Normal range of motion.     Cervical back: Normal range of motion and neck supple. No rigidity.  Lymphadenopathy:     Cervical: No cervical adenopathy.  Skin:    General: Skin is warm and dry.  Neurological:     Mental Status: She is alert.  Psychiatric:        Mood and Affect: Mood normal.      LABORATORY DATA:  I have reviewed the data as listed Lab Results  Component Value Date   WBC 4.4 09/20/2022   HGB 9.6 (L) 09/20/2022   HCT 29.5 (L) 09/20/2022   MCV 81.9 09/20/2022   PLT 232 09/20/2022   Lab Results  Component Value Date   NA 140 09/13/2022   K 3.8 09/13/2022   CL 106 09/13/2022   CO2 31 09/13/2022    RADIOGRAPHIC STUDIES: I have personally reviewed the radiological reports and agreed with the findings in the report.  ASSESSMENT AND PLAN:  Malignant neoplasm of upper-outer quadrant of right breast in female, estrogen receptor positive ([place]) This is a very pleasant 52 year old  premenopausal female patient with newly diagnosed right breast invasive ductal carcinoma T1 N0 M0 ER/PR and HER2 amplified referred to medical oncology for recommendations.   Given triple positive disease, we have discussed about considering adjuvant chemotherapy.  She now here after surgery, final pathology showed tumor measuring 1.3 x 1.2 x 1.0 cm, negative margins, negative lymph nodes.  She is now on weekly herceptin and taxol, doing well except for fatigue. No concerning review of systems today.  Physical examination normal.  Okay  to proceed with chemotherapy if all labs are within parameters. Since she is doing remarkably well, we will continue chemotherapy and she can follow-up with me every 2 weeks.   With regards to her focus and memory, we discussed that this could be from lack of sleep ( she has a new grandson who is living with her) and ongoing fatigue from treatment etc. After completion of taxol, she will continue every 21 day herceptin. ECHO ordered for end of April.  Benay Pike MD   Total time spent: 30 minutes including history, physical exam, review of records, counseling and coordination of care All questions were answered. The patient knows to call the clinic with any problems, questions or concerns.    Benay Pike, MD 09/20/22

## 2022-09-20 NOTE — Patient Instructions (Addendum)
[facility]  Discharge Instructions: Thank you for choosing [facility] to provide your oncology and hematology care.   If you have a lab appointment with the [facility], please go directly to the [facility] and check in at the registration area.   Wear comfortable clothing and clothing appropriate for easy access to any Portacath or PICC line.   We strive to give you quality time with your provider. You may need to reschedule your appointment if you arrive late (15 or more minutes).  Arriving late affects you and other patients whose appointments are after yours.  Also, if you miss three or more appointments without notifying the office, you may be dismissed from the clinic at the provider's discretion.      For prescription refill requests, have your pharmacy contact our office and allow 72 hours for refills to be completed.    Today you received the following chemotherapy and/or immunotherapy agents: Trastuzumab ([place]inti), and Paclitaxel (Taxol).   To help prevent nausea and vomiting after your treatment, we encourage you to take your nausea medication as directed.  BELOW ARE SYMPTOMS THAT SHOULD BE REPORTED IMMEDIATELY: *FEVER GREATER THAN 100.4 F (38 C) OR HIGHER *CHILLS OR SWEATING *NAUSEA AND VOMITING THAT IS NOT CONTROLLED WITH YOUR NAUSEA MEDICATION *UNUSUAL SHORTNESS OF BREATH *UNUSUAL BRUISING OR BLEEDING *URINARY PROBLEMS (pain or burning when urinating, or frequent urination) *BOWEL PROBLEMS (unusual diarrhea, constipation, pain near the anus) TENDERNESS IN MOUTH AND THROAT WITH OR WITHOUT PRESENCE OF ULCERS (sore throat, sores in mouth, or a toothache) UNUSUAL RASH, SWELLING OR PAIN  UNUSUAL VAGINAL DISCHARGE OR ITCHING   Items with * indicate a potential emergency and should be followed up as soon as possible or go to the Emergency Department if any problems should occur.  Please show the CHEMOTHERAPY ALERT CARD  or IMMUNOTHERAPY ALERT CARD at check-in to the Emergency Department and triage nurse.  Should you have questions after your visit or need to cancel or reschedule your appointment, please contact [facility]  Dept: [phone]  and follow the prompts.  Office hours are 8:00 a.m. to 4:30 p.m. Monday - Friday. Please note that voicemails left after 4:00 p.m. may not be returned until the following business day.  We are closed weekends and major holidays. You have access to a nurse at all times for urgent questions. Please call the main number to the clinic Dept: [phone] and follow the prompts.   For any non-urgent questions, you may also contact your provider using MyChart. We now offer e-Visits for anyone 47 and older to request care online for non-urgent symptoms. For details visit [email or web address].   Also download the MyChart app! Go to the app store, search "MyChart", open the app, select [facility], and log in with your MyChart username and password.  Trastuzumab Injection What is this medication? TRASTUZUMAB (tras TOO zoo mab) treats breast cancer and stomach cancer. It works by blocking a protein that causes cancer cells to grow and multiply. This helps to slow or stop the spread of cancer cells. This medicine may be used for other purposes; ask your health care provider or pharmacist if you have questions. COMMON BRAND NAME(S): Herceptin, Janae Bridgeman, Ontruzant, Trazimera What should I tell my care team before I take this medication? They need to know if you have any of these conditions: Heart failure Lung disease An unusual or allergic reaction to trastuzumab, other  medications, foods, dyes, or preservatives Pregnant or trying to get pregnant Breast-feeding How should I use this medication? This medication is injected into a vein. It is given by your care team in a hospital or clinic setting. Talk to your care team about  the use of this medication in children. It is not approved for use in children. Overdosage: If you think you have taken too much of this medicine contact a poison control center or emergency room at once. NOTE: This medicine is only for you. Do not share this medicine with others. What if I miss a dose? Keep appointments for follow-up doses. It is important not to miss your dose. Call your care team if you are unable to keep an appointment. What may interact with this medication? Certain types of chemotherapy, such as daunorubicin, doxorubicin, epirubicin, idarubicin This list may not describe all possible interactions. Give your health care provider a list of all the medicines, herbs, non-prescription drugs, or dietary supplements you use. Also tell them if you smoke, drink alcohol, or use illegal drugs. Some items may interact with your medicine. What should I watch for while using this medication? Your condition will be monitored carefully while you are receiving this medication. This medication may make you feel generally unwell. This is not uncommon, as chemotherapy affects healthy cells as well as cancer cells. Report any side effects. Continue your course of treatment even though you feel ill unless your care team tells you to stop. This medication may increase your risk of getting an infection. Call your care team for advice if you get a fever, chills, sore throat, or other symptoms of a cold or flu. Do not treat yourself. Try to avoid being around people who are sick. Avoid taking medications that contain aspirin, acetaminophen, ibuprofen, naproxen, or ketoprofen unless instructed by your care team. These medications can hide a fever. Talk to your care team if you may be pregnant. Serious birth defects can occur if you take this medication during pregnancy and for 7 months after the last dose. You will need a negative pregnancy test before starting this medication. Contraception is recommended  while taking this medication and for 7 months after the last dose. Your care team can help you find the option that works for you. Do not breastfeed while taking this medication and for 7 months after stopping treatment. What side effects may I notice from receiving this medication? Side effects that you should report to your care team as soon as possible: Allergic reactions or angioedema--skin rash, itching or hives, swelling of the face, eyes, lips, tongue, arms, or legs, trouble swallowing or breathing Dry cough, shortness of breath or trouble breathing Heart failure--shortness of breath, swelling of the ankles, feet, or hands, sudden weight gain, unusual weakness or fatigue Infection--fever, chills, cough, or sore throat Infusion reactions--chest pain, shortness of breath or trouble breathing, feeling faint or lightheaded Side effects that usually do not require medical attention (report to your care team if they continue or are bothersome): Diarrhea Dizziness Headache Nausea Trouble sleeping Vomiting This list may not describe all possible side effects. Call your doctor for medical advice about side effects. You may report side effects to FDA at 1-800-FDA-1088. Where should I keep my medication? This medication is given in a hospital or clinic. It will not be stored at home. NOTE: This sheet is a summary. It may not cover all possible information. If you have questions about this medicine, talk to your doctor, pharmacist, or health  care provider.  2023 Elsevier/Gold Standard (2021-10-07 00:00:00) Paclitaxel Injection What is this medication? PACLITAXEL (PAK li TAX el) treats some types of cancer. It works by slowing down the growth of cancer cells. This medicine may be used for other purposes; ask your health care provider or pharmacist if you have questions. COMMON BRAND NAME(S): Onxol, Taxol What should I tell my care team before I take this medication? They need to know if you  have any of these conditions: Heart disease Liver disease Low white blood cell levels An unusual or allergic reaction to paclitaxel, other medications, foods, dyes, or preservatives If you or your partner are pregnant or trying to get pregnant Breast-feeding How should I use this medication? This medication is injected into a vein. It is given by your care team in a hospital or clinic setting. Talk to your care team about the use of this medication in children. While it may be given to children for selected conditions, precautions do apply. Overdosage: If you think you have taken too much of this medicine contact a poison control center or emergency room at once. NOTE: This medicine is only for you. Do not share this medicine with others. What if I miss a dose? Keep appointments for follow-up doses. It is important not to miss your dose. Call your care team if you are unable to keep an appointment. What may interact with this medication? Do not take this medication with any of the following: Live virus vaccines Other medications may affect the way this medication works. Talk with your care team about all of the medications you take. They may suggest changes to your treatment plan to lower the risk of side effects and to make sure your medications work as intended. This list may not describe all possible interactions. Give your health care provider a list of all the medicines, herbs, non-prescription drugs, or dietary supplements you use. Also tell them if you smoke, drink alcohol, or use illegal drugs. Some items may interact with your medicine. What should I watch for while using this medication? Your condition will be monitored carefully while you are receiving this medication. You may need blood work while taking this medication. This medication may make you feel generally unwell. This is not uncommon as chemotherapy can affect healthy cells as well as cancer cells. Report any side effects.  Continue your course of treatment even though you feel ill unless your care team tells you to stop. This medication can cause serious allergic reactions. To reduce the risk, your care team may give you other medications to take before receiving this one. Be sure to follow the directions from your care team. This medication may increase your risk of getting an infection. Call your care team for advice if you get a fever, chills, sore throat, or other symptoms of a cold or flu. Do not treat yourself. Try to avoid being around people who are sick. This medication may increase your risk to bruise or bleed. Call your care team if you notice any unusual bleeding. Be careful brushing or flossing your teeth or using a toothpick because you may get an infection or bleed more easily. If you have any dental work done, tell your dentist you are receiving this medication. Talk to your care team if you may be pregnant. Serious birth defects can occur if you take this medication during pregnancy. Talk to your care team before breastfeeding. Changes to your treatment plan may be needed. What side effects may I  notice from receiving this medication? Side effects that you should report to your care team as soon as possible: Allergic reactions--skin rash, itching, hives, swelling of the face, lips, tongue, or throat Heart rhythm changes--fast or irregular heartbeat, dizziness, feeling faint or lightheaded, chest pain, trouble breathing Increase in blood pressure Infection--fever, chills, cough, sore throat, wounds that don't heal, pain or trouble when passing urine, general feeling of discomfort or being unwell Low blood pressure--dizziness, feeling faint or lightheaded, blurry vision Low red blood cell level--unusual weakness or fatigue, dizziness, headache, trouble breathing Painful swelling, warmth, or redness of the skin, blisters or sores at the infusion site Pain, tingling, or numbness in the hands or feet Slow  heartbeat--dizziness, feeling faint or lightheaded, confusion, trouble breathing, unusual weakness or fatigue Unusual bruising or bleeding Side effects that usually do not require medical attention (report to your care team if they continue or are bothersome): Diarrhea Hair loss Joint pain Loss of appetite Muscle pain Nausea Vomiting This list may not describe all possible side effects. Call your doctor for medical advice about side effects. You may report side effects to FDA at 1-800-FDA-1088. Where should I keep my medication? This medication is given in a hospital or clinic. It will not be stored at home. NOTE: This sheet is a summary. It may not cover all possible information. If you have questions about this medicine, talk to your doctor, pharmacist, or health care provider.  2023 Elsevier/Gold Standard (2021-10-21 00:00:00)

## 2022-09-22 ENCOUNTER — Other Ambulatory Visit: Payer: Self-pay

## 2022-09-26 ENCOUNTER — Inpatient Hospital Stay: Payer: BC Managed Care – PPO

## 2022-09-26 ENCOUNTER — Other Ambulatory Visit: Payer: Self-pay

## 2022-09-26 MED FILL — Dexamethasone Sodium Phosphate Inj 100 MG/10ML: INTRAMUSCULAR | Qty: 1 | Status: AC

## 2022-09-26 NOTE — [facility] Notes (Signed)
[facility] Healthcare Advance Directives Clinical Social Work  Patient presented to Advance Directives Clinic to review healthcare advance directives.  Clinical Social Worker met with patient. Discussed her designated primary healthcare agent and secondary agent.  Reviewed healthcare living will.  She said she would like to review the document before signing.  Clinical Social Worker encouraged patient/family to contact with any additional questions or concerns.   Dorothey Baseman, LCSW Clinical Social Worker [facility]

## 2022-09-27 ENCOUNTER — Ambulatory Visit: Payer: BC Managed Care – PPO | Admitting: Hematology and Oncology

## 2022-09-27 ENCOUNTER — Inpatient Hospital Stay: Payer: BC Managed Care – PPO

## 2022-09-27 VITALS — BP 139/85 | HR 74 | Temp 98.0°F | Resp 16 | Wt 139.5 lb

## 2022-09-27 DIAGNOSIS — Z95828 Presence of other vascular implants and grafts: Secondary | ICD-10-CM

## 2022-09-27 DIAGNOSIS — C50411 Malignant neoplasm of upper-outer quadrant of right female breast: Secondary | ICD-10-CM

## 2022-09-27 DIAGNOSIS — Z5112 Encounter for antineoplastic immunotherapy: Secondary | ICD-10-CM | POA: Diagnosis not present

## 2022-09-27 LAB — CMP (CANCER CENTER ONLY)
ALT: 12 U/L (ref 0–44)
AST: 12 U/L — ABNORMAL LOW (ref 15–41)
Albumin: 3.9 g/dL (ref 3.5–5.0)
Alkaline Phosphatase: 54 U/L (ref 38–126)
Anion gap: 4 — ABNORMAL LOW (ref 5–15)
BUN: 18 mg/dL (ref 6–20)
CO2: 29 mmol/L (ref 22–32)
Calcium: 9.6 mg/dL (ref 8.9–10.3)
Chloride: 107 mmol/L (ref 98–111)
Creatinine: 0.84 mg/dL (ref 0.44–1.00)
GFR, Estimated: >60 mL/min (ref >60)
Glucose, Bld: 103 mg/dL — ABNORMAL HIGH (ref 70–99)
Potassium: 4.1 mmol/L (ref 3.5–5.1)
Sodium: 140 mmol/L (ref 135–145)
Total Bilirubin: 0.3 mg/dL (ref 0.3–1.2)
Total Protein: 6.6 g/dL (ref 6.5–8.1)

## 2022-09-27 LAB — CBC WITH DIFFERENTIAL (CANCER CENTER ONLY)
Abs Immature Granulocytes: 0.03 10*3/uL (ref 0.00–0.07)
Basophils Absolute: 0 10*3/uL (ref 0.0–0.1)
Basophils Relative: 1 %
Eosinophils Absolute: 0.1 10*3/uL (ref 0.0–0.5)
Eosinophils Relative: 2 %
HCT: 28.7 % — ABNORMAL LOW (ref 36.0–46.0)
Hemoglobin: 9.2 g/dL — ABNORMAL LOW (ref 12.0–15.0)
Immature Granulocytes: 1 %
Lymphocytes Relative: 45 %
Lymphs Abs: 1.8 10*3/uL (ref 0.7–4.0)
MCH: 26.4 pg (ref 26.0–34.0)
MCHC: 32.1 g/dL (ref 30.0–36.0)
MCV: 82.2 fL (ref 80.0–100.0)
Monocytes Absolute: 0.2 10*3/uL (ref 0.1–1.0)
Monocytes Relative: 5 %
Neutro Abs: 1.8 10*3/uL (ref 1.7–7.7)
Neutrophils Relative %: 46 %
Platelet Count: 236 10*3/uL (ref 150–400)
RBC: 3.49 MIL/uL — ABNORMAL LOW (ref 3.87–5.11)
RDW: 21 % — ABNORMAL HIGH (ref 11.5–15.5)
WBC Count: 3.9 10*3/uL — ABNORMAL LOW (ref 4.0–10.5)
nRBC: 0 % (ref 0.0–0.2)

## 2022-09-27 MED ORDER — SODIUM CHLORIDE 0.9 % IV SOLN
80.0000 mg/m2 | Freq: Once | INTRAVENOUS | Status: AC
  Administered 2022-09-27: 138 mg via INTRAVENOUS
  Filled 2022-09-27: qty 23

## 2022-09-27 MED ORDER — HEPARIN SOD (PORK) LOCK FLUSH 100 UNIT/ML IV SOLN
500.0000 [IU] | Freq: Once | INTRAVENOUS | Status: AC | PRN
  Administered 2022-09-27: 500 [IU]

## 2022-09-27 MED ORDER — FAMOTIDINE IN NACL 20-0.9 MG/50ML-% IV SOLN
20.0000 mg | Freq: Once | INTRAVENOUS | Status: AC
  Administered 2022-09-27: 20 mg via INTRAVENOUS
  Filled 2022-09-27: qty 50

## 2022-09-27 MED ORDER — SODIUM CHLORIDE 0.9% FLUSH
10.0000 mL | INTRAVENOUS | Status: DC | PRN
  Administered 2022-09-27: 10 mL

## 2022-09-27 MED ORDER — TRASTUZUMAB-ANNS CHEMO 420 MG IV SOLR
2.0000 mg/kg | Freq: Once | INTRAVENOUS | Status: AC
  Administered 2022-09-27: 126 mg via INTRAVENOUS
  Filled 2022-09-27: qty 6

## 2022-09-27 MED ORDER — ACETAMINOPHEN 325 MG PO TABS
650.0000 mg | ORAL_TABLET | Freq: Once | ORAL | Status: AC
  Administered 2022-09-27: 650 mg via ORAL
  Filled 2022-09-27: qty 2

## 2022-09-27 MED ORDER — DIPHENHYDRAMINE HCL 50 MG/ML IJ SOLN
50.0000 mg | Freq: Once | INTRAMUSCULAR | Status: AC
  Administered 2022-09-27: 50 mg via INTRAVENOUS
  Filled 2022-09-27: qty 1

## 2022-09-27 MED ORDER — SODIUM CHLORIDE 0.9 % IV SOLN
10.0000 mg | Freq: Once | INTRAVENOUS | Status: AC
  Administered 2022-09-27: 10 mg via INTRAVENOUS
  Filled 2022-09-27: qty 10

## 2022-09-27 MED ORDER — SODIUM CHLORIDE 0.9% FLUSH
10.0000 mL | Freq: Once | INTRAVENOUS | Status: AC
  Administered 2022-09-27: 10 mL

## 2022-09-27 MED ORDER — SODIUM CHLORIDE 0.9 % IV SOLN: Freq: Once | INTRAVENOUS | Status: AC

## 2022-09-27 NOTE — Patient Instructions (Signed)
[facility]  Discharge Instructions: Thank you for choosing [facility] to provide your oncology and hematology care.   If you have a lab appointment with the Cancer Center, please go directly to the Cancer Center and check in at the registration area.   Wear comfortable clothing and clothing appropriate for easy access to any Portacath or PICC line.   We strive to give you quality time with your provider. You may need to reschedule your appointment if you arrive late (15 or more minutes).  Arriving late affects you and other patients whose appointments are after yours.  Also, if you miss three or more appointments without notifying the office, you may be dismissed from the clinic at the provider's discretion.      For prescription refill requests, have your pharmacy contact our office and allow 72 hours for refills to be completed.    Today you received the following chemotherapy and/or immunotherapy agents Kanjinti & Taxol      To help prevent nausea and vomiting after your treatment, we encourage you to take your nausea medication as directed.  BELOW ARE SYMPTOMS THAT SHOULD BE REPORTED IMMEDIATELY: *FEVER GREATER THAN 100.4 F (38 C) OR HIGHER *CHILLS OR SWEATING *NAUSEA AND VOMITING THAT IS NOT CONTROLLED WITH YOUR NAUSEA MEDICATION *UNUSUAL SHORTNESS OF BREATH *UNUSUAL BRUISING OR BLEEDING *URINARY PROBLEMS (pain or burning when urinating, or frequent urination) *BOWEL PROBLEMS (unusual diarrhea, constipation, pain near the anus) TENDERNESS IN MOUTH AND THROAT WITH OR WITHOUT PRESENCE OF ULCERS (sore throat, sores in mouth, or a toothache) UNUSUAL RASH, SWELLING OR PAIN  UNUSUAL VAGINAL DISCHARGE OR ITCHING   Items with * indicate a potential emergency and should be followed up as soon as possible or go to the Emergency Department if any problems should occur.  Please show the CHEMOTHERAPY ALERT CARD or IMMUNOTHERAPY ALERT CARD at  check-in to the Emergency Department and triage nurse.  Should you have questions after your visit or need to cancel or reschedule your appointment, please contact [facility]  Dept: [phone]  and follow the prompts.  Office hours are 8:00 a.m. to 4:30 p.m. Monday - Friday. Please note that voicemails left after 4:00 p.m. may not be returned until the following business day.  We are closed weekends and major holidays. You have access to a nurse at all times for urgent questions. Please call the main number to the clinic Dept: [phone] and follow the prompts.   For any non-urgent questions, you may also contact your provider using MyChart. We now offer e-Visits for anyone 18 and older to request care online for non-urgent symptoms. For details visit [email or web address].   Also download the MyChart app! Go to the app store, search "MyChart", open the app, select [facility], and log in with your MyChart username and password.  

## 2022-09-30 ENCOUNTER — Encounter: Payer: Self-pay | Admitting: Internal Medicine

## 2022-09-30 ENCOUNTER — Ambulatory Visit (INDEPENDENT_AMBULATORY_CARE_PROVIDER_SITE_OTHER): Payer: BC Managed Care – PPO | Admitting: Internal Medicine

## 2022-09-30 VITALS — BP 128/82 | HR 86 | Ht 66.0 in | Wt 136.0 lb

## 2022-09-30 DIAGNOSIS — E890 Postprocedural hypothyroidism: Secondary | ICD-10-CM

## 2022-09-30 DIAGNOSIS — R1310 Dysphagia, unspecified: Secondary | ICD-10-CM

## 2022-09-30 DIAGNOSIS — Z8639 Personal history of other endocrine, nutritional and metabolic disease: Secondary | ICD-10-CM | POA: Diagnosis not present

## 2022-09-30 DIAGNOSIS — D509 Iron deficiency anemia, unspecified: Secondary | ICD-10-CM | POA: Insufficient documentation

## 2022-09-30 DIAGNOSIS — K5904 Chronic idiopathic constipation: Secondary | ICD-10-CM | POA: Insufficient documentation

## 2022-09-30 NOTE — Patient Instructions (Addendum)
Please continue off Levothyroxine.  If we need to restart the thyroid hormone, take this every day, with water, at least 30 minutes before breakfast, separated by at least 4 hours from: - acid reflux medications - calcium - iron - multivitamins  Please stop at the lab.  Please return in 6 months.

## 2022-09-30 NOTE — Progress Notes (Unsigned)
Patient ID: Dawn Bates, female   DOB: 1971-05-13, 52 y.o.   MRN: [id number]  HPI  Dawn Bates is a 52 y.o.-year-old female, returning for follow-up for post ablative hypothyroidism for Graves ds. and thyroid nodule.  She previously saw Dr. Everardo All, last visit with me 6 months ago.  Interim history: Since last visit, she was diagnosed with breast cancer (estrogen receptor positive). She had surgery 06/2022, ChTx, will have RxTx. BRCA negative. Sister was diagnosed with breast cancer at 50 and died from it at 50 y/o (triple negative). She otherwise feels well, does not have dysphagia anymore.  She feels that the Benadryl that she gets along with chemotherapy helps.  Reviewed and addended history: Pt. has been dx with hypothyroidism in 07/2020, after RAI treatment for Graves ds. >> on Levothyroxine 25 mcg daily. She stopped her thyroid medication at last visit, we discussed that we could do so and see if it affects her TFTs. She did not return for labs afterwards.  She was taking the thyroid hormone - fasting - with water - separated by >30 min from b'fast  - + calcium citrate in the p.m. - +  iron in the pm - no PPIs, multivitamins  - on Biotin  I reviewed pt's thyroid tests: Lab Results  Component Value Date   TSH 3.77 03/25/2022   TSH 1.85 07/28/2021   TSH 1.67 04/09/2021   TSH 0.01 (L) 02/05/2021   TSH 10.01 (H) 10/26/2020   TSH <0.01 Repeated and verified X2. (L) 06/24/2020   TSH <0.01 (L) 05/21/2020   TSH 1.27 07/31/2019   FREET4 1.1 03/25/2022   FREET4 1.0 07/28/2021   FREET4 1.0 04/09/2021   FREET4 1.5 02/05/2021   FREET4 0.8 10/26/2020   FREET4 5.22 (H) 06/24/2020   FREET4 5.7 (H) 05/21/2020   Antithyroid antibodies: No results found for: "THGAB" No components found for: "TPOAB"  Pt  mentioned: - no weight gain - + fatigue - chronic, improved after RAI tx - + cold intolerance, now hot flushes - no depression or anxiety - + constipation - +  dry skin - + hair loss/thinning  She had a very small thyroid nodule diagnosed in 2021.  On the latest ultrasound, this has decreased in size: Thyroid U/S (06/08/2020): Parenchymal Echotexture: Moderately heterogenous gland appears diffusely hyperemic (representative images 4, 7 and 21).  Isthmus: Normal in size measuring 0.7 cm in diameter  Right lobe: Normal in size measuring 4.4 x 1.8 x 1.5 cm  Left lobe: Normal in size measuring 4.4 x 1.7 x 1.6 cm Slight interval decreased conspicuity of previously visualized 0.6 cm solid nodule in the left inferior thyroid. No new discrete nodules.   IMPRESSION: Similar appearing diffuse thyroid parenchymal heterogeneity with interval decreased hyperemia from August 22, 2019 comparison. No new discrete nodules.  Uptake and scan (07/15/2020): Findings thyroid imaging.  4 hour I-123 uptake = 76.9% (normal 5-20%)  24 hour I-123 uptake = 51% (normal 10-30%)   IMPRESSION: Imaging findings, iodine uptake and TSH suppression all consistent with Graves disease.  RAI treatment (08/07/2020)  Barium swallow (06/07/2022): Initial barium swallows demonstrate normal pharyngeal motion with swallowing. No laryngeal penetration or aspiration. No upper esophageal webs, strictures or diverticuli.   Normal esophageal motility. Normal mucosal folds. No intrinsic or extrinsic lesions are identified. No hiatal hernia or GE reflux demonstrated.   The 13 mm barium pill passed into the stomach without difficulty.   IMPRESSION: 1. Normal esophageal motility. 2. No hiatal hernia or GE reflux  demonstrated. 3. No esophageal mass or stricture.  Pt denies feeling nodules in neck, hoarseness, odynophagia.  Last visit she had dysphagia -occasionally difficult to even swallow saliva.  She does have a history of allergy and has postnasal drip.  She has + FH of thyroid disorders in - older sister:  thyroid cancer.  No h/o radiation tx to head or neck  - other than RAI  tx. No use of iodine supplements.  Pt. also has a history of hysterectomy in 09/2021 for DUB.  ROS: + See HPI  Past Medical History:  Diagnosis Date   Abnormal uterine bleeding 2022   Anemia 02/12/2021   Patient is taking iron supplementation.   Cancer [facility])    breast   COVID-19 2020   cold-like symptoms   Endometriosis    Fibroids    Graves disease    Hypertension    Hyperthyroidism 2022   s/p Nuclear Medicine RAI therapy on 08/07/20   Seasonal allergies    severe chronic allergies, runny nose, congestion, cough when she lays down due to drainage   Vitamin D deficiency 2021   Wears glasses    Past Surgical History:  Procedure Laterality Date   BREAST BIOPSY Right 06/06/2022   [place] RT BREAST BX W LOC DEV 1ST LESION IMG BX SPEC US GUIDE 06/06/2022 [facility] MAMMOGRAPHY   BREAST LUMPECTOMY WITH SENTINEL LYMPH NODE BIOPSY Right 07/05/2022   Procedure: RIGHT BREAST LUMPECTOMY WITH SENTINEL LYMPH NODE BX;  Surgeon: Almond Lint, MD;  Location: [facility];  Service: General;  Laterality: Right;   CYSTOSCOPY  10/04/2021   Procedure: CYSTOSCOPY;  Surgeon: Jaymes Graff, MD;  Location: [facility];  Service: Gynecology;;   LAPAROSCOPIC VAGINAL HYSTERECTOMY WITH SALPINGECTOMY N/A 10/04/2021   Procedure: LAPAROSCOPIC ASSISTED VAGINAL HYSTERECTOMY WITH SALPINGECTOMY;  Surgeon: Jaymes Graff, MD;  Location: [facility];  Service: Gynecology;  Laterality: N/A;   PORTACATH PLACEMENT Left 07/05/2022   Procedure: INSERTION PORT-A-CATH;  Surgeon: Almond Lint, MD;  Location: [facility];  Service: General;  Laterality: Left;   TUBAL LIGATION  09/2008   Social History   Socioeconomic History   Marital status: Married    Spouse name: Not on file   Number of children: Not on file   Years of education: Not on file   Highest education level: Not on file  Occupational History   Not on file  Tobacco Use   Smoking status: Never    Smokeless tobacco: Never  Vaping Use   Vaping Use: Never used  Substance and Sexual Activity   Alcohol use: No   Drug use: No   Sexual activity: Yes    Partners: Male    Birth control/protection: Surgical    Comment: BTL   Other Topics Concern   Not on file  Social History Narrative   Not on file   Social Determinants of Health   Financial Resource Strain: Not on file  Food Insecurity: Not on file  Transportation Needs: Not on file  Physical Activity: Not on file  Stress: Not on file  Social Connections: Not on file  Intimate Partner Violence: Not on file   Current Outpatient Medications on File Prior to Visit  Medication Sig Dispense Refill   amLODipine (NORVASC) 5 MG tablet TAKE 1 TABLET (5 MG TOTAL) BY MOUTH DAILY. 90 tablet 1   Calcium-Phosphorus-Vitamin D (CITRACAL +D3 PO) Take by mouth.     ferrous sulfate 325 (65 FE) MG tablet Take 325 mg by  mouth daily with breakfast.     lidocaine-prilocaine (EMLA) cream Apply to affected area once 30 g 3   ondansetron (ZOFRAN) 8 MG tablet Take 1 tablet (8 mg total) by mouth every 8 (eight) hours as needed for nausea or vomiting. (Patient not taking: Reported on 08/23/2022) 30 tablet 1   oxyCODONE (OXY IR/ROXICODONE) 5 MG immediate release tablet Take 1 tablet (5 mg total) by mouth every 6 (six) hours as needed for severe pain. (Patient not taking: Reported on 08/23/2022) 8 tablet 0   prochlorperazine (COMPAZINE) 10 MG tablet Take 1 tablet (10 mg total) by mouth every 6 (six) hours as needed for nausea or vomiting. (Patient not taking: Reported on 08/23/2022) 30 tablet 1   valACYclovir (VALTREX) 500 MG tablet Take 500 mg by mouth as needed.     No current facility-administered medications on file prior to visit.   Allergies  Allergen Reactions   Latex Itching   Family History  Problem Relation Age of Onset   Breast cancer Sister 19       triple negative; d. early 72s   Cancer Paternal Aunt        unknown type   Thyroid cancer  Half-Sister        mat half sister; dx 50s; unknown cell type   PE: BP 128/82   Pulse 86   Ht  (1.676 m)   Wt 136 lb (61.7 kg)   LMP 09/09/2021 (Exact Date)   SpO2 98%   BMI 21.95 kg/m  Wt Readings from Last 3 Encounters:  09/30/22 136 lb (61.7 kg)  09/27/22 139 lb 8 oz (63.3 kg)  09/20/22 138 lb 14.4 oz (63 kg)   Constitutional: normal weight, in NAD Eyes:  EOMI, no exophthalmos ENT: no neck masses, no cervical lymphadenopathy Cardiovascular: RRR, No MRG Respiratory: CTA B Musculoskeletal: no deformities Skin:no rashes Neurological: no tremor with outstretched hands  ASSESSMENT: 1. Postablative Hypothyroidism - h/o Graves ds.  2.  Thyroid nodule  PLAN:  1. Patient with longstanding hypothyroidism, on low-dose levothyroxine therapy. - latest thyroid labs reviewed with pt. >> normal: Lab Results  Component Value Date   TSH 3.77 03/25/2022  - she was on LT4 25 mcg daily -but misunderstood instructions and stopped at last visit.  However, we did discuss at the time of the visit that for such a low-dose of levothyroxine, we may be able to stop it if the tests are normal.  She did not return for test afterwards, however. - pt feels good on this dose. She lost 11 lbs in last year. - we discussed about taking the thyroid hormone every day, in case she needed to start it back: With water, >30 minutes before breakfast, separated by >4 hours from acid reflux medications, calcium, iron, multivitamins. - will check thyroid tests today: TSH, fT3 and fT4 - If labs are abnormal, she will need to return for repeat TFTs in 1.5 months - OTW, I will see her back in 6 months  2.  Thyroid nodule -No masses felt on palpation of her neck today -She had a thyroid ultrasound in 05/2020.  This showed heterogeneity of the thyroid, with decreasing hyperemia and a left thyroid nodule that was decreased in size, measuring less than 0.6 cm -At last visit, she described dysphagia, which I  suspected was related to postnasal drip, but she was insistent that this was related to her thyroid so we ended up obtaining an esophageal barium swallow on 06/07/2022.  As expected, this  was entirely normal. -Since last visit, her dysphagia resolved and was particularly helped by antihistaminic that she received with chemotherapy, confirming the allergic etiology -At this visit, we discussed that her thyroid nodule was very small, and unlikely to cause any problems swallowing.  The thyroid lobes were also not enlarged on the last ultrasound -No further investigation is needed for this.  Carlus Pavlov, MD PhD [facility] Endocrinology

## 2022-10-01 LAB — T4, FREE: Free T4: 1.2 ng/dL (ref 0.8–1.8)

## 2022-10-01 LAB — TSH: TSH: 3.97 mIU/L

## 2022-10-01 LAB — T3, FREE: T3, Free: 3.5 pg/mL (ref 2.3–4.2)

## 2022-10-02 ENCOUNTER — Other Ambulatory Visit: Payer: Self-pay

## 2022-10-03 MED ORDER — LEVOTHYROXINE SODIUM 50 MCG PO TABS: 50.0000 ug | ORAL_TABLET | Freq: Every day | ORAL | 3 refills | Status: DC

## 2022-10-03 MED FILL — Dexamethasone Sodium Phosphate Inj 100 MG/10ML: INTRAMUSCULAR | Qty: 1 | Status: AC

## 2022-10-04 ENCOUNTER — Inpatient Hospital Stay: Payer: BC Managed Care – PPO

## 2022-10-04 ENCOUNTER — Other Ambulatory Visit: Payer: Self-pay

## 2022-10-04 ENCOUNTER — Inpatient Hospital Stay (HOSPITAL_BASED_OUTPATIENT_CLINIC_OR_DEPARTMENT_OTHER): Payer: BC Managed Care – PPO | Admitting: Hematology and Oncology

## 2022-10-04 DIAGNOSIS — C50411 Malignant neoplasm of upper-outer quadrant of right female breast: Secondary | ICD-10-CM

## 2022-10-04 DIAGNOSIS — Z95828 Presence of other vascular implants and grafts: Secondary | ICD-10-CM | POA: Diagnosis not present

## 2022-10-04 DIAGNOSIS — Z5112 Encounter for antineoplastic immunotherapy: Secondary | ICD-10-CM | POA: Diagnosis not present

## 2022-10-04 DIAGNOSIS — Z170 Estrogen receptor positive status [ER+]: Secondary | ICD-10-CM | POA: Diagnosis not present

## 2022-10-04 LAB — CMP (CANCER CENTER ONLY)
ALT: 12 U/L (ref 0–44)
AST: 13 U/L — ABNORMAL LOW (ref 15–41)
Albumin: 4 g/dL (ref 3.5–5.0)
Alkaline Phosphatase: 50 U/L (ref 38–126)
Anion gap: 3 — ABNORMAL LOW (ref 5–15)
BUN: 17 mg/dL (ref 6–20)
CO2: 30 mmol/L (ref 22–32)
Calcium: 9.6 mg/dL (ref 8.9–10.3)
Chloride: 107 mmol/L (ref 98–111)
Creatinine: 0.77 mg/dL (ref 0.44–1.00)
GFR, Estimated: >60 mL/min (ref >60)
Glucose, Bld: 95 mg/dL (ref 70–99)
Potassium: 3.9 mmol/L (ref 3.5–5.1)
Sodium: 140 mmol/L (ref 135–145)
Total Bilirubin: 0.3 mg/dL (ref 0.3–1.2)
Total Protein: 6.6 g/dL (ref 6.5–8.1)

## 2022-10-04 LAB — CBC WITH DIFFERENTIAL (CANCER CENTER ONLY)
Abs Immature Granulocytes: 0.03 10*3/uL (ref 0.00–0.07)
Basophils Absolute: 0 10*3/uL (ref 0.0–0.1)
Basophils Relative: 0 %
Eosinophils Absolute: 0.1 10*3/uL (ref 0.0–0.5)
Eosinophils Relative: 3 %
HCT: 28.7 % — ABNORMAL LOW (ref 36.0–46.0)
Hemoglobin: 9.3 g/dL — ABNORMAL LOW (ref 12.0–15.0)
Immature Granulocytes: 1 %
Lymphocytes Relative: 44 %
Lymphs Abs: 1.7 10*3/uL (ref 0.7–4.0)
MCH: 26.9 pg (ref 26.0–34.0)
MCHC: 32.4 g/dL (ref 30.0–36.0)
MCV: 82.9 fL (ref 80.0–100.0)
Monocytes Absolute: 0.2 10*3/uL (ref 0.1–1.0)
Monocytes Relative: 4 %
Neutro Abs: 1.9 10*3/uL (ref 1.7–7.7)
Neutrophils Relative %: 48 %
Platelet Count: 226 10*3/uL (ref 150–400)
RBC: 3.46 MIL/uL — ABNORMAL LOW (ref 3.87–5.11)
RDW: 20.8 % — ABNORMAL HIGH (ref 11.5–15.5)
WBC Count: 3.9 10*3/uL — ABNORMAL LOW (ref 4.0–10.5)
nRBC: 0 % (ref 0.0–0.2)

## 2022-10-04 MED ORDER — DIPHENHYDRAMINE HCL 50 MG/ML IJ SOLN
50.0000 mg | Freq: Once | INTRAMUSCULAR | Status: AC
  Administered 2022-10-04: 50 mg via INTRAVENOUS
  Filled 2022-10-04: qty 1

## 2022-10-04 MED ORDER — SODIUM CHLORIDE 0.9 % IV SOLN: Freq: Once | INTRAVENOUS | Status: AC

## 2022-10-04 MED ORDER — SODIUM CHLORIDE 0.9% FLUSH
10.0000 mL | Freq: Once | INTRAVENOUS | Status: AC
  Administered 2022-10-04: 10 mL

## 2022-10-04 MED ORDER — SODIUM CHLORIDE 0.9% FLUSH
10.0000 mL | INTRAVENOUS | Status: DC | PRN
  Administered 2022-10-04: 10 mL

## 2022-10-04 MED ORDER — TRASTUZUMAB-ANNS CHEMO 150 MG IV SOLR
2.0000 mg/kg | Freq: Once | INTRAVENOUS | Status: AC
  Administered 2022-10-04: 126 mg via INTRAVENOUS
  Filled 2022-10-04: qty 6

## 2022-10-04 MED ORDER — FAMOTIDINE IN NACL 20-0.9 MG/50ML-% IV SOLN
20.0000 mg | Freq: Once | INTRAVENOUS | Status: AC
  Administered 2022-10-04: 20 mg via INTRAVENOUS
  Filled 2022-10-04: qty 50

## 2022-10-04 MED ORDER — HEPARIN SOD (PORK) LOCK FLUSH 100 UNIT/ML IV SOLN
500.0000 [IU] | Freq: Once | INTRAVENOUS | Status: AC | PRN
  Administered 2022-10-04: 500 [IU]

## 2022-10-04 MED ORDER — SODIUM CHLORIDE 0.9 % IV SOLN
10.0000 mg | Freq: Once | INTRAVENOUS | Status: AC
  Administered 2022-10-04: 10 mg via INTRAVENOUS
  Filled 2022-10-04: qty 10

## 2022-10-04 MED ORDER — SODIUM CHLORIDE 0.9 % IV SOLN
80.0000 mg/m2 | Freq: Once | INTRAVENOUS | Status: AC
  Administered 2022-10-04: 138 mg via INTRAVENOUS
  Filled 2022-10-04: qty 23

## 2022-10-04 MED ORDER — ACETAMINOPHEN 325 MG PO TABS
650.0000 mg | ORAL_TABLET | Freq: Once | ORAL | Status: AC
  Administered 2022-10-04: 650 mg via ORAL
  Filled 2022-10-04: qty 2

## 2022-10-04 NOTE — Assessment & Plan Note (Addendum)
This is a very pleasant 51 year old premenopausal female patient with newly diagnosed right breast invasive ductal carcinoma T1 N0 M0 ER/PR and HER2 amplified referred to medical oncology for recommendations.   Given triple positive disease, we have discussed about considering adjuvant chemotherapy.  She now here after surgery, final pathology showed tumor measuring 1.3 x 1.2 x 1.0 cm, negative margins, negative lymph nodes.  She is now on weekly herceptin and taxol, doing well except for fatigue and some positional dizziness. No concerns on PE. Labs CBC reviewed, satisfactory to proceed with evaluation FU in 2 weeks with me. Echo scheduled for end of this month. I repeated several times that she will continue herceptin after she completes chemotherapy for a whole yr. She appeared to have an understanding.  [name] MD
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
## 2022-10-04 NOTE — Patient Instructions (Signed)
[facility]  Discharge Instructions: Thank you for choosing [facility] to provide your oncology and hematology care.   If you have a lab appointment with the Cancer Center, please go directly to the Cancer Center and check in at the registration area.   Wear comfortable clothing and clothing appropriate for easy access to any Portacath or PICC line.   We strive to give you quality time with your provider. You may need to reschedule your appointment if you arrive late (15 or more minutes).  Arriving late affects you and other patients whose appointments are after yours.  Also, if you miss three or more appointments without notifying the office, you may be dismissed from the clinic at the provider's discretion.      For prescription refill requests, have your pharmacy contact our office and allow 72 hours for refills to be completed.    Today you received the following chemotherapy and/or immunotherapy agents: Kanjinti, Paclitaxel.       To help prevent nausea and vomiting after your treatment, we encourage you to take your nausea medication as directed.  BELOW ARE SYMPTOMS THAT SHOULD BE REPORTED IMMEDIATELY: *FEVER GREATER THAN 100.4 F (38 C) OR HIGHER *CHILLS OR SWEATING *NAUSEA AND VOMITING THAT IS NOT CONTROLLED WITH YOUR NAUSEA MEDICATION *UNUSUAL SHORTNESS OF BREATH *UNUSUAL BRUISING OR BLEEDING *URINARY PROBLEMS (pain or burning when urinating, or frequent urination) *BOWEL PROBLEMS (unusual diarrhea, constipation, pain near the anus) TENDERNESS IN MOUTH AND THROAT WITH OR WITHOUT PRESENCE OF ULCERS (sore throat, sores in mouth, or a toothache) UNUSUAL RASH, SWELLING OR PAIN  UNUSUAL VAGINAL DISCHARGE OR ITCHING   Items with * indicate a potential emergency and should be followed up as soon as possible or go to the Emergency Department if any problems should occur.  Please show the CHEMOTHERAPY ALERT CARD or IMMUNOTHERAPY ALERT  CARD at check-in to the Emergency Department and triage nurse.  Should you have questions after your visit or need to cancel or reschedule your appointment, please contact [facility]  Dept: [phone]  and follow the prompts.  Office hours are 8:00 a.m. to 4:30 p.m. Monday - Friday. Please note that voicemails left after 4:00 p.m. may not be returned until the following business day.  We are closed weekends and major holidays. You have access to a nurse at all times for urgent questions. Please call the main number to the clinic Dept: [phone] and follow the prompts.   For any non-urgent questions, you may also contact your provider using MyChart. We now offer e-Visits for anyone 18 and older to request care online for non-urgent symptoms. For details visit [email or web address].   Also download the MyChart app! Go to the app store, search "MyChart", open the app, select [facility], and log in with your MyChart username and password.   

## 2022-10-07 ENCOUNTER — Encounter: Payer: Self-pay | Admitting: Genetic Counselor

## 2022-10-10 ENCOUNTER — Encounter: Payer: Self-pay | Admitting: *Deleted

## 2022-10-10 DIAGNOSIS — Z170 Estrogen receptor positive status [ER+]: Secondary | ICD-10-CM

## 2022-10-10 MED FILL — Dexamethasone Sodium Phosphate Inj 100 MG/10ML: INTRAMUSCULAR | Qty: 1 | Status: AC

## 2022-10-11 ENCOUNTER — Other Ambulatory Visit: Payer: Self-pay

## 2022-10-11 ENCOUNTER — Inpatient Hospital Stay: Payer: BC Managed Care – PPO

## 2022-10-11 ENCOUNTER — Inpatient Hospital Stay: Payer: BC Managed Care – PPO | Admitting: Hematology and Oncology

## 2022-10-11 VITALS — BP 126/77 | HR 77 | Temp 98.3°F | Resp 16 | Wt 139.4 lb

## 2022-10-11 DIAGNOSIS — Z5112 Encounter for antineoplastic immunotherapy: Secondary | ICD-10-CM | POA: Diagnosis not present

## 2022-10-11 DIAGNOSIS — Z95828 Presence of other vascular implants and grafts: Secondary | ICD-10-CM

## 2022-10-11 DIAGNOSIS — C50411 Malignant neoplasm of upper-outer quadrant of right female breast: Secondary | ICD-10-CM

## 2022-10-11 LAB — CBC WITH DIFFERENTIAL (CANCER CENTER ONLY)
Abs Immature Granulocytes: 0.02 10*3/uL (ref 0.00–0.07)
Basophils Absolute: 0 10*3/uL (ref 0.0–0.1)
Basophils Relative: 1 %
Eosinophils Absolute: 0.1 10*3/uL (ref 0.0–0.5)
Eosinophils Relative: 2 %
HCT: 29.2 % — ABNORMAL LOW (ref 36.0–46.0)
Hemoglobin: 9.5 g/dL — ABNORMAL LOW (ref 12.0–15.0)
Immature Granulocytes: 1 %
Lymphocytes Relative: 38 %
Lymphs Abs: 1.4 10*3/uL (ref 0.7–4.0)
MCH: 26.8 pg (ref 26.0–34.0)
MCHC: 32.5 g/dL (ref 30.0–36.0)
MCV: 82.5 fL (ref 80.0–100.0)
Monocytes Absolute: 0.2 10*3/uL (ref 0.1–1.0)
Monocytes Relative: 5 %
Neutro Abs: 2.1 10*3/uL (ref 1.7–7.7)
Neutrophils Relative %: 53 %
Platelet Count: 249 10*3/uL (ref 150–400)
RBC: 3.54 MIL/uL — ABNORMAL LOW (ref 3.87–5.11)
RDW: 20.9 % — ABNORMAL HIGH (ref 11.5–15.5)
WBC Count: 3.8 10*3/uL — ABNORMAL LOW (ref 4.0–10.5)
nRBC: 0 % (ref 0.0–0.2)

## 2022-10-11 LAB — CMP (CANCER CENTER ONLY)
ALT: 13 U/L (ref 0–44)
AST: 14 U/L — ABNORMAL LOW (ref 15–41)
Albumin: 4 g/dL (ref 3.5–5.0)
Alkaline Phosphatase: 51 U/L (ref 38–126)
Anion gap: 4 — ABNORMAL LOW (ref 5–15)
BUN: 16 mg/dL (ref 6–20)
CO2: 30 mmol/L (ref 22–32)
Calcium: 9.8 mg/dL (ref 8.9–10.3)
Chloride: 105 mmol/L (ref 98–111)
Creatinine: 0.77 mg/dL (ref 0.44–1.00)
GFR, Estimated: >60 mL/min (ref >60)
Glucose, Bld: 101 mg/dL — ABNORMAL HIGH (ref 70–99)
Potassium: 3.9 mmol/L (ref 3.5–5.1)
Sodium: 139 mmol/L (ref 135–145)
Total Bilirubin: 0.3 mg/dL (ref 0.3–1.2)
Total Protein: 6.6 g/dL (ref 6.5–8.1)

## 2022-10-11 MED ORDER — TRASTUZUMAB-ANNS CHEMO 150 MG IV SOLR
2.0000 mg/kg | Freq: Once | INTRAVENOUS | Status: AC
  Administered 2022-10-11: 126 mg via INTRAVENOUS
  Filled 2022-10-11: qty 6

## 2022-10-11 MED ORDER — SODIUM CHLORIDE 0.9 % IV SOLN
80.0000 mg/m2 | Freq: Once | INTRAVENOUS | Status: AC
  Administered 2022-10-11: 138 mg via INTRAVENOUS
  Filled 2022-10-11: qty 23

## 2022-10-11 MED ORDER — ACETAMINOPHEN 325 MG PO TABS
650.0000 mg | ORAL_TABLET | Freq: Once | ORAL | Status: AC
  Administered 2022-10-11: 650 mg via ORAL
  Filled 2022-10-11: qty 2

## 2022-10-11 MED ORDER — SODIUM CHLORIDE 0.9% FLUSH
10.0000 mL | Freq: Once | INTRAVENOUS | Status: AC
  Administered 2022-10-11: 10 mL

## 2022-10-11 MED ORDER — SODIUM CHLORIDE 0.9 % IV SOLN: Freq: Once | INTRAVENOUS | Status: AC

## 2022-10-11 MED ORDER — DIPHENHYDRAMINE HCL 50 MG/ML IJ SOLN
50.0000 mg | Freq: Once | INTRAMUSCULAR | Status: AC
  Administered 2022-10-11: 50 mg via INTRAVENOUS
  Filled 2022-10-11: qty 1

## 2022-10-11 MED ORDER — HEPARIN SOD (PORK) LOCK FLUSH 100 UNIT/ML IV SOLN
500.0000 [IU] | Freq: Once | INTRAVENOUS | Status: AC | PRN
  Administered 2022-10-11: 500 [IU]

## 2022-10-11 MED ORDER — FAMOTIDINE IN NACL 20-0.9 MG/50ML-% IV SOLN
20.0000 mg | Freq: Once | INTRAVENOUS | Status: AC
  Administered 2022-10-11: 20 mg via INTRAVENOUS
  Filled 2022-10-11: qty 50

## 2022-10-11 MED ORDER — SODIUM CHLORIDE 0.9 % IV SOLN
10.0000 mg | Freq: Once | INTRAVENOUS | Status: AC
  Administered 2022-10-11: 10 mg via INTRAVENOUS
  Filled 2022-10-11: qty 10

## 2022-10-11 MED ORDER — SODIUM CHLORIDE 0.9% FLUSH
10.0000 mL | INTRAVENOUS | Status: DC | PRN
  Administered 2022-10-11: 10 mL

## 2022-10-11 NOTE — Patient Instructions (Signed)
[facility]  Discharge Instructions: Thank you for choosing [facility] to provide your oncology and hematology care.   If you have a lab appointment with the Cancer Center, please go directly to the Cancer Center and check in at the registration area.   Wear comfortable clothing and clothing appropriate for easy access to any Portacath or PICC line.   We strive to give you quality time with your provider. You may need to reschedule your appointment if you arrive late (15 or more minutes).  Arriving late affects you and other patients whose appointments are after yours.  Also, if you miss three or more appointments without notifying the office, you may be dismissed from the clinic at the provider's discretion.      For prescription refill requests, have your pharmacy contact our office and allow 72 hours for refills to be completed.    Today you received the following chemotherapy and/or immunotherapy agents: Kanjinti, Paclitaxel.       To help prevent nausea and vomiting after your treatment, we encourage you to take your nausea medication as directed.  BELOW ARE SYMPTOMS THAT SHOULD BE REPORTED IMMEDIATELY: *FEVER GREATER THAN 100.4 F (38 C) OR HIGHER *CHILLS OR SWEATING *NAUSEA AND VOMITING THAT IS NOT CONTROLLED WITH YOUR NAUSEA MEDICATION *UNUSUAL SHORTNESS OF BREATH *UNUSUAL BRUISING OR BLEEDING *URINARY PROBLEMS (pain or burning when urinating, or frequent urination) *BOWEL PROBLEMS (unusual diarrhea, constipation, pain near the anus) TENDERNESS IN MOUTH AND THROAT WITH OR WITHOUT PRESENCE OF ULCERS (sore throat, sores in mouth, or a toothache) UNUSUAL RASH, SWELLING OR PAIN  UNUSUAL VAGINAL DISCHARGE OR ITCHING   Items with * indicate a potential emergency and should be followed up as soon as possible or go to the Emergency Department if any problems should occur.  Please show the CHEMOTHERAPY ALERT CARD or IMMUNOTHERAPY ALERT  CARD at check-in to the Emergency Department and triage nurse.  Should you have questions after your visit or need to cancel or reschedule your appointment, please contact [facility]  Dept: [phone]  and follow the prompts.  Office hours are 8:00 a.m. to 4:30 p.m. Monday - Friday. Please note that voicemails left after 4:00 p.m. may not be returned until the following business day.  We are closed weekends and major holidays. You have access to a nurse at all times for urgent questions. Please call the main number to the clinic Dept: [phone] and follow the prompts.   For any non-urgent questions, you may also contact your provider using MyChart. We now offer e-Visits for anyone 18 and older to request care online for non-urgent symptoms. For details visit [email or web address].   Also download the MyChart app! Go to the app store, search "MyChart", open the app, select [place], and log in with your MyChart username and password.   

## 2022-10-17 ENCOUNTER — Ambulatory Visit (HOSPITAL_COMMUNITY)
Admission: RE | Admit: 2022-10-17 | Discharge: 2022-10-17 | Disposition: A | Discharge status: Home / Self Care | Payer: BC Managed Care – PPO | Source: Ambulatory Visit | Attending: Hematology and Oncology | Admitting: Hematology and Oncology

## 2022-10-17 DIAGNOSIS — Z170 Estrogen receptor positive status [ER+]: Secondary | ICD-10-CM

## 2022-10-17 DIAGNOSIS — I10 Essential (primary) hypertension: Secondary | ICD-10-CM | POA: Diagnosis not present

## 2022-10-17 DIAGNOSIS — E0500 Thyrotoxicosis with diffuse goiter without thyrotoxic crisis or storm: Secondary | ICD-10-CM | POA: Insufficient documentation

## 2022-10-17 DIAGNOSIS — C50411 Malignant neoplasm of upper-outer quadrant of right female breast: Secondary | ICD-10-CM | POA: Diagnosis present

## 2022-10-17 DIAGNOSIS — Z0189 Encounter for other specified special examinations: Secondary | ICD-10-CM

## 2022-10-17 LAB — ECHOCARDIOGRAM COMPLETE
Area-P 1/2: 2.5 cm2
MV M vel: 3.48 m/s
MV Peak grad: 48.4 mmHg
MV VTI: 3.85 cm2
S' Lateral: 2.9 cm

## 2022-10-17 MED FILL — Dexamethasone Sodium Phosphate Inj 100 MG/10ML: INTRAMUSCULAR | Qty: 1 | Status: AC

## 2022-10-17 NOTE — Progress Notes (Signed)
  Echocardiogram 2D Echocardiogram has been performed.  Dawn Bates 10/17/2022, 12:50 PM

## 2022-10-18 ENCOUNTER — Ambulatory Visit: Payer: BC Managed Care – PPO

## 2022-10-18 ENCOUNTER — Inpatient Hospital Stay (HOSPITAL_BASED_OUTPATIENT_CLINIC_OR_DEPARTMENT_OTHER): Payer: BC Managed Care – PPO | Admitting: Hematology and Oncology

## 2022-10-18 ENCOUNTER — Inpatient Hospital Stay: Payer: BC Managed Care – PPO

## 2022-10-18 ENCOUNTER — Other Ambulatory Visit: Payer: Self-pay

## 2022-10-18 VITALS — BP 129/87 | HR 71 | Resp 18

## 2022-10-18 DIAGNOSIS — Z95828 Presence of other vascular implants and grafts: Secondary | ICD-10-CM

## 2022-10-18 DIAGNOSIS — C50411 Malignant neoplasm of upper-outer quadrant of right female breast: Secondary | ICD-10-CM

## 2022-10-18 DIAGNOSIS — Z170 Estrogen receptor positive status [ER+]: Secondary | ICD-10-CM | POA: Diagnosis not present

## 2022-10-18 DIAGNOSIS — Z5112 Encounter for antineoplastic immunotherapy: Secondary | ICD-10-CM | POA: Diagnosis not present

## 2022-10-18 LAB — CBC WITH DIFFERENTIAL (CANCER CENTER ONLY)
Abs Immature Granulocytes: 0.02 10*3/uL (ref 0.00–0.07)
Basophils Absolute: 0 10*3/uL (ref 0.0–0.1)
Basophils Relative: 1 %
Eosinophils Absolute: 0.1 10*3/uL (ref 0.0–0.5)
Eosinophils Relative: 1 %
HCT: 28.9 % — ABNORMAL LOW (ref 36.0–46.0)
Hemoglobin: 9.2 g/dL — ABNORMAL LOW (ref 12.0–15.0)
Immature Granulocytes: 1 %
Lymphocytes Relative: 37 %
Lymphs Abs: 1.3 10*3/uL (ref 0.7–4.0)
MCH: 27.1 pg (ref 26.0–34.0)
MCHC: 31.8 g/dL (ref 30.0–36.0)
MCV: 85.3 fL (ref 80.0–100.0)
Monocytes Absolute: 0.2 10*3/uL (ref 0.1–1.0)
Monocytes Relative: 6 %
Neutro Abs: 2 10*3/uL (ref 1.7–7.7)
Neutrophils Relative %: 54 %
Platelet Count: 228 10*3/uL (ref 150–400)
RBC: 3.39 MIL/uL — ABNORMAL LOW (ref 3.87–5.11)
RDW: 20.5 % — ABNORMAL HIGH (ref 11.5–15.5)
WBC Count: 3.6 10*3/uL — ABNORMAL LOW (ref 4.0–10.5)
nRBC: 0 % (ref 0.0–0.2)

## 2022-10-18 LAB — CMP (CANCER CENTER ONLY)
ALT: 11 U/L (ref 0–44)
AST: 13 U/L — ABNORMAL LOW (ref 15–41)
Albumin: 4 g/dL (ref 3.5–5.0)
Alkaline Phosphatase: 51 U/L (ref 38–126)
Anion gap: 4 — ABNORMAL LOW (ref 5–15)
BUN: 22 mg/dL — ABNORMAL HIGH (ref 6–20)
CO2: 28 mmol/L (ref 22–32)
Calcium: 9.5 mg/dL (ref 8.9–10.3)
Chloride: 107 mmol/L (ref 98–111)
Creatinine: 0.83 mg/dL (ref 0.44–1.00)
GFR, Estimated: >60 mL/min (ref >60)
Glucose, Bld: 94 mg/dL (ref 70–99)
Potassium: 3.9 mmol/L (ref 3.5–5.1)
Sodium: 139 mmol/L (ref 135–145)
Total Bilirubin: 0.3 mg/dL (ref 0.3–1.2)
Total Protein: 6.4 g/dL — ABNORMAL LOW (ref 6.5–8.1)

## 2022-10-18 MED ORDER — TRASTUZUMAB-ANNS CHEMO 150 MG IV SOLR
2.0000 mg/kg | Freq: Once | INTRAVENOUS | Status: AC
  Administered 2022-10-18: 126 mg via INTRAVENOUS
  Filled 2022-10-18: qty 6

## 2022-10-18 MED ORDER — SODIUM CHLORIDE 0.9 % IV SOLN: Freq: Once | INTRAVENOUS | Status: AC

## 2022-10-18 MED ORDER — SODIUM CHLORIDE 0.9% FLUSH
10.0000 mL | Freq: Once | INTRAVENOUS | Status: AC
  Administered 2022-10-18: 10 mL

## 2022-10-18 MED ORDER — ACETAMINOPHEN 325 MG PO TABS
650.0000 mg | ORAL_TABLET | Freq: Once | ORAL | Status: AC
  Administered 2022-10-18: 650 mg via ORAL
  Filled 2022-10-18: qty 2

## 2022-10-18 MED ORDER — SODIUM CHLORIDE 0.9 % IV SOLN
80.0000 mg/m2 | Freq: Once | INTRAVENOUS | Status: AC
  Administered 2022-10-18: 138 mg via INTRAVENOUS
  Filled 2022-10-18: qty 23

## 2022-10-18 MED ORDER — SODIUM CHLORIDE 0.9% FLUSH
10.0000 mL | INTRAVENOUS | Status: DC | PRN
  Administered 2022-10-18: 10 mL

## 2022-10-18 MED ORDER — FAMOTIDINE 20 MG IN NS 100 ML IVPB
20.0000 mg | Freq: Once | INTRAVENOUS | Status: AC
  Administered 2022-10-18: 20 mg via INTRAVENOUS
  Filled 2022-10-18: qty 100

## 2022-10-18 MED ORDER — DIPHENHYDRAMINE HCL 50 MG/ML IJ SOLN
50.0000 mg | Freq: Once | INTRAMUSCULAR | Status: AC
  Administered 2022-10-18: 50 mg via INTRAVENOUS
  Filled 2022-10-18: qty 1

## 2022-10-18 MED ORDER — FAMOTIDINE 200 MG/20ML IV SOLN
20.0000 mg | Freq: Once | INTRAVENOUS | Status: DC
Start: 2022-10-18 — End: 2022-10-18

## 2022-10-18 MED ORDER — HEPARIN SOD (PORK) LOCK FLUSH 100 UNIT/ML IV SOLN
500.0000 [IU] | Freq: Once | INTRAVENOUS | Status: AC | PRN
  Administered 2022-10-18: 500 [IU]

## 2022-10-18 MED ORDER — SODIUM CHLORIDE 0.9 % IV SOLN
10.0000 mg | Freq: Once | INTRAVENOUS | Status: AC
  Administered 2022-10-18: 10 mg via INTRAVENOUS
  Filled 2022-10-18: qty 10

## 2022-10-18 NOTE — Assessment & Plan Note (Addendum)
This is a very pleasant 52 year old premenopausal female patient with newly diagnosed right breast invasive ductal carcinoma T1 N0 M0 ER/PR and HER2 amplified referred to medical oncology for recommendations.   Given triple positive disease, we have discussed about considering adjuvant chemotherapy.  She now here after surgery, final pathology showed tumor measuring 1.3 x 1.2 x 1.0 cm, negative margins, negative lymph nodes.  She is now on weekly herceptin and taxol, doing well except for fatigue and some positional dizziness.  No concerns on PE. She will complete taxol today and will proceed with every 21 day herceptin. Labs CBC reviewed, satisfactory to proceed with evaluation ECHO reviewed, satisfactory Once again, we discussed that there is no blood test to look for recurrence.  RTC in 6 weeks. All their questions were answered to the best of my knowledge.  [name] MD

## 2022-10-18 NOTE — Patient Instructions (Signed)
[facility]  Discharge Instructions: Thank you for choosing [facility] to provide your oncology and hematology care.   If you have a lab appointment with the Cancer Center, please go directly to the Cancer Center and check in at the registration area.   Wear comfortable clothing and clothing appropriate for easy access to any Portacath or PICC line.   We strive to give you quality time with your provider. You may need to reschedule your appointment if you arrive late (15 or more minutes).  Arriving late affects you and other patients whose appointments are after yours.  Also, if you miss three or more appointments without notifying the office, you may be dismissed from the clinic at the provider's discretion.      For prescription refill requests, have your pharmacy contact our office and allow 72 hours for refills to be completed.    Today you received the following chemotherapy and/or immunotherapy agents: Kanjinti, Paclitaxel.       To help prevent nausea and vomiting after your treatment, we encourage you to take your nausea medication as directed.  BELOW ARE SYMPTOMS THAT SHOULD BE REPORTED IMMEDIATELY: *FEVER GREATER THAN 100.4 F (38 C) OR HIGHER *CHILLS OR SWEATING *NAUSEA AND VOMITING THAT IS NOT CONTROLLED WITH YOUR NAUSEA MEDICATION *UNUSUAL SHORTNESS OF BREATH *UNUSUAL BRUISING OR BLEEDING *URINARY PROBLEMS (pain or burning when urinating, or frequent urination) *BOWEL PROBLEMS (unusual diarrhea, constipation, pain near the anus) TENDERNESS IN MOUTH AND THROAT WITH OR WITHOUT PRESENCE OF ULCERS (sore throat, sores in mouth, or a toothache) UNUSUAL RASH, SWELLING OR PAIN  UNUSUAL VAGINAL DISCHARGE OR ITCHING   Items with * indicate a potential emergency and should be followed up as soon as possible or go to the Emergency Department if any problems should occur.  Please show the CHEMOTHERAPY ALERT CARD or IMMUNOTHERAPY ALERT  CARD at check-in to the Emergency Department and triage nurse.  Should you have questions after your visit or need to cancel or reschedule your appointment, please contact [facility]  Dept: [phone]  and follow the prompts.  Office hours are 8:00 a.m. to 4:30 p.m. Monday - Friday. Please note that voicemails left after 4:00 p.m. may not be returned until the following business day.  We are closed weekends and major holidays. You have access to a nurse at all times for urgent questions. Please call the main number to the clinic Dept: [phone] and follow the prompts.   For any non-urgent questions, you may also contact your provider using MyChart. We now offer e-Visits for anyone 18 and older to request care online for non-urgent symptoms. For details visit [email or web address].   Also download the MyChart app! Go to the app store, search "MyChart", open the app, select [facility], and log in with your MyChart username and password.   
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
## 2022-10-18 NOTE — Progress Notes (Unsigned)
HPI: [name] is a 52 y.o. female, who is here today for her routine physical.  Last CPE: 09/29/21  Regular exercise 3 or more time per week: *** Following a healthy diet: ***  Chronic medical problems: ***  Immunization History  Administered Date(s) Administered   Hepb-cpg 04/23/2021   Tdap 06/20/2016   Health Maintenance  Topic Date Due   COLONOSCOPY (Pts 45-39yrs Insurance coverage will need to be confirmed)  Never done   INFLUENZA VACCINE  01/19/2023   PAP SMEAR-Modifier  11/04/2024   DTaP/Tdap/Td (2 - Td or Tdap) 06/20/2026   Hepatitis C Screening  Completed   HIV Screening  Completed   HPV VACCINES  Aged Out   COVID-19 Vaccine  Discontinued    She has *** concerns today.  Review of Systems  Current Outpatient Medications on File Prior to Visit  Medication Sig Dispense Refill   acetaminophen (TYLENOL) 500 MG tablet Take 500 mg by mouth every 8 (eight) hours as needed for mild pain ([place]).     amLODipine (NORVASC) 5 MG tablet TAKE 1 TABLET (5 MG TOTAL) BY MOUTH DAILY. 90 tablet 1   Calcium-Phosphorus-Vitamin D (CITRACAL +D3 PO) Take by mouth.     ferrous sulfate 325 (65 FE) MG tablet Take 325 mg by mouth daily with breakfast.     levothyroxine (SYNTHROID) 50 MCG tablet Take 1 tablet (50 mcg total) by mouth daily. 45 tablet 3   lidocaine-prilocaine (EMLA) cream Apply to affected area once 30 g 3   prochlorperazine (COMPAZINE) 10 MG tablet Take 1 tablet (10 mg total) by mouth every 6 (six) hours as needed for nausea or vomiting. 30 tablet 1   valACYclovir (VALTREX) 500 MG tablet Take 500 mg by mouth as needed.     Current Facility-Administered Medications on File Prior to Visit  Medication Dose Route Frequency Provider Last Rate Last Admin   sodium chloride flush (NS) 0.9 % injection 10 mL  10 mL Intracatheter PRN [name], MD   10 mL at 10/18/22 1312    Past Medical History:  Diagnosis Date   Abnormal uterine bleeding 2022   Anemia  02/12/2021   Patient is taking iron supplementation.   Cancer [facility])    breast   COVID-19 2020   cold-like symptoms   Endometriosis    Fibroids    Graves disease    Hypertension    Hyperthyroidism 2022   s/p Nuclear Medicine RAI therapy on 08/07/20   Seasonal allergies    severe chronic allergies, runny nose, congestion, cough when she lays down due to drainage   Vitamin D deficiency 2021   Wears glasses     Past Surgical History:  Procedure Laterality Date   BREAST BIOPSY Right 06/06/2022   [place] RT BREAST BX W LOC DEV 1ST LESION IMG BX SPEC US GUIDE 06/06/2022 [facility]   BREAST LUMPECTOMY WITH SENTINEL LYMPH NODE BIOPSY Right 07/05/2022   Procedure: RIGHT BREAST LUMPECTOMY WITH SENTINEL LYMPH NODE BX;  Surgeon: Almond Lint, MD;  Location: [facility];  Service: General;  Laterality: Right;   CYSTOSCOPY  10/04/2021   Procedure: CYSTOSCOPY;  Surgeon: Jaymes Graff, MD;  Location: [facility];  Service: Gynecology;;   LAPAROSCOPIC VAGINAL HYSTERECTOMY WITH SALPINGECTOMY N/A 10/04/2021   Procedure: LAPAROSCOPIC ASSISTED VAGINAL HYSTERECTOMY WITH SALPINGECTOMY;  Surgeon: Jaymes Graff, MD;  Location:  SURGERY CENTER;  Service: Gynecology;  Laterality: N/A;   PORTACATH PLACEMENT Left 07/05/2022   Procedure: INSERTION PORT-A-CATH;  Surgeon:  Almond Lint, MD;  Location: [facility];  Service: General;  Laterality: Left;   TUBAL LIGATION  09/2008    Allergies  Allergen Reactions   Latex Itching    Family History  Problem Relation Age of Onset   Breast cancer Sister 96       triple negative; d. early 72s   Cancer Paternal Aunt        unknown type   Thyroid cancer Half-Sister        mat half sister; dx 77s; unknown cell type    Social History   Socioeconomic History   Marital status: Married    Spouse name: Not on file   Number of children: Not on file   Years of education: Not on file   Highest education level:  Not on file  Occupational History   Not on file  Tobacco Use   Smoking status: Never   Smokeless tobacco: Never  Vaping Use   Vaping Use: Never used  Substance and Sexual Activity   Alcohol use: No   Drug use: No   Sexual activity: Yes    Partners: Male    Birth control/protection: Surgical    Comment: BTL   Other Topics Concern   Not on file  Social History Narrative   Not on file   Social Determinants of Health   Financial Resource Strain: Not on file  Food Insecurity: Not on file  Transportation Needs: Not on file  Physical Activity: Not on file  Stress: Not on file  Social Connections: Not on file    There were no vitals filed for this visit. There is no height or weight on file to calculate BMI.  Wt Readings from Last 3 Encounters:  10/18/22 138 lb (62.6 kg)  10/11/22 139 lb 6.4 oz (63.2 kg)  10/04/22 140 lb (63.5 kg)    Physical Exam  ASSESSMENT AND PLAN: [name] was here today annual physical examination.  No orders of the defined types were placed in this encounter.   There are no diagnoses linked to this encounter.  There are no diagnoses linked to this encounter.  No follow-ups on file.  Debanhi Blaker G. [name], MD  [facility]. [place] office.

## 2022-10-19 ENCOUNTER — Ambulatory Visit (INDEPENDENT_AMBULATORY_CARE_PROVIDER_SITE_OTHER): Payer: BC Managed Care – PPO | Admitting: Family Medicine

## 2022-10-19 ENCOUNTER — Encounter: Payer: Self-pay | Admitting: Family Medicine

## 2022-10-19 VITALS — BP 128/80 | HR 79 | Resp 12 | Ht 66.0 in | Wt 139.4 lb

## 2022-10-19 DIAGNOSIS — Z0000 Encounter for general adult medical examination without abnormal findings: Secondary | ICD-10-CM | POA: Insufficient documentation

## 2022-10-19 DIAGNOSIS — E785 Hyperlipidemia, unspecified: Secondary | ICD-10-CM | POA: Diagnosis not present

## 2022-10-19 DIAGNOSIS — I10 Essential (primary) hypertension: Secondary | ICD-10-CM

## 2022-10-19 NOTE — Patient Instructions (Addendum)
A few things to remember from today's visit:  Routine general medical examination at a health care facility  Hyperlipidemia, unspecified hyperlipidemia type - Plan: Lipid panel  Primary hypertension  If you need refills for medications you take chronically, please call your pharmacy. Do not use My Chart to request refills or for acute issues that need immediate attention. If you send a my chart message, it may take a few days to be addressed, specially if I am not in the office.  Please be sure medication list is accurate. If a new problem present, please set up appointment sooner than planned today.  Health Maintenance, Female Adopting a healthy lifestyle and getting preventive care are important in promoting health and wellness. Ask your health care provider about: The right schedule for you to have regular tests and exams. Things you can do on your own to prevent diseases and keep yourself healthy. What should I know about diet, weight, and exercise? Eat a healthy diet  Eat a diet that includes plenty of vegetables, fruits, low-fat dairy products, and lean protein. Do not eat a lot of foods that are high in solid fats, added sugars, or sodium. Maintain a healthy weight Body mass index (BMI) is used to identify weight problems. It estimates body fat based on height and weight. Your health care provider can help determine your BMI and help you achieve or maintain a healthy weight. Get regular exercise Get regular exercise. This is one of the most important things you can do for your health. Most adults should: Exercise for at least 150 minutes each week. The exercise should increase your heart rate and make you sweat (moderate-intensity exercise). Do strengthening exercises at least twice a week. This is in addition to the moderate-intensity exercise. Spend less time sitting. Even light physical activity can be beneficial. Watch cholesterol and blood lipids Have your blood tested for  lipids and cholesterol at 50 years of age, then have this test every 5 years. Have your cholesterol levels checked more often if: Your lipid or cholesterol levels are high. You are older than 50 years of age. You are at high risk for heart disease. What should I know about cancer screening? Depending on your health history and family history, you may need to have cancer screening at various ages. This may include screening for: Breast cancer. Cervical cancer. Colorectal cancer. Skin cancer. Lung cancer. What should I know about heart disease, diabetes, and high blood pressure? Blood pressure and heart disease High blood pressure causes heart disease and increases the risk of stroke. This is more likely to develop in people who have high blood pressure readings or are overweight. Have your blood pressure checked: Every 3-5 years if you are 21-106 years of age. Every year if you are 21 years old or older. Diabetes Have regular diabetes screenings. This checks your fasting blood sugar level. Have the screening done: Once every three years after age 53 if you are at a normal weight and have a low risk for diabetes. More often and at a younger age if you are overweight or have a high risk for diabetes. What should I know about preventing infection? Hepatitis B If you have a higher risk for hepatitis B, you should be screened for this virus. Talk with your health care provider to find out if you are at risk for hepatitis B infection. Hepatitis C Testing is recommended for: Everyone born from [birth year] through 1963. Anyone with known risk factors for hepatitis C. Sexually  transmitted infections (STIs) Get screened for STIs, including gonorrhea and chlamydia, if: You are sexually active and are younger than 50 years of age. You are older than 50 years of age and your health care provider tells you that you are at risk for this type of infection. Your sexual activity has changed since you were last  screened, and you are at increased risk for chlamydia or gonorrhea. Ask your health care provider if you are at risk. Ask your health care provider about whether you are at high risk for HIV. Your health care provider may recommend a prescription medicine to help prevent HIV infection. If you choose to take medicine to prevent HIV, you should first get tested for HIV. You should then be tested every 3 months for as long as you are taking the medicine. Pregnancy If you are about to stop having your period (premenopausal) and you may become pregnant, seek counseling before you get pregnant. Take 400 to 800 micrograms (mcg) of folic acid every day if you become pregnant. Ask for birth control (contraception) if you want to prevent pregnancy. Osteoporosis and menopause Osteoporosis is a disease in which the bones lose minerals and strength with aging. This can result in bone fractures. If you are 12 years old or older, or if you are at risk for osteoporosis and fractures, ask your health care provider if you should: Be screened for bone loss. Take a calcium or vitamin D supplement to lower your risk of fractures. Be given hormone replacement therapy (HRT) to treat symptoms of menopause. Follow these instructions at home: Alcohol use Do not drink alcohol if: Your health care provider tells you not to drink. You are pregnant, may be pregnant, or are planning to become pregnant. If you drink alcohol: Limit how much you have to: 0-1 drink a day. Know how much alcohol is in your drink. In the [place] one drink equals one 12 oz bottle of beer (355 mL), one 5 oz glass of wine (148 mL), or one 1 oz glass of hard liquor (44 mL). Lifestyle Do not use any products that contain nicotine or tobacco. These products include cigarettes, chewing tobacco, and vaping devices, such as e-cigarettes. If you need help quitting, ask your health care provider. Do not use street drugs. Do not share needles. Ask your health  care provider for help if you need support or information about quitting drugs. General instructions Schedule regular health, dental, and eye exams. Stay current with your vaccines. Tell your health care provider if: You often feel depressed. You have ever been abused or do not feel safe at home. Summary Adopting a healthy lifestyle and getting preventive care are important in promoting health and wellness. Follow your health care provider's instructions about healthy diet, exercising, and getting tested or screened for diseases. Follow your health care provider's instructions on monitoring your cholesterol and blood pressure. This information is not intended to replace advice given to you by your health care provider. Make sure you discuss any questions you have with your health care provider. Document Revised: 10/26/2020 Document Reviewed: 10/26/2020 Elsevier Patient Education  2023 [employer].

## 2022-10-20 LAB — LIPID PANEL
Chol/HDL Ratio: 5.3 ratio — ABNORMAL HIGH (ref 0.0–4.4)
Cholesterol, Total: 212 mg/dL — ABNORMAL HIGH (ref 100–199)
HDL: 40 mg/dL (ref >39)
LDL Chol Calc (NIH): 158 mg/dL — ABNORMAL HIGH (ref 0–99)
Triglycerides: 79 mg/dL (ref 0–149)
VLDL Cholesterol Cal: 14 mg/dL (ref 5–40)

## 2022-10-20 NOTE — Assessment & Plan Note (Signed)
Non pharmacologic treatment recommended for now. Further recommendations will be given according to 10 years CVD risk score and lipid panel numbers. 

## 2022-10-20 NOTE — Assessment & Plan Note (Addendum)
We discussed the importance of regular physical activity and healthy diet for prevention of chronic illness and/or complications. Preventive guidelines reviewed. Vaccination up to date. She is not interested in having colonoscopy at this time, she will contact Dr Loreta Ave when she decides to do so. Next CPE in a year.

## 2022-10-20 NOTE — Assessment & Plan Note (Signed)
BP adequately controlled. Continue Amlodipine 5 mg daily and low salt diet. Eye exam is due, planning on arranging appt.

## 2022-10-24 ENCOUNTER — Other Ambulatory Visit: Payer: Self-pay | Admitting: *Deleted

## 2022-10-24 DIAGNOSIS — Z170 Estrogen receptor positive status [ER+]: Secondary | ICD-10-CM

## 2022-10-25 ENCOUNTER — Other Ambulatory Visit: Payer: Self-pay | Admitting: Hematology and Oncology

## 2022-10-25 ENCOUNTER — Inpatient Hospital Stay: Payer: BC Managed Care – PPO | Attending: Hematology and Oncology

## 2022-10-25 ENCOUNTER — Other Ambulatory Visit: Payer: Self-pay

## 2022-10-25 ENCOUNTER — Inpatient Hospital Stay: Payer: BC Managed Care – PPO

## 2022-10-25 ENCOUNTER — Inpatient Hospital Stay (HOSPITAL_BASED_OUTPATIENT_CLINIC_OR_DEPARTMENT_OTHER): Payer: BC Managed Care – PPO | Admitting: Hematology and Oncology

## 2022-10-25 VITALS — BP 131/80 | HR 63

## 2022-10-25 DIAGNOSIS — Z170 Estrogen receptor positive status [ER+]: Secondary | ICD-10-CM

## 2022-10-25 DIAGNOSIS — Z95828 Presence of other vascular implants and grafts: Secondary | ICD-10-CM

## 2022-10-25 DIAGNOSIS — C50411 Malignant neoplasm of upper-outer quadrant of right female breast: Secondary | ICD-10-CM | POA: Insufficient documentation

## 2022-10-25 DIAGNOSIS — Z5112 Encounter for antineoplastic immunotherapy: Secondary | ICD-10-CM | POA: Insufficient documentation

## 2022-10-25 LAB — CBC WITH DIFFERENTIAL (CANCER CENTER ONLY)
Abs Immature Granulocytes: 0.02 10*3/uL (ref 0.00–0.07)
Basophils Absolute: 0 10*3/uL (ref 0.0–0.1)
Basophils Relative: 0 %
Eosinophils Absolute: 0.1 10*3/uL (ref 0.0–0.5)
Eosinophils Relative: 2 %
HCT: 29.2 % — ABNORMAL LOW (ref 36.0–46.0)
Hemoglobin: 9.1 g/dL — ABNORMAL LOW (ref 12.0–15.0)
Immature Granulocytes: 0 %
Lymphocytes Relative: 49 %
Lymphs Abs: 2.3 10*3/uL (ref 0.7–4.0)
MCH: 26.7 pg (ref 26.0–34.0)
MCHC: 31.2 g/dL (ref 30.0–36.0)
MCV: 85.6 fL (ref 80.0–100.0)
Monocytes Absolute: 0.2 10*3/uL (ref 0.1–1.0)
Monocytes Relative: 4 %
Neutro Abs: 2.1 10*3/uL (ref 1.7–7.7)
Neutrophils Relative %: 45 %
Platelet Count: 211 10*3/uL (ref 150–400)
RBC: 3.41 MIL/uL — ABNORMAL LOW (ref 3.87–5.11)
RDW: 20.8 % — ABNORMAL HIGH (ref 11.5–15.5)
WBC Count: 4.7 10*3/uL (ref 4.0–10.5)
nRBC: 0 % (ref 0.0–0.2)

## 2022-10-25 LAB — CMP (CANCER CENTER ONLY)
ALT: 19 U/L (ref 0–44)
AST: 15 U/L (ref 15–41)
Albumin: 3.5 g/dL (ref 3.5–5.0)
Alkaline Phosphatase: 49 U/L (ref 38–126)
Anion gap: 6 (ref 5–15)
BUN: 24 mg/dL — ABNORMAL HIGH (ref 6–20)
CO2: 27 mmol/L (ref 22–32)
Calcium: 9 mg/dL (ref 8.9–10.3)
Chloride: 105 mmol/L (ref 98–111)
Creatinine: 0.82 mg/dL (ref 0.44–1.00)
GFR, Estimated: >60 mL/min (ref >60)
Glucose, Bld: 102 mg/dL — ABNORMAL HIGH (ref 70–99)
Potassium: 3.6 mmol/L (ref 3.5–5.1)
Sodium: 138 mmol/L (ref 135–145)
Total Bilirubin: 0.6 mg/dL (ref 0.3–1.2)
Total Protein: 6.3 g/dL — ABNORMAL LOW (ref 6.5–8.1)

## 2022-10-25 MED ORDER — SODIUM CHLORIDE 0.9% FLUSH
10.0000 mL | INTRAVENOUS | Status: DC | PRN
  Administered 2022-10-25: 10 mL

## 2022-10-25 MED ORDER — SODIUM CHLORIDE 0.9% FLUSH
10.0000 mL | Freq: Once | INTRAVENOUS | Status: AC
  Administered 2022-10-25: 10 mL

## 2022-10-25 MED ORDER — HEPARIN SOD (PORK) LOCK FLUSH 100 UNIT/ML IV SOLN
500.0000 [IU] | Freq: Once | INTRAVENOUS | Status: AC | PRN
  Administered 2022-10-25: 500 [IU]

## 2022-10-25 MED ORDER — TRASTUZUMAB-ANNS CHEMO 150 MG IV SOLR
6.0000 mg/kg | Freq: Once | INTRAVENOUS | Status: AC
  Administered 2022-10-25: 420 mg via INTRAVENOUS
  Filled 2022-10-25: qty 20

## 2022-10-25 MED ORDER — DIPHENHYDRAMINE HCL 25 MG PO CAPS
50.0000 mg | ORAL_CAPSULE | Freq: Once | ORAL | Status: AC
  Administered 2022-10-25: 50 mg via ORAL
  Filled 2022-10-25: qty 2

## 2022-10-25 MED ORDER — SODIUM CHLORIDE 0.9 % IV SOLN: Freq: Once | INTRAVENOUS | Status: AC

## 2022-10-25 MED ORDER — ACETAMINOPHEN 325 MG PO TABS
650.0000 mg | ORAL_TABLET | Freq: Once | ORAL | Status: AC
  Administered 2022-10-25: 650 mg via ORAL
  Filled 2022-10-25: qty 2
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
## 2022-10-25 NOTE — Progress Notes (Signed)
Encounter cancelled  °

## 2022-10-25 NOTE — Patient Instructions (Signed)
[facility]  Discharge Instructions: Thank you for choosing [facility] to provide your oncology and hematology care.   If you have a lab appointment with the Cancer Center, please go directly to the Cancer Center and check in at the registration area.   Wear comfortable clothing and clothing appropriate for easy access to any Portacath or PICC line.   We strive to give you quality time with your provider. You may need to reschedule your appointment if you arrive late (15 or more minutes).  Arriving late affects you and other patients whose appointments are after yours.  Also, if you miss three or more appointments without notifying the office, you may be dismissed from the clinic at the provider's discretion.      For prescription refill requests, have your pharmacy contact our office and allow 72 hours for refills to be completed.    Today you received the following chemotherapy and/or immunotherapy agents: Trastuzumab      To help prevent nausea and vomiting after your treatment, we encourage you to take your nausea medication as directed.  BELOW ARE SYMPTOMS THAT SHOULD BE REPORTED IMMEDIATELY: *FEVER GREATER THAN 100.4 F (38 C) OR HIGHER *CHILLS OR SWEATING *NAUSEA AND VOMITING THAT IS NOT CONTROLLED WITH YOUR NAUSEA MEDICATION *UNUSUAL SHORTNESS OF BREATH *UNUSUAL BRUISING OR BLEEDING *URINARY PROBLEMS (pain or burning when urinating, or frequent urination) *BOWEL PROBLEMS (unusual diarrhea, constipation, pain near the anus) TENDERNESS IN MOUTH AND THROAT WITH OR WITHOUT PRESENCE OF ULCERS (sore throat, sores in mouth, or a toothache) UNUSUAL RASH, SWELLING OR PAIN  UNUSUAL VAGINAL DISCHARGE OR ITCHING   Items with * indicate a potential emergency and should be followed up as soon as possible or go to the Emergency Department if any problems should occur.  Please show the CHEMOTHERAPY ALERT CARD or IMMUNOTHERAPY ALERT CARD at  check-in to the Emergency Department and triage nurse.  Should you have questions after your visit or need to cancel or reschedule your appointment, please contact [facility]  Dept: [phone]  and follow the prompts.  Office hours are 8:00 a.m. to 4:30 p.m. Monday - Friday. Please note that voicemails left after 4:00 p.m. may not be returned until the following business day.  We are closed weekends and major holidays. You have access to a nurse at all times for urgent questions. Please call the main number to the clinic Dept: [phone] and follow the prompts.   For any non-urgent questions, you may also contact your provider using MyChart. We now offer e-Visits for anyone 18 and older to request care online for non-urgent symptoms. For details visit [email or web address].   Also download the MyChart app! Go to the app store, search "MyChart", open the app, select [facility], and log in with your MyChart username and password.  

## 2022-10-31 ENCOUNTER — Encounter: Payer: Self-pay | Admitting: *Deleted

## 2022-11-07 ENCOUNTER — Telehealth: Payer: Self-pay

## 2022-11-07 NOTE — Telephone Encounter (Signed)
Rn called to attempt to gather nurse evaluation information without success. Rn left message for pt on voicemail. Rn will attempt to call back later.
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
## 2022-11-08 ENCOUNTER — Ambulatory Visit
Admission: RE | Admit: 2022-11-08 | Discharge: 2022-11-08 | Disposition: A | Discharge status: Home / Self Care | Payer: BC Managed Care – PPO | Source: Ambulatory Visit | Attending: Radiation Oncology | Admitting: Radiation Oncology

## 2022-11-08 ENCOUNTER — Encounter: Payer: Self-pay | Admitting: Radiation Oncology

## 2022-11-08 VITALS — BP 143/91 | HR 65 | Temp 96.9°F | Resp 18 | Ht 66.0 in | Wt 140.1 lb

## 2022-11-08 DIAGNOSIS — Z510 Encounter for antineoplastic radiation therapy: Secondary | ICD-10-CM | POA: Insufficient documentation

## 2022-11-08 DIAGNOSIS — C50411 Malignant neoplasm of upper-outer quadrant of right female breast: Secondary | ICD-10-CM | POA: Insufficient documentation

## 2022-11-08 DIAGNOSIS — Z79890 Hormone replacement therapy (postmenopausal): Secondary | ICD-10-CM | POA: Diagnosis not present

## 2022-11-08 DIAGNOSIS — Z79899 Other long term (current) drug therapy: Secondary | ICD-10-CM | POA: Insufficient documentation

## 2022-11-08 DIAGNOSIS — Z170 Estrogen receptor positive status [ER+]: Secondary | ICD-10-CM

## 2022-11-11 DIAGNOSIS — Z510 Encounter for antineoplastic radiation therapy: Secondary | ICD-10-CM | POA: Diagnosis not present

## 2022-11-15 ENCOUNTER — Inpatient Hospital Stay (HOSPITAL_BASED_OUTPATIENT_CLINIC_OR_DEPARTMENT_OTHER): Payer: BC Managed Care – PPO | Admitting: Hematology and Oncology

## 2022-11-15 ENCOUNTER — Encounter: Payer: Self-pay | Admitting: *Deleted

## 2022-11-15 ENCOUNTER — Encounter: Payer: Self-pay | Admitting: Hematology and Oncology

## 2022-11-15 ENCOUNTER — Inpatient Hospital Stay: Payer: BC Managed Care – PPO

## 2022-11-15 VITALS — BP 155/83 | HR 64 | Temp 97.7°F | Resp 16 | Ht 64.17 in | Wt 140.8 lb

## 2022-11-15 VITALS — BP 128/77 | HR 59 | Temp 98.5°F | Resp 16

## 2022-11-15 DIAGNOSIS — C50411 Malignant neoplasm of upper-outer quadrant of right female breast: Secondary | ICD-10-CM | POA: Diagnosis not present

## 2022-11-15 DIAGNOSIS — Z170 Estrogen receptor positive status [ER+]: Secondary | ICD-10-CM

## 2022-11-15 DIAGNOSIS — Z95828 Presence of other vascular implants and grafts: Secondary | ICD-10-CM

## 2022-11-15 DIAGNOSIS — Z5112 Encounter for antineoplastic immunotherapy: Secondary | ICD-10-CM | POA: Diagnosis not present

## 2022-11-15 MED ORDER — TRASTUZUMAB-ANNS CHEMO 150 MG IV SOLR
6.0000 mg/kg | Freq: Once | INTRAVENOUS | Status: AC
  Administered 2022-11-15: 420 mg via INTRAVENOUS
  Filled 2022-11-15: qty 20

## 2022-11-15 MED ORDER — ACETAMINOPHEN 325 MG PO TABS
650.0000 mg | ORAL_TABLET | Freq: Once | ORAL | Status: AC
  Administered 2022-11-15: 650 mg via ORAL
  Filled 2022-11-15: qty 2

## 2022-11-15 MED ORDER — DIPHENHYDRAMINE HCL 25 MG PO CAPS
25.0000 mg | ORAL_CAPSULE | Freq: Once | ORAL | Status: AC
  Administered 2022-11-15: 25 mg via ORAL
  Filled 2022-11-15: qty 1

## 2022-11-15 MED ORDER — SODIUM CHLORIDE 0.9% FLUSH
10.0000 mL | INTRAVENOUS | Status: DC | PRN
  Administered 2022-11-15: 10 mL

## 2022-11-15 MED ORDER — SODIUM CHLORIDE 0.9 % IV SOLN: Freq: Once | INTRAVENOUS | Status: AC

## 2022-11-15 MED ORDER — HEPARIN SOD (PORK) LOCK FLUSH 100 UNIT/ML IV SOLN
500.0000 [IU] | Freq: Once | INTRAVENOUS | Status: AC | PRN
  Administered 2022-11-15: 500 [IU]

## 2022-11-15 NOTE — Patient Instructions (Signed)
[facility]  Discharge Instructions: Thank you for choosing [facility] to provide your oncology and hematology care.   If you have a lab appointment with the Cancer Center, please go directly to the Cancer Center and check in at the registration area.   Wear comfortable clothing and clothing appropriate for easy access to any Portacath or PICC line.   We strive to give you quality time with your provider. You may need to reschedule your appointment if you arrive late (15 or more minutes).  Arriving late affects you and other patients whose appointments are after yours.  Also, if you miss three or more appointments without notifying the office, you may be dismissed from the clinic at the provider's discretion.      For prescription refill requests, have your pharmacy contact our office and allow 72 hours for refills to be completed.    Today you received the following chemotherapy and/or immunotherapy agents: Trastuzumab      To help prevent nausea and vomiting after your treatment, we encourage you to take your nausea medication as directed.  BELOW ARE SYMPTOMS THAT SHOULD BE REPORTED IMMEDIATELY: *FEVER GREATER THAN 100.4 F (38 C) OR HIGHER *CHILLS OR SWEATING *NAUSEA AND VOMITING THAT IS NOT CONTROLLED WITH YOUR NAUSEA MEDICATION *UNUSUAL SHORTNESS OF BREATH *UNUSUAL BRUISING OR BLEEDING *URINARY PROBLEMS (pain or burning when urinating, or frequent urination) *BOWEL PROBLEMS (unusual diarrhea, constipation, pain near the anus) TENDERNESS IN MOUTH AND THROAT WITH OR WITHOUT PRESENCE OF ULCERS (sore throat, sores in mouth, or a toothache) UNUSUAL RASH, SWELLING OR PAIN  UNUSUAL VAGINAL DISCHARGE OR ITCHING   Items with * indicate a potential emergency and should be followed up as soon as possible or go to the Emergency Department if any problems should occur.  Please show the CHEMOTHERAPY ALERT CARD or IMMUNOTHERAPY ALERT CARD at  check-in to the Emergency Department and triage nurse.  Should you have questions after your visit or need to cancel or reschedule your appointment, please contact [facility]  Dept: [phone]  and follow the prompts.  Office hours are 8:00 a.m. to 4:30 p.m. Monday - Friday. Please note that voicemails left after 4:00 p.m. may not be returned until the following business day.  We are closed weekends and major holidays. You have access to a nurse at all times for urgent questions. Please call the main number to the clinic Dept: [phone] and follow the prompts.   For any non-urgent questions, you may also contact your provider using MyChart. We now offer e-Visits for anyone 18 and older to request care online for non-urgent symptoms. For details visit [email or web address].   Also download the MyChart app! Go to the app store, search "MyChart", open the app, select [facility], and log in with your MyChart username and password.  

## 2022-11-15 NOTE — Assessment & Plan Note (Addendum)
This is a very pleasant 52 year old premenopausal female patient with newly diagnosed right breast invasive ductal carcinoma T1 N0 M0 ER/PR and HER2 amplified referred to medical oncology for recommendations.   Given triple positive disease, we have discussed about considering adjuvant chemotherapy.  She now here after surgery, final pathology showed tumor measuring 1.3 x 1.2 x 1.0 cm, negative margins, negative lymph nodes.  She has now completed weekly Herceptin and Taxol and will continue on every 21-day Herceptin. ECHO reviewed, satisfactory EF stable 60-65%, next one due in July. We have once again discussed about the role of maintenance herceptin. Ok to continue radiation concomitantly. I will see her back in 6 weeks.  All their questions were answered to the best of my knowledge.  [name] MD
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
## 2022-11-16 ENCOUNTER — Other Ambulatory Visit: Payer: Self-pay

## 2022-11-16 ENCOUNTER — Ambulatory Visit
Admission: RE | Admit: 2022-11-16 | Discharge: 2022-11-16 | Disposition: A | Discharge status: Home / Self Care | Payer: BC Managed Care – PPO | Source: Ambulatory Visit | Attending: Radiation Oncology | Admitting: Radiation Oncology

## 2022-11-16 DIAGNOSIS — Z510 Encounter for antineoplastic radiation therapy: Secondary | ICD-10-CM | POA: Diagnosis not present

## 2022-11-16 LAB — RAD ONC ARIA SESSION SUMMARY
Course Elapsed Days: 0
Plan Fractions Treated to Date: 1
Plan Prescribed Dose Per Fraction: 2.67 Gy
Plan Total Fractions Prescribed: 15
Plan Total Prescribed Dose: 40.05 Gy
Reference Point Dosage Given to Date: 2.67 Gy
Reference Point Session Dosage Given: 2.67 Gy
Session Number: 1

## 2022-11-17 ENCOUNTER — Other Ambulatory Visit: Payer: Self-pay

## 2022-11-17 ENCOUNTER — Ambulatory Visit
Admission: RE | Admit: 2022-11-17 | Discharge: 2022-11-17 | Disposition: A | Discharge status: Home / Self Care | Payer: BC Managed Care – PPO | Source: Ambulatory Visit | Attending: Radiation Oncology | Admitting: Radiation Oncology

## 2022-11-17 DIAGNOSIS — Z170 Estrogen receptor positive status [ER+]: Secondary | ICD-10-CM

## 2022-11-17 DIAGNOSIS — Z510 Encounter for antineoplastic radiation therapy: Secondary | ICD-10-CM | POA: Diagnosis not present

## 2022-11-17 LAB — RAD ONC ARIA SESSION SUMMARY
Course Elapsed Days: 1
Plan Fractions Treated to Date: 2
Plan Prescribed Dose Per Fraction: 2.67 Gy
Plan Total Fractions Prescribed: 15
Plan Total Prescribed Dose: 40.05 Gy
Reference Point Dosage Given to Date: 5.34 Gy
Reference Point Session Dosage Given: 2.67 Gy
Session Number: 2

## 2022-11-17 MED ORDER — RADIAPLEXRX EX GEL: Freq: Once | CUTANEOUS | Status: AC

## 2022-11-18 ENCOUNTER — Other Ambulatory Visit: Payer: Self-pay

## 2022-11-18 ENCOUNTER — Ambulatory Visit
Admission: RE | Admit: 2022-11-18 | Discharge: 2022-11-18 | Disposition: A | Discharge status: Home / Self Care | Payer: BC Managed Care – PPO | Source: Ambulatory Visit | Attending: Radiation Oncology | Admitting: Radiation Oncology

## 2022-11-18 DIAGNOSIS — Z510 Encounter for antineoplastic radiation therapy: Secondary | ICD-10-CM | POA: Diagnosis not present

## 2022-11-18 LAB — RAD ONC ARIA SESSION SUMMARY
Course Elapsed Days: 2
Plan Fractions Treated to Date: 3
Plan Prescribed Dose Per Fraction: 2.67 Gy
Plan Total Fractions Prescribed: 15
Plan Total Prescribed Dose: 40.05 Gy
Reference Point Dosage Given to Date: 8.01 Gy
Reference Point Session Dosage Given: 2.67 Gy
Session Number: 3

## 2022-11-21 ENCOUNTER — Other Ambulatory Visit: Payer: Self-pay

## 2022-11-21 ENCOUNTER — Ambulatory Visit: Payer: BC Managed Care – PPO

## 2022-11-21 ENCOUNTER — Ambulatory Visit
Admission: RE | Admit: 2022-11-21 | Discharge: 2022-11-21 | Disposition: A | Discharge status: Home / Self Care | Payer: BC Managed Care – PPO | Source: Ambulatory Visit | Attending: Radiation Oncology | Admitting: Radiation Oncology

## 2022-11-21 DIAGNOSIS — Z170 Estrogen receptor positive status [ER+]: Secondary | ICD-10-CM | POA: Diagnosis not present

## 2022-11-21 DIAGNOSIS — Z79899 Other long term (current) drug therapy: Secondary | ICD-10-CM | POA: Diagnosis not present

## 2022-11-21 DIAGNOSIS — C50411 Malignant neoplasm of upper-outer quadrant of right female breast: Secondary | ICD-10-CM | POA: Diagnosis present

## 2022-11-21 DIAGNOSIS — Z452 Encounter for adjustment and management of vascular access device: Secondary | ICD-10-CM | POA: Insufficient documentation

## 2022-11-21 DIAGNOSIS — Z510 Encounter for antineoplastic radiation therapy: Secondary | ICD-10-CM | POA: Diagnosis present

## 2022-11-21 DIAGNOSIS — Z5112 Encounter for antineoplastic immunotherapy: Secondary | ICD-10-CM | POA: Insufficient documentation

## 2022-11-21 LAB — RAD ONC ARIA SESSION SUMMARY
Course Elapsed Days: 5
Plan Fractions Treated to Date: 4
Plan Prescribed Dose Per Fraction: 2.67 Gy
Plan Total Fractions Prescribed: 15
Plan Total Prescribed Dose: 40.05 Gy
Reference Point Dosage Given to Date: 10.68 Gy
Reference Point Session Dosage Given: 2.67 Gy
Session Number: 4

## 2022-11-22 ENCOUNTER — Other Ambulatory Visit: Payer: Self-pay

## 2022-11-22 ENCOUNTER — Ambulatory Visit
Admission: RE | Admit: 2022-11-22 | Discharge: 2022-11-22 | Disposition: A | Discharge status: Home / Self Care | Payer: BC Managed Care – PPO | Source: Ambulatory Visit | Attending: Radiation Oncology | Admitting: Radiation Oncology

## 2022-11-22 DIAGNOSIS — Z510 Encounter for antineoplastic radiation therapy: Secondary | ICD-10-CM | POA: Diagnosis not present

## 2022-11-22 LAB — RAD ONC ARIA SESSION SUMMARY
Course Elapsed Days: 6
Plan Fractions Treated to Date: 5
Plan Prescribed Dose Per Fraction: 2.67 Gy
Plan Total Fractions Prescribed: 15
Plan Total Prescribed Dose: 40.05 Gy
Reference Point Dosage Given to Date: 13.35 Gy
Reference Point Session Dosage Given: 2.67 Gy
Session Number: 5

## 2022-11-23 ENCOUNTER — Other Ambulatory Visit: Payer: Self-pay

## 2022-11-23 ENCOUNTER — Ambulatory Visit: Payer: BC Managed Care – PPO

## 2022-11-23 ENCOUNTER — Ambulatory Visit
Admission: RE | Admit: 2022-11-23 | Discharge: 2022-11-23 | Disposition: A | Discharge status: Home / Self Care | Payer: BC Managed Care – PPO | Source: Ambulatory Visit | Attending: Radiation Oncology | Admitting: Radiation Oncology

## 2022-11-23 DIAGNOSIS — Z510 Encounter for antineoplastic radiation therapy: Secondary | ICD-10-CM | POA: Diagnosis not present

## 2022-11-23 LAB — RAD ONC ARIA SESSION SUMMARY
Course Elapsed Days: 7
Plan Fractions Treated to Date: 6
Plan Prescribed Dose Per Fraction: 2.67 Gy
Plan Total Fractions Prescribed: 15
Plan Total Prescribed Dose: 40.05 Gy
Reference Point Dosage Given to Date: 16.02 Gy
Reference Point Session Dosage Given: 2.67 Gy
Session Number: 6

## 2022-11-24 ENCOUNTER — Ambulatory Visit
Admission: RE | Admit: 2022-11-24 | Discharge: 2022-11-24 | Disposition: A | Discharge status: Home / Self Care | Payer: BC Managed Care – PPO | Source: Ambulatory Visit | Attending: Radiation Oncology | Admitting: Radiation Oncology

## 2022-11-24 ENCOUNTER — Other Ambulatory Visit: Payer: Self-pay

## 2022-11-24 DIAGNOSIS — Z510 Encounter for antineoplastic radiation therapy: Secondary | ICD-10-CM | POA: Diagnosis not present

## 2022-11-24 LAB — RAD ONC ARIA SESSION SUMMARY
Course Elapsed Days: 8
Plan Fractions Treated to Date: 7
Plan Prescribed Dose Per Fraction: 2.67 Gy
Plan Total Fractions Prescribed: 15
Plan Total Prescribed Dose: 40.05 Gy
Reference Point Dosage Given to Date: 18.69 Gy
Reference Point Session Dosage Given: 2.67 Gy
Session Number: 7

## 2022-11-25 ENCOUNTER — Ambulatory Visit
Admission: RE | Admit: 2022-11-25 | Discharge: 2022-11-25 | Disposition: A | Discharge status: Home / Self Care | Payer: BC Managed Care – PPO | Source: Ambulatory Visit | Attending: Radiation Oncology | Admitting: Radiation Oncology

## 2022-11-25 ENCOUNTER — Other Ambulatory Visit: Payer: Self-pay

## 2022-11-25 DIAGNOSIS — Z510 Encounter for antineoplastic radiation therapy: Secondary | ICD-10-CM | POA: Diagnosis not present

## 2022-11-25 LAB — RAD ONC ARIA SESSION SUMMARY
Course Elapsed Days: 9
Plan Fractions Treated to Date: 8
Plan Prescribed Dose Per Fraction: 2.67 Gy
Plan Total Fractions Prescribed: 15
Plan Total Prescribed Dose: 40.05 Gy
Reference Point Dosage Given to Date: 21.36 Gy
Reference Point Session Dosage Given: 2.67 Gy
Session Number: 8

## 2022-11-28 ENCOUNTER — Other Ambulatory Visit: Payer: Self-pay

## 2022-11-28 ENCOUNTER — Ambulatory Visit
Admission: RE | Admit: 2022-11-28 | Discharge: 2022-11-28 | Disposition: A | Discharge status: Home / Self Care | Payer: BC Managed Care – PPO | Source: Ambulatory Visit | Attending: Radiation Oncology | Admitting: Radiation Oncology

## 2022-11-28 DIAGNOSIS — Z510 Encounter for antineoplastic radiation therapy: Secondary | ICD-10-CM | POA: Diagnosis not present

## 2022-11-28 LAB — RAD ONC ARIA SESSION SUMMARY
Course Elapsed Days: 12
Plan Fractions Treated to Date: 9
Plan Prescribed Dose Per Fraction: 2.67 Gy
Plan Total Fractions Prescribed: 15
Plan Total Prescribed Dose: 40.05 Gy
Reference Point Dosage Given to Date: 24.03 Gy
Reference Point Session Dosage Given: 2.67 Gy
Session Number: 9

## 2022-11-29 ENCOUNTER — Other Ambulatory Visit: Payer: Self-pay

## 2022-11-29 ENCOUNTER — Encounter: Payer: Self-pay | Admitting: *Deleted

## 2022-11-29 ENCOUNTER — Ambulatory Visit
Admission: RE | Admit: 2022-11-29 | Discharge: 2022-11-29 | Disposition: A | Discharge status: Home / Self Care | Payer: BC Managed Care – PPO | Source: Ambulatory Visit | Attending: Radiation Oncology | Admitting: Radiation Oncology

## 2022-11-29 DIAGNOSIS — Z510 Encounter for antineoplastic radiation therapy: Secondary | ICD-10-CM | POA: Diagnosis not present

## 2022-11-29 LAB — RAD ONC ARIA SESSION SUMMARY
Course Elapsed Days: 13
Plan Fractions Treated to Date: 10
Plan Prescribed Dose Per Fraction: 2.67 Gy
Plan Total Fractions Prescribed: 15
Plan Total Prescribed Dose: 40.05 Gy
Reference Point Dosage Given to Date: 26.7 Gy
Reference Point Session Dosage Given: 2.67 Gy
Session Number: 10

## 2022-11-30 ENCOUNTER — Other Ambulatory Visit: Payer: Self-pay

## 2022-11-30 ENCOUNTER — Ambulatory Visit
Admission: RE | Admit: 2022-11-30 | Discharge: 2022-11-30 | Disposition: A | Discharge status: Home / Self Care | Payer: BC Managed Care – PPO | Source: Ambulatory Visit | Attending: Radiation Oncology | Admitting: Radiation Oncology

## 2022-11-30 DIAGNOSIS — Z510 Encounter for antineoplastic radiation therapy: Secondary | ICD-10-CM | POA: Diagnosis not present

## 2022-11-30 LAB — RAD ONC ARIA SESSION SUMMARY
Course Elapsed Days: 14
Plan Fractions Treated to Date: 11
Plan Prescribed Dose Per Fraction: 2.67 Gy
Plan Total Fractions Prescribed: 15
Plan Total Prescribed Dose: 40.05 Gy
Reference Point Dosage Given to Date: 29.37 Gy
Reference Point Session Dosage Given: 2.67 Gy
Session Number: 11

## 2022-12-01 ENCOUNTER — Other Ambulatory Visit: Payer: Self-pay

## 2022-12-01 ENCOUNTER — Ambulatory Visit
Admission: RE | Admit: 2022-12-01 | Discharge: 2022-12-01 | Disposition: A | Discharge status: Home / Self Care | Payer: BC Managed Care – PPO | Source: Ambulatory Visit | Attending: Radiation Oncology | Admitting: Radiation Oncology

## 2022-12-01 DIAGNOSIS — Z510 Encounter for antineoplastic radiation therapy: Secondary | ICD-10-CM | POA: Diagnosis not present

## 2022-12-01 LAB — RAD ONC ARIA SESSION SUMMARY
Course Elapsed Days: 15
Plan Fractions Treated to Date: 12
Plan Prescribed Dose Per Fraction: 2.67 Gy
Plan Total Fractions Prescribed: 15
Plan Total Prescribed Dose: 40.05 Gy
Reference Point Dosage Given to Date: 32.04 Gy
Reference Point Session Dosage Given: 2.67 Gy
Session Number: 12

## 2022-12-02 ENCOUNTER — Other Ambulatory Visit: Payer: Self-pay

## 2022-12-02 ENCOUNTER — Ambulatory Visit
Admission: RE | Admit: 2022-12-02 | Discharge: 2022-12-02 | Disposition: A | Discharge status: Home / Self Care | Payer: BC Managed Care – PPO | Source: Ambulatory Visit | Attending: Radiation Oncology | Admitting: Radiation Oncology

## 2022-12-02 DIAGNOSIS — Z510 Encounter for antineoplastic radiation therapy: Secondary | ICD-10-CM | POA: Diagnosis not present

## 2022-12-02 LAB — RAD ONC ARIA SESSION SUMMARY
Course Elapsed Days: 16
Plan Fractions Treated to Date: 13
Plan Prescribed Dose Per Fraction: 2.67 Gy
Plan Total Fractions Prescribed: 15
Plan Total Prescribed Dose: 40.05 Gy
Reference Point Dosage Given to Date: 34.71 Gy
Reference Point Session Dosage Given: 2.67 Gy
Session Number: 13

## 2022-12-05 ENCOUNTER — Ambulatory Visit
Admission: RE | Admit: 2022-12-05 | Discharge: 2022-12-05 | Disposition: A | Discharge status: Home / Self Care | Payer: BC Managed Care – PPO | Source: Ambulatory Visit | Attending: Radiation Oncology | Admitting: Radiation Oncology

## 2022-12-05 ENCOUNTER — Other Ambulatory Visit: Payer: Self-pay

## 2022-12-05 ENCOUNTER — Ambulatory Visit: Payer: BC Managed Care – PPO | Admitting: Radiation Oncology

## 2022-12-05 DIAGNOSIS — Z510 Encounter for antineoplastic radiation therapy: Secondary | ICD-10-CM | POA: Diagnosis not present

## 2022-12-05 LAB — RAD ONC ARIA SESSION SUMMARY
Course Elapsed Days: 19
Plan Fractions Treated to Date: 14
Plan Prescribed Dose Per Fraction: 2.67 Gy
Plan Total Fractions Prescribed: 15
Plan Total Prescribed Dose: 40.05 Gy
Reference Point Dosage Given to Date: 37.38 Gy
Reference Point Session Dosage Given: 2.67 Gy
Session Number: 14

## 2022-12-06 ENCOUNTER — Other Ambulatory Visit: Payer: Self-pay

## 2022-12-06 ENCOUNTER — Inpatient Hospital Stay (HOSPITAL_BASED_OUTPATIENT_CLINIC_OR_DEPARTMENT_OTHER): Payer: BC Managed Care – PPO | Admitting: Hematology and Oncology

## 2022-12-06 ENCOUNTER — Ambulatory Visit
Admission: RE | Admit: 2022-12-06 | Discharge: 2022-12-06 | Disposition: A | Discharge status: Home / Self Care | Payer: BC Managed Care – PPO | Source: Ambulatory Visit | Attending: Radiation Oncology | Admitting: Radiation Oncology

## 2022-12-06 ENCOUNTER — Encounter: Payer: Self-pay | Admitting: Hematology and Oncology

## 2022-12-06 ENCOUNTER — Inpatient Hospital Stay: Payer: BC Managed Care – PPO

## 2022-12-06 VITALS — BP 137/81 | HR 75 | Temp 97.7°F | Resp 16 | Wt 142.2 lb

## 2022-12-06 DIAGNOSIS — Z452 Encounter for adjustment and management of vascular access device: Secondary | ICD-10-CM | POA: Insufficient documentation

## 2022-12-06 DIAGNOSIS — Z79899 Other long term (current) drug therapy: Secondary | ICD-10-CM | POA: Insufficient documentation

## 2022-12-06 DIAGNOSIS — C50411 Malignant neoplasm of upper-outer quadrant of right female breast: Secondary | ICD-10-CM | POA: Insufficient documentation

## 2022-12-06 DIAGNOSIS — Z510 Encounter for antineoplastic radiation therapy: Secondary | ICD-10-CM | POA: Diagnosis not present

## 2022-12-06 DIAGNOSIS — Z5112 Encounter for antineoplastic immunotherapy: Secondary | ICD-10-CM | POA: Insufficient documentation

## 2022-12-06 DIAGNOSIS — Z170 Estrogen receptor positive status [ER+]: Secondary | ICD-10-CM | POA: Insufficient documentation

## 2022-12-06 DIAGNOSIS — Z95828 Presence of other vascular implants and grafts: Secondary | ICD-10-CM

## 2022-12-06 LAB — RAD ONC ARIA SESSION SUMMARY
Course Elapsed Days: 20
Plan Fractions Treated to Date: 15
Plan Prescribed Dose Per Fraction: 2.67 Gy
Plan Total Fractions Prescribed: 15
Plan Total Prescribed Dose: 40.05 Gy
Reference Point Dosage Given to Date: 40.05 Gy
Reference Point Session Dosage Given: 2.67 Gy
Session Number: 15

## 2022-12-06 MED ORDER — SODIUM CHLORIDE 0.9 % IV SOLN: Freq: Once | INTRAVENOUS | Status: AC

## 2022-12-06 MED ORDER — ACETAMINOPHEN 325 MG PO TABS
650.0000 mg | ORAL_TABLET | Freq: Once | ORAL | Status: AC
  Administered 2022-12-06: 650 mg via ORAL
  Filled 2022-12-06: qty 2

## 2022-12-06 MED ORDER — ALTEPLASE 2 MG IJ SOLR
2.0000 mg | Freq: Once | INTRAMUSCULAR | Status: AC
  Administered 2022-12-06: 2 mg
  Filled 2022-12-06: qty 2

## 2022-12-06 MED ORDER — TRASTUZUMAB-ANNS CHEMO 150 MG IV SOLR
6.0000 mg/kg | Freq: Once | INTRAVENOUS | Status: AC
  Administered 2022-12-06: 420 mg via INTRAVENOUS
  Filled 2022-12-06: qty 20

## 2022-12-06 MED ORDER — DIPHENHYDRAMINE HCL 25 MG PO CAPS
25.0000 mg | ORAL_CAPSULE | Freq: Once | ORAL | Status: AC
  Administered 2022-12-06: 25 mg via ORAL
  Filled 2022-12-06: qty 1

## 2022-12-06 NOTE — Progress Notes (Signed)
Rn unable to obtain blood return on port following 30 minutes of cathflow. Pt agreed to proceed with peripheral IV for Kanjinti today while waiting on cathflow to dwell for an hour. After 1 hour dwell time, RN was able to obtain blood return on port. Port de-accessed.

## 2022-12-06 NOTE — Patient Instructions (Signed)
[facility]  Discharge Instructions: Thank you for choosing [facility] to provide your oncology and hematology care.   If you have a lab appointment with the Cancer Center, please go directly to the Cancer Center and check in at the registration area.   Wear comfortable clothing and clothing appropriate for easy access to any Portacath or PICC line.   We strive to give you quality time with your provider. You may need to reschedule your appointment if you arrive late (15 or more minutes).  Arriving late affects you and other patients whose appointments are after yours.  Also, if you miss three or more appointments without notifying the office, you may be dismissed from the clinic at the provider's discretion.      For prescription refill requests, have your pharmacy contact our office and allow 72 hours for refills to be completed.    Today you received the following chemotherapy and/or immunotherapy agents: Kanjinti.      To help prevent nausea and vomiting after your treatment, we encourage you to take your nausea medication as directed.  BELOW ARE SYMPTOMS THAT SHOULD BE REPORTED IMMEDIATELY: *FEVER GREATER THAN 100.4 F (38 C) OR HIGHER *CHILLS OR SWEATING *NAUSEA AND VOMITING THAT IS NOT CONTROLLED WITH YOUR NAUSEA MEDICATION *UNUSUAL SHORTNESS OF BREATH *UNUSUAL BRUISING OR BLEEDING *URINARY PROBLEMS (pain or burning when urinating, or frequent urination) *BOWEL PROBLEMS (unusual diarrhea, constipation, pain near the anus) TENDERNESS IN MOUTH AND THROAT WITH OR WITHOUT PRESENCE OF ULCERS (sore throat, sores in mouth, or a toothache) UNUSUAL RASH, SWELLING OR PAIN  UNUSUAL VAGINAL DISCHARGE OR ITCHING   Items with * indicate a potential emergency and should be followed up as soon as possible or go to the Emergency Department if any problems should occur.  Please show the CHEMOTHERAPY ALERT CARD or IMMUNOTHERAPY ALERT CARD at  check-in to the Emergency Department and triage nurse.  Should you have questions after your visit or need to cancel or reschedule your appointment, please contact [facility]  Dept: [phone]  and follow the prompts.  Office hours are 8:00 a.m. to 4:30 p.m. Monday - Friday. Please note that voicemails left after 4:00 p.m. may not be returned until the following business day.  We are closed weekends and major holidays. You have access to a nurse at all times for urgent questions. Please call the main number to the clinic Dept: [phone] and follow the prompts.   For any non-urgent questions, you may also contact your provider using MyChart. We now offer e-Visits for anyone 18 and older to request care online for non-urgent symptoms. For details visit [email or web address].   Also download the MyChart app! Go to the app store, search "MyChart", open the app, select [facility], and log in with your MyChart username and password.   

## 2022-12-06 NOTE — Progress Notes (Signed)
[facility] CONSULT NOTE  Patient Care Team: [name], [name], MD as PCP - General (Family Medicine) Pershing Proud, RN as Oncology Nurse Navigator Donnelly Angelica, RN as Oncology Nurse Navigator [name], MD as Consulting Physician (Hematology and Oncology) Almond Lint, MD as Consulting Physician (General Surgery) Lonie Peak, MD as Attending Physician (Radiation Oncology)  CHIEF COMPLAINTS/PURPOSE OF CONSULTATION:  Breast cancer follow up.  HISTORY OF PRESENTING ILLNESS:  Dawn Bates 52 y.o. female is here because of recent diagnosis of right breast cancer  I reviewed her records extensively and collaborated the history with the patient.  SUMMARY OF ONCOLOGIC HISTORY: Oncology History  Malignant neoplasm of upper-outer quadrant of right breast in female, estrogen receptor positive (HCC)  05/26/2022 Mammogram   Diagnostic mammogram after patient noticed a palpable lump in the right breast showed suspicious mass at the palpable site of concern in the right breast at 10:00 measuring 1.2 cm. Ultrasound of the right breast showed no evidence of lymphadenopathy.   06/06/2022 Pathology Results   Right breast needle core biopsy at 10:00 showed overall grade 2 invasive ductal carcinoma.  Prognostic showed ER 100% positive strong staining PR 5% positive strong staining, Ki-67 of 50% HER2 3+ by [facility]   06/06/2022 Cancer Staging   Staging form: Breast, AJCC 8th Edition - Clinical stage from 06/06/2022: Stage IA (cT1c, cN0, cM0, G3, ER+, PR+, HER2+) - Signed by [name], MD on 07/19/2022 Stage prefix: Initial diagnosis Histologic grading system: 3 grade system   06/26/2022 Genetic Testing   Negative [facility] CancerNext Expanded Panel.  Report date is 06/26/2022.   The CancerNext-Expanded gene panel offered by [facility] and includes sequencing and rearrangement analysis for the following 77 genes: AIP, ALK, APC, ATM, AXIN2, BAP1, BARD1, BLM, BMPR1A,  BRCA1, BRCA2, BRIP1, CDC73, CDH1, CDK4, CDKN1B, CDKN2A, CHEK2, CTNNA1, DICER1, FANCC, FH, FLCN, GALNT12, KIF1B, LZTR1, MAX, MEN1, MET, MLH1, MSH2, MSH3, MSH6, MUTYH, NBN, NF1, NF2, NTHL1, PALB2, PHOX2B, PMS2, POT1, PRKAR1A, PTCH1, PTEN, RAD51C, RAD51D, RB1, RECQL, RET, SDHA, SDHAF2, SDHB, SDHC, SDHD, SMAD4, SMARCA4, SMARCB1, SMARCE1, STK11, SUFU, TMEM127, TP53, TSC1, TSC2, VHL and XRCC2 (sequencing and deletion/duplication); EGFR, EGLN1, HOXB13, KIT, MITF, PDGFRA, POLD1, and POLE (sequencing only); EPCAM and GREM1 (deletion/duplication only).    06/28/2022 Initial Diagnosis   Malignant neoplasm of upper-outer quadrant of right breast in female, estrogen receptor positive (HCC)   07/05/2022 Surgery   Right breast lumpectomy: 1.3 cm invasive poorly differentiated ductal carcinoma grade 3, margins negative, 2 sentinel lymph nodes negative   07/05/2022 Cancer Staging   Staging form: Breast, AJCC 8th Edition - Pathologic stage from 07/05/2022: Stage IA (pT1c, pN0, cM0, G3, ER+, PR+, HER2+) - Signed by [name], NP on 08/02/2022 Stage prefix: Initial diagnosis Histologic grading system: 3 grade system   08/02/2022 -  Chemotherapy   Patient is on Treatment Plan : BREAST Paclitaxel + Trastuzumab q7d / Trastuzumab q21d      Dawn Bates is here for follow-up after completing chemotherapy.   She is now going to be on every 21-day Herceptin. She complains of random spells of herceptin, no triggers particularly, benadryl helps it. She is happy that hair is growing back. She will be completing radiation end of June.  Rest of the pertinent 10 point ROS reviewed and negative.  MEDICAL HISTORY:  Past Medical History:  Diagnosis Date   Abnormal uterine bleeding 2022   Anemia 02/12/2021   Patient is taking iron supplementation.   Cancer (HCC)  breast   COVID-19 2020   cold-like symptoms   Endometriosis    Fibroids    Graves disease    Hypertension    Hyperthyroidism 2022   s/p  Nuclear Medicine RAI therapy on 08/07/20   Seasonal allergies    severe chronic allergies, runny nose, congestion, cough when she lays down due to drainage   Vitamin D deficiency 2021   Wears glasses     SURGICAL HISTORY: Past Surgical History:  Procedure Laterality Date   BREAST BIOPSY Right 06/06/2022   [place] RT BREAST BX W LOC DEV 1ST LESION IMG BX SPEC US GUIDE 06/06/2022 [facility] MAMMOGRAPHY   BREAST LUMPECTOMY WITH SENTINEL LYMPH NODE BIOPSY Right 07/05/2022   Procedure: RIGHT BREAST LUMPECTOMY WITH SENTINEL LYMPH NODE BX;  Surgeon: Almond Lint, MD;  Location: [facility];  Service: General;  Laterality: Right;   CYSTOSCOPY  10/04/2021   Procedure: CYSTOSCOPY;  Surgeon: Jaymes Graff, MD;  Location: [facility];  Service: Gynecology;;   LAPAROSCOPIC VAGINAL HYSTERECTOMY WITH SALPINGECTOMY N/A 10/04/2021   Procedure: LAPAROSCOPIC ASSISTED VAGINAL HYSTERECTOMY WITH SALPINGECTOMY;  Surgeon: Jaymes Graff, MD;  Location: [facility];  Service: Gynecology;  Laterality: N/A;   PORTACATH PLACEMENT Left 07/05/2022   Procedure: INSERTION PORT-A-CATH;  Surgeon: Almond Lint, MD;  Location:  SURGERY CENTER;  Service: General;  Laterality: Left;   TUBAL LIGATION  09/2008    SOCIAL HISTORY: Social History   Socioeconomic History   Marital status: Married    Spouse name: Not on file   Number of children: Not on file   Years of education: Not on file   Highest education level: Not on file  Occupational History   Not on file  Tobacco Use   Smoking status: Never   Smokeless tobacco: Never  Vaping Use   Vaping Use: Never used  Substance and Sexual Activity   Alcohol use: No   Drug use: No   Sexual activity: Yes    Partners: Male    Birth control/protection: Surgical    Comment: BTL   Other Topics Concern   Not on file  Social History Narrative   Not on file   Social Determinants of Health   Financial Resource Strain: Not on  file  Food Insecurity: Not on file  Transportation Needs: Not on file  Physical Activity: Not on file  Stress: Not on file  Social Connections: Not on file  Intimate Partner Violence: Not on file    FAMILY HISTORY: Family History  Problem Relation Age of Onset   Breast cancer Sister 59       triple negative; d. early 50s   Cancer Paternal Aunt        unknown type   Thyroid cancer Half-Sister        mat half sister; dx 51s; unknown cell type   Maternal uncle with prostate cancer, multiple myeloma.  ALLERGIES:  is allergic to latex.  MEDICATIONS:  Current Outpatient Medications  Medication Sig Dispense Refill   acetaminophen (TYLENOL) 500 MG tablet Take 500 mg by mouth every 8 (eight) hours as needed for mild pain ([place]).     amLODipine (NORVASC) 5 MG tablet TAKE 1 TABLET (5 MG TOTAL) BY MOUTH DAILY. 90 tablet 1   Calcium-Phosphorus-Vitamin D (CITRACAL +D3 PO) Take by mouth.     ferrous sulfate 325 (65 FE) MG tablet Take 325 mg by mouth daily with breakfast.     levothyroxine (SYNTHROID) 50 MCG tablet Take 1 tablet (  50 mcg total) by mouth daily. 45 tablet 3   lidocaine-prilocaine (EMLA) cream Apply to affected area once 30 g 3   prochlorperazine (COMPAZINE) 10 MG tablet Take 1 tablet (10 mg total) by mouth every 6 (six) hours as needed for nausea or vomiting. 30 tablet 1   valACYclovir (VALTREX) 500 MG tablet Take 500 mg by mouth as needed.     No current facility-administered medications for this visit.    REVIEW OF SYSTEMS:   Constitutional: Denies fevers, chills or abnormal night sweats Eyes: Denies blurriness of vision, double vision or watery eyes Ears, nose, mouth, throat, and face: Denies mucositis or sore throat Respiratory: Denies cough, dyspnea or wheezes Cardiovascular: Denies palpitation, chest discomfort or lower extremity swelling Gastrointestinal:  Denies nausea, heartburn or change in bowel habits Skin: Denies abnormal skin rashes Lymphatics: Denies  new lymphadenopathy or easy bruising Neurological:Denies numbness, tingling or new weaknesses Behavioral/Psych: Mood is stable, no new changes  Breast:  Denies any palpable lumps or discharge All other systems were reviewed with the patient and are negative.  PHYSICAL EXAMINATION: ECOG PERFORMANCE STATUS: 0 - Asymptomatic  BP 137/81 (BP Location: Left Arm, Patient Position: Sitting)   Pulse 75   Temp 97.7 F (36.5 C) (Tympanic)   Resp 16   Wt 142 lb 3.2 oz (64.5 kg)   LMP 09/09/2021 (Exact Date)   SpO2 100%   BMI 24.28 kg/m   General appearance: Alert, oriented and in no acute distress NO LE swelling   LABORATORY DATA:  I have reviewed the data as listed Lab Results  Component Value Date   WBC 4.7 10/25/2022   HGB 9.1 (L) 10/25/2022   HCT 29.2 (L) 10/25/2022   MCV 85.6 10/25/2022   PLT 211 10/25/2022   Lab Results  Component Value Date   NA 138 10/25/2022   K 3.6 10/25/2022   CL 105 10/25/2022   CO2 27 10/25/2022    RADIOGRAPHIC STUDIES: I have personally reviewed the radiological reports and agreed with the findings in the report.  ASSESSMENT AND PLAN:  Malignant neoplasm of upper-outer quadrant of right breast in female, estrogen receptor positive (HCC) This is a very pleasant 52 year old premenopausal female patient with newly diagnosed right breast invasive ductal carcinoma T1 N0 M0 ER/PR and HER2 amplified referred to medical oncology for recommendations.   Given triple positive disease, we have discussed about considering adjuvant chemotherapy.  She now here after surgery, final pathology showed tumor measuring 1.3 x 1.2 x 1.0 cm, negative margins, negative lymph nodes.  She has now completed weekly Herceptin and Taxol and will continue on every 21-day Herceptin. ECHO reviewed, satisfactory EF stable 60-65%, next one due in July. We have once again discussed about the role of maintenance herceptin.  She is now undergoing adjuvant radiation. She will complete  radiation end of June.  She will start tamoxifen ( she is premenopausal) after completing radiation. We have discussed mechanism of action of tamoxifen, adverse effects of Tamoxifen including but not limited to premenopausal symptoms such as hot flashes, vaginal discharge, risk of DVT/PE and endometrial complications in certain patients, benefit on bone density and reduce risk of breast cancer recurrence.  We have discussed the symptoms and signs of DVT and PE as well.  She will return to clinic on July 9 to see one of our nurse practitioners and we can send the prescription at that time to initiate tamoxifen.  We have also talked about ovarian suppression with aromatase inhibitors as an alternative choice  but she is not interested in this approach at this time.  Okay to proceed with Herceptin today.  She is next due for an echocardiogram in July. [name] MD   Total time spent: 30 minutes including history, physical exam, review of records, counseling and coordination of care All questions were answered. The patient knows to call the clinic with any problems, questions or concerns.    [name], MD 12/06/22

## 2022-12-06 NOTE — Assessment & Plan Note (Addendum)
This is a very pleasant 52 year old premenopausal female patient with newly diagnosed right breast invasive ductal carcinoma T1 N0 M0 ER/PR and HER2 amplified referred to medical oncology for recommendations.   Given triple positive disease, we have discussed about considering adjuvant chemotherapy.  She now here after surgery, final pathology showed tumor measuring 1.3 x 1.2 x 1.0 cm, negative margins, negative lymph nodes.  She has now completed weekly Herceptin and Taxol and will continue on every 21-day Herceptin. ECHO reviewed, satisfactory EF stable 60-65%, next one due in July. We have once again discussed about the role of maintenance herceptin.  She is now undergoing adjuvant radiation. She will complete radiation end of June.  She will start tamoxifen ( she is premenopausal) after completing radiation. We have discussed mechanism of action of tamoxifen, adverse effects of Tamoxifen including but not limited to premenopausal symptoms such as hot flashes, vaginal discharge, risk of DVT/PE and endometrial complications in certain patients, benefit on bone density and reduce risk of breast cancer recurrence.  We have discussed the symptoms and signs of DVT and PE as well.  She will return to clinic on July 9 to see one of our nurse practitioners and we can send the prescription at that time to initiate tamoxifen.  We have also talked about ovarian suppression with aromatase inhibitors as an alternative choice but she is not interested in this approach at this time.  Okay to proceed with Herceptin today.  She is next due for an echocardiogram in July. [name] MD

## 2022-12-07 ENCOUNTER — Ambulatory Visit
Admission: RE | Admit: 2022-12-07 | Discharge: 2022-12-07 | Disposition: A | Discharge status: Home / Self Care | Payer: BC Managed Care – PPO | Source: Ambulatory Visit | Attending: Radiation Oncology | Admitting: Radiation Oncology

## 2022-12-07 ENCOUNTER — Other Ambulatory Visit: Payer: Self-pay

## 2022-12-07 DIAGNOSIS — Z510 Encounter for antineoplastic radiation therapy: Secondary | ICD-10-CM | POA: Diagnosis not present

## 2022-12-07 LAB — RAD ONC ARIA SESSION SUMMARY
Course Elapsed Days: 21
Plan Fractions Treated to Date: 1
Plan Prescribed Dose Per Fraction: 2 Gy
Plan Total Fractions Prescribed: 5
Plan Total Prescribed Dose: 10 Gy
Reference Point Dosage Given to Date: 2 Gy
Reference Point Session Dosage Given: 2 Gy
Session Number: 16

## 2022-12-08 ENCOUNTER — Other Ambulatory Visit: Payer: Self-pay

## 2022-12-08 ENCOUNTER — Ambulatory Visit
Admission: RE | Admit: 2022-12-08 | Discharge: 2022-12-08 | Disposition: A | Discharge status: Home / Self Care | Payer: BC Managed Care – PPO | Source: Ambulatory Visit | Attending: Radiation Oncology | Admitting: Radiation Oncology

## 2022-12-08 DIAGNOSIS — Z510 Encounter for antineoplastic radiation therapy: Secondary | ICD-10-CM | POA: Diagnosis not present

## 2022-12-08 LAB — RAD ONC ARIA SESSION SUMMARY
Course Elapsed Days: 22
Plan Fractions Treated to Date: 2
Plan Prescribed Dose Per Fraction: 2 Gy
Plan Total Fractions Prescribed: 5
Plan Total Prescribed Dose: 10 Gy
Reference Point Dosage Given to Date: 4 Gy
Reference Point Session Dosage Given: 2 Gy
Session Number: 17

## 2022-12-09 ENCOUNTER — Other Ambulatory Visit: Payer: Self-pay

## 2022-12-09 ENCOUNTER — Ambulatory Visit
Admission: RE | Admit: 2022-12-09 | Discharge: 2022-12-09 | Disposition: A | Discharge status: Home / Self Care | Payer: BC Managed Care – PPO | Source: Ambulatory Visit | Attending: Radiation Oncology | Admitting: Radiation Oncology

## 2022-12-09 DIAGNOSIS — Z510 Encounter for antineoplastic radiation therapy: Secondary | ICD-10-CM | POA: Diagnosis not present

## 2022-12-09 LAB — RAD ONC ARIA SESSION SUMMARY
Course Elapsed Days: 23
Plan Fractions Treated to Date: 3
Plan Prescribed Dose Per Fraction: 2 Gy
Plan Total Fractions Prescribed: 5
Plan Total Prescribed Dose: 10 Gy
Reference Point Dosage Given to Date: 6 Gy
Reference Point Session Dosage Given: 2 Gy
Session Number: 18

## 2022-12-12 ENCOUNTER — Other Ambulatory Visit: Payer: Self-pay

## 2022-12-12 ENCOUNTER — Ambulatory Visit
Admission: RE | Admit: 2022-12-12 | Discharge: 2022-12-12 | Disposition: A | Discharge status: Home / Self Care | Payer: BC Managed Care – PPO | Source: Ambulatory Visit | Attending: Radiation Oncology | Admitting: Radiation Oncology

## 2022-12-12 DIAGNOSIS — Z510 Encounter for antineoplastic radiation therapy: Secondary | ICD-10-CM | POA: Diagnosis not present

## 2022-12-12 LAB — RAD ONC ARIA SESSION SUMMARY
Course Elapsed Days: 26
Plan Fractions Treated to Date: 4
Plan Prescribed Dose Per Fraction: 2 Gy
Plan Total Fractions Prescribed: 5
Plan Total Prescribed Dose: 10 Gy
Reference Point Dosage Given to Date: 8 Gy
Reference Point Session Dosage Given: 2 Gy
Session Number: 19

## 2022-12-13 ENCOUNTER — Ambulatory Visit
Admission: RE | Admit: 2022-12-13 | Discharge: 2022-12-13 | Disposition: A | Discharge status: Home / Self Care | Payer: BC Managed Care – PPO | Source: Ambulatory Visit | Attending: Radiation Oncology | Admitting: Radiation Oncology

## 2022-12-13 ENCOUNTER — Other Ambulatory Visit: Payer: Self-pay

## 2022-12-13 DIAGNOSIS — Z510 Encounter for antineoplastic radiation therapy: Secondary | ICD-10-CM | POA: Diagnosis not present

## 2022-12-13 LAB — RAD ONC ARIA SESSION SUMMARY
Course Elapsed Days: 27
Plan Fractions Treated to Date: 5
Plan Prescribed Dose Per Fraction: 2 Gy
Plan Total Fractions Prescribed: 5
Plan Total Prescribed Dose: 10 Gy
Reference Point Dosage Given to Date: 10 Gy
Reference Point Session Dosage Given: 2 Gy
Session Number: 20

## 2022-12-14 NOTE — Radiation Completion Notes (Signed)
Patient Name: Dawn Bates, Dawn Bates MRN: [id number] Date of Birth: August 07, 1970 Referring Physician: BETTY [name], M.D. Date of Service: 2022-12-14 Radiation Oncologist: Lonie Peak, M.D. [facility]                             RADIATION ONCOLOGY END OF TREATMENT NOTE     Diagnosis: C50.411 Malignant neoplasm of upper-outer quadrant of right female breast Staging on 2022-07-05: Malignant neoplasm of upper-outer quadrant of right breast in female, estrogen receptor positive (HCC) T=pT1c, N=pN0, M=cM0 Staging on 2022-06-06: Malignant neoplasm of upper-outer quadrant of right breast in female, estrogen receptor positive (HCC) T=cT1c, N=cN0, M=cM0 Intent: Curative     ==========DELIVERED PLANS==========  First Treatment Date: 2022-11-16 - Last Treatment Date: 2022-12-13   Plan Name: Breast_R Site: Breast, Right Technique: 3D Mode: Photon Dose Per Fraction: 2.67 Gy Prescribed Dose (Delivered / Prescribed): 40.05 Gy / 40.05 Gy Prescribed Fxs (Delivered / Prescribed): 15 / 15   Plan Name: Breast_R_Bst Site: Breast, Right Technique: 3D Mode: Photon Dose Per Fraction: 2 Gy Prescribed Dose (Delivered / Prescribed): 10 Gy / 10 Gy Prescribed Fxs (Delivered / Prescribed): 5 / 5     ==========ON TREATMENT VISIT DATES========== 2022-11-21, 2022-11-25, 2022-12-05, 2022-12-12     ==========UPCOMING VISITS==========       ==========APPENDIX - ON TREATMENT VISIT NOTES==========   See weekly On Treatment Notes in Epic for details.

## 2022-12-27 ENCOUNTER — Inpatient Hospital Stay: Payer: BC Managed Care – PPO | Admitting: Adult Health

## 2022-12-27 ENCOUNTER — Inpatient Hospital Stay: Payer: BC Managed Care – PPO

## 2022-12-27 ENCOUNTER — Encounter: Payer: Self-pay | Admitting: Adult Health

## 2022-12-27 ENCOUNTER — Other Ambulatory Visit: Payer: Self-pay

## 2022-12-27 ENCOUNTER — Other Ambulatory Visit: Payer: BC Managed Care – PPO

## 2022-12-27 ENCOUNTER — Inpatient Hospital Stay: Payer: BC Managed Care – PPO | Attending: Hematology and Oncology

## 2022-12-27 VITALS — BP 126/82 | HR 65 | Temp 97.6°F | Resp 16

## 2022-12-27 VITALS — BP 128/83 | HR 69 | Temp 97.5°F | Resp 18 | Ht 64.17 in | Wt 146.6 lb

## 2022-12-27 DIAGNOSIS — Z170 Estrogen receptor positive status [ER+]: Secondary | ICD-10-CM | POA: Insufficient documentation

## 2022-12-27 DIAGNOSIS — Z5112 Encounter for antineoplastic immunotherapy: Secondary | ICD-10-CM | POA: Insufficient documentation

## 2022-12-27 DIAGNOSIS — Z79899 Other long term (current) drug therapy: Secondary | ICD-10-CM | POA: Diagnosis not present

## 2022-12-27 DIAGNOSIS — D500 Iron deficiency anemia secondary to blood loss (chronic): Secondary | ICD-10-CM | POA: Diagnosis not present

## 2022-12-27 DIAGNOSIS — Z95828 Presence of other vascular implants and grafts: Secondary | ICD-10-CM

## 2022-12-27 DIAGNOSIS — C50411 Malignant neoplasm of upper-outer quadrant of right female breast: Secondary | ICD-10-CM

## 2022-12-27 LAB — CBC WITH DIFFERENTIAL (CANCER CENTER ONLY)
Abs Immature Granulocytes: 0.01 10*3/uL (ref 0.00–0.07)
Basophils Absolute: 0 10*3/uL (ref 0.0–0.1)
Basophils Relative: 1 %
Eosinophils Absolute: 0.2 10*3/uL (ref 0.0–0.5)
Eosinophils Relative: 4 %
HCT: 33.2 % — ABNORMAL LOW (ref 36.0–46.0)
Hemoglobin: 9.9 g/dL — ABNORMAL LOW (ref 12.0–15.0)
Immature Granulocytes: 0 %
Lymphocytes Relative: 30 %
Lymphs Abs: 1.4 10*3/uL (ref 0.7–4.0)
MCH: 25.1 pg — ABNORMAL LOW (ref 26.0–34.0)
MCHC: 29.8 g/dL — ABNORMAL LOW (ref 30.0–36.0)
MCV: 84.3 fL (ref 80.0–100.0)
Monocytes Absolute: 0.3 10*3/uL (ref 0.1–1.0)
Monocytes Relative: 6 %
Neutro Abs: 2.7 10*3/uL (ref 1.7–7.7)
Neutrophils Relative %: 59 %
Platelet Count: 188 10*3/uL (ref 150–400)
RBC: 3.94 MIL/uL (ref 3.87–5.11)
RDW: 15.9 % — ABNORMAL HIGH (ref 11.5–15.5)
WBC Count: 4.5 10*3/uL (ref 4.0–10.5)
nRBC: 0 % (ref 0.0–0.2)

## 2022-12-27 LAB — IRON AND IRON BINDING CAPACITY (CC-WL,HP ONLY)
Iron: 29 ug/dL (ref 28–170)
Saturation Ratios: 7 % — ABNORMAL LOW (ref 10.4–31.8)
TIBC: 442 ug/dL (ref 250–450)
UIBC: 413 ug/dL (ref 148–442)

## 2022-12-27 LAB — CMP (CANCER CENTER ONLY)
ALT: 15 U/L (ref 0–44)
AST: 18 U/L (ref 15–41)
Albumin: 3.8 g/dL (ref 3.5–5.0)
Alkaline Phosphatase: 72 U/L (ref 38–126)
Anion gap: 6 (ref 5–15)
BUN: 17 mg/dL (ref 6–20)
CO2: 27 mmol/L (ref 22–32)
Calcium: 9.7 mg/dL (ref 8.9–10.3)
Chloride: 107 mmol/L (ref 98–111)
Creatinine: 0.88 mg/dL (ref 0.44–1.00)
GFR, Estimated: >60 mL/min (ref >60)
Glucose, Bld: 85 mg/dL (ref 70–99)
Potassium: 3.7 mmol/L (ref 3.5–5.1)
Sodium: 140 mmol/L (ref 135–145)
Total Bilirubin: 0.4 mg/dL (ref 0.3–1.2)
Total Protein: 7.2 g/dL (ref 6.5–8.1)

## 2022-12-27 LAB — RETIC PANEL
Immature Retic Fract: 17.6 % — ABNORMAL HIGH (ref 2.3–15.9)
RBC.: 3.85 MIL/uL — ABNORMAL LOW (ref 3.87–5.11)
Retic Count, Absolute: 35.8 10*3/uL (ref 19.0–186.0)
Retic Ct Pct: 0.9 % (ref 0.4–3.1)
Reticulocyte Hemoglobin: 23.9 pg — ABNORMAL LOW (ref >27.9)

## 2022-12-27 LAB — VITAMIN B12: Vitamin B-12: 228 pg/mL (ref 180–914)

## 2022-12-27 MED ORDER — SODIUM CHLORIDE 0.9% FLUSH
10.0000 mL | Freq: Once | INTRAVENOUS | Status: AC
  Administered 2022-12-27: 10 mL

## 2022-12-27 MED ORDER — TRASTUZUMAB-ANNS CHEMO 150 MG IV SOLR
6.0000 mg/kg | Freq: Once | INTRAVENOUS | Status: AC
  Administered 2022-12-27: 420 mg via INTRAVENOUS
  Filled 2022-12-27: qty 20

## 2022-12-27 MED ORDER — HEPARIN SOD (PORK) LOCK FLUSH 100 UNIT/ML IV SOLN
500.0000 [IU] | Freq: Once | INTRAVENOUS | Status: AC | PRN
  Administered 2022-12-27: 500 [IU]

## 2022-12-27 MED ORDER — ACETAMINOPHEN 325 MG PO TABS
650.0000 mg | ORAL_TABLET | Freq: Once | ORAL | Status: AC
  Administered 2022-12-27: 650 mg via ORAL
  Filled 2022-12-27: qty 2

## 2022-12-27 MED ORDER — TAMOXIFEN CITRATE 10 MG PO TABS
10.0000 mg | ORAL_TABLET | Freq: Two times a day (BID) | ORAL | 0 refills | Status: DC
Start: 2022-12-27 — End: 2023-02-06

## 2022-12-27 MED ORDER — SODIUM CHLORIDE 0.9 % IV SOLN: Freq: Once | INTRAVENOUS | Status: AC

## 2022-12-27 MED ORDER — DIPHENHYDRAMINE HCL 25 MG PO CAPS
25.0000 mg | ORAL_CAPSULE | Freq: Once | ORAL | Status: AC
  Administered 2022-12-27: 25 mg via ORAL
  Filled 2022-12-27: qty 1

## 2022-12-27 MED ORDER — SODIUM CHLORIDE 0.9% FLUSH
10.0000 mL | INTRAVENOUS | Status: DC | PRN
  Administered 2022-12-27: 10 mL

## 2022-12-27 NOTE — Patient Instructions (Signed)
[facility]  Discharge Instructions: Thank you for choosing [facility] to provide your oncology and hematology care.   If you have a lab appointment with the Cancer Center, please go directly to the Cancer Center and check in at the registration area.   Wear comfortable clothing and clothing appropriate for easy access to any Portacath or PICC line.   We strive to give you quality time with your provider. You may need to reschedule your appointment if you arrive late (15 or more minutes).  Arriving late affects you and other patients whose appointments are after yours.  Also, if you miss three or more appointments without notifying the office, you may be dismissed from the clinic at the provider's discretion.      For prescription refill requests, have your pharmacy contact our office and allow 72 hours for refills to be completed.    Today you received the following chemotherapy and/or immunotherapy agent: Tarstuzumab (Kanjinti)   To help prevent nausea and vomiting after your treatment, we encourage you to take your nausea medication as directed.  BELOW ARE SYMPTOMS THAT SHOULD BE REPORTED IMMEDIATELY: *FEVER GREATER THAN 100.4 F (38 C) OR HIGHER *CHILLS OR SWEATING *NAUSEA AND VOMITING THAT IS NOT CONTROLLED WITH YOUR NAUSEA MEDICATION *UNUSUAL SHORTNESS OF BREATH *UNUSUAL BRUISING OR BLEEDING *URINARY PROBLEMS (pain or burning when urinating, or frequent urination) *BOWEL PROBLEMS (unusual diarrhea, constipation, pain near the anus) TENDERNESS IN MOUTH AND THROAT WITH OR WITHOUT PRESENCE OF ULCERS (sore throat, sores in mouth, or a toothache) UNUSUAL RASH, SWELLING OR PAIN  UNUSUAL VAGINAL DISCHARGE OR ITCHING   Items with * indicate a potential emergency and should be followed up as soon as possible or go to the Emergency Department if any problems should occur.  Please show the CHEMOTHERAPY ALERT CARD or IMMUNOTHERAPY ALERT CARD  at check-in to the Emergency Department and triage nurse.  Should you have questions after your visit or need to cancel or reschedule your appointment, please contact [facility]  Dept: [phone]  and follow the prompts.  Office hours are 8:00 a.m. to 4:30 p.m. Monday - Friday. Please note that voicemails left after 4:00 p.m. may not be returned until the following business day.  We are closed weekends and major holidays. You have access to a nurse at all times for urgent questions. Please call the main number to the clinic Dept: [phone] and follow the prompts.   For any non-urgent questions, you may also contact your provider using MyChart. We now offer e-Visits for anyone 86 and older to request care online for non-urgent symptoms. For details visit [email or web address].   Also download the MyChart app! Go to the app store, search "MyChart", open the app, select [facility], and log in with your MyChart username and password.  Trastuzumab Injection What is this medication? TRASTUZUMAB (tras TOO zoo mab) treats breast cancer and stomach cancer. It works by blocking a protein that causes cancer cells to grow and multiply. This helps to slow or stop the spread of cancer cells. This medicine may be used for other purposes; ask your health care provider or pharmacist if you have questions. COMMON BRAND NAME(S): Herceptin, Marlowe Alt, Ontruzant, Trazimera What should I tell my care team before I take this medication? They need to know if you have any of these conditions: Heart failure Lung disease An unusual or allergic reaction to trastuzumab, other medications, foods, dyes,  or preservatives Pregnant or trying to get pregnant Breast-feeding How should I use this medication? This medication is injected into a vein. It is given by your care team in a hospital or clinic setting. Talk to your care team about the use of this medication in  children. It is not approved for use in children. Overdosage: If you think you have taken too much of this medicine contact a poison control center or emergency room at once. NOTE: This medicine is only for you. Do not share this medicine with others. What if I miss a dose? Keep appointments for follow-up doses. It is important not to miss your dose. Call your care team if you are unable to keep an appointment. What may interact with this medication? Certain types of chemotherapy, such as daunorubicin, doxorubicin, epirubicin, idarubicin This list may not describe all possible interactions. Give your health care provider a list of all the medicines, herbs, non-prescription drugs, or dietary supplements you use. Also tell them if you smoke, drink alcohol, or use illegal drugs. Some items may interact with your medicine. What should I watch for while using this medication? Your condition will be monitored carefully while you are receiving this medication. This medication may make you feel generally unwell. This is not uncommon, as chemotherapy affects healthy cells as well as cancer cells. Report any side effects. Continue your course of treatment even though you feel ill unless your care team tells you to stop. This medication may increase your risk of getting an infection. Call your care team for advice if you get a fever, chills, sore throat, or other symptoms of a cold or flu. Do not treat yourself. Try to avoid being around people who are sick. Avoid taking medications that contain aspirin, acetaminophen, ibuprofen, naproxen, or ketoprofen unless instructed by your care team. These medications can hide a fever. Talk to your care team if you may be pregnant. Serious birth defects can occur if you take this medication during pregnancy and for 7 months after the last dose. You will need a negative pregnancy test before starting this medication. Contraception is recommended while taking this medication  and for 7 months after the last dose. Your care team can help you find the option that works for you. Do not breastfeed while taking this medication and for 7 months after stopping treatment. What side effects may I notice from receiving this medication? Side effects that you should report to your care team as soon as possible: Allergic reactions or angioedema--skin rash, itching or hives, swelling of the face, eyes, lips, tongue, arms, or legs, trouble swallowing or breathing Dry cough, shortness of breath or trouble breathing Heart failure--shortness of breath, swelling of the ankles, feet, or hands, sudden weight gain, unusual weakness or fatigue Infection--fever, chills, cough, or sore throat Infusion reactions--chest pain, shortness of breath or trouble breathing, feeling faint or lightheaded Side effects that usually do not require medical attention (report to your care team if they continue or are bothersome): Diarrhea Dizziness Headache Nausea Trouble sleeping Vomiting This list may not describe all possible side effects. Call your doctor for medical advice about side effects. You may report side effects to FDA at 1-800-FDA-1088. Where should I keep my medication? This medication is given in a hospital or clinic. It will not be stored at home. NOTE: This sheet is a summary. It may not cover all possible information. If you have questions about this medicine, talk to your doctor, pharmacist, or health care provider.  2024 Elsevier/Gold Standard (2021-10-19 00:00:00)

## 2022-12-27 NOTE — Progress Notes (Signed)
[facility] Cancer Follow up:    [name], [name], MD [address] [place] [address]   DIAGNOSIS: Cancer Staging  Malignant neoplasm of upper-outer quadrant of right breast in female, estrogen receptor positive (HCC) Staging form: Breast, AJCC 8th Edition - Clinical stage from 06/06/2022: Stage IA (cT1c, cN0, cM0, G3, ER+, PR+, HER2+) - Signed by [name], MD on 07/19/2022 Stage prefix: Initial diagnosis Histologic grading system: 3 grade system - Pathologic stage from 07/05/2022: Stage IA (pT1c, pN0, cM0, G3, ER+, PR+, HER2+) - Signed by [name], NP on 08/02/2022 Stage prefix: Initial diagnosis Histologic grading system: 3 grade system   SUMMARY OF ONCOLOGIC HISTORY: Oncology History  Malignant neoplasm of upper-outer quadrant of right breast in female, estrogen receptor positive (HCC)  05/26/2022 Mammogram   Diagnostic mammogram after patient noticed a palpable lump in the right breast showed suspicious mass at the palpable site of concern in the right breast at 10:00 measuring 1.2 cm. Ultrasound of the right breast showed no evidence of lymphadenopathy.   06/06/2022 Pathology Results   Right breast needle core biopsy at 10:00 showed overall grade 2 invasive ductal carcinoma.  Prognostic showed ER 100% positive strong staining PR 5% positive strong staining, Ki-67 of 50% HER2 3+ by [facility]   06/06/2022 Cancer Staging   Staging form: Breast, AJCC 8th Edition - Clinical stage from 06/06/2022: Stage IA (cT1c, cN0, cM0, G3, ER+, PR+, HER2+) - Signed by [name], MD on 07/19/2022 Stage prefix: Initial diagnosis Histologic grading system: 3 grade system   06/26/2022 Genetic Testing   Negative [facility] CancerNext Expanded Panel.  Report date is 06/26/2022.   The CancerNext-Expanded gene panel offered by [facility] and includes sequencing and rearrangement analysis for the following 77 genes: AIP, ALK, APC, ATM, AXIN2, BAP1, BARD1, BLM,  BMPR1A, BRCA1, BRCA2, BRIP1, CDC73, CDH1, CDK4, CDKN1B, CDKN2A, CHEK2, CTNNA1, DICER1, FANCC, FH, FLCN, GALNT12, KIF1B, LZTR1, MAX, MEN1, MET, MLH1, MSH2, MSH3, MSH6, MUTYH, NBN, NF1, NF2, NTHL1, PALB2, PHOX2B, PMS2, POT1, PRKAR1A, PTCH1, PTEN, RAD51C, RAD51D, RB1, RECQL, RET, SDHA, SDHAF2, SDHB, SDHC, SDHD, SMAD4, SMARCA4, SMARCB1, SMARCE1, STK11, SUFU, TMEM127, TP53, TSC1, TSC2, VHL and XRCC2 (sequencing and deletion/duplication); EGFR, EGLN1, HOXB13, KIT, MITF, PDGFRA, POLD1, and POLE (sequencing only); EPCAM and GREM1 (deletion/duplication only).    06/28/2022 Initial Diagnosis   Malignant neoplasm of upper-outer quadrant of right breast in female, estrogen receptor positive (HCC)   07/05/2022 Surgery   Right breast lumpectomy: 1.3 cm invasive poorly differentiated ductal carcinoma grade 3, margins negative, 2 sentinel lymph nodes negative   07/05/2022 Cancer Staging   Staging form: Breast, AJCC 8th Edition - Pathologic stage from 07/05/2022: Stage IA (pT1c, pN0, cM0, G3, ER+, PR+, HER2+) - Signed by [name], NP on 08/02/2022 Stage prefix: Initial diagnosis Histologic grading system: 3 grade system   08/02/2022 -  Chemotherapy   Patient is on Treatment Plan : BREAST Paclitaxel + Trastuzumab q7d / Trastuzumab q21d       CURRENT THERAPY:  INTERVAL HISTORY: Dawn Bates 52 y.o. female returns for follow-up prior to receiving Herceptin.  Her most recent echocardiogram occurred on October 17, 2022 and demonstrated a left ventricular ejection fraction of 60 to 65%.   Patient Active Problem List   Diagnosis Date Noted  . Routine general medical examination at a health care facility 10/19/2022  . Chronic idiopathic constipation 09/30/2022  . Iron deficiency anemia 09/30/2022  . Port-A-Cath in place 08/02/2022  . Genetic testing 07/20/2022  .  Malignant neoplasm of upper-outer quadrant of right breast in female, estrogen receptor positive (HCC) 06/28/2022  . Family history  of breast cancer 06/21/2022  . Abnormal uterine and vaginal bleeding, unspecified 10/04/2021  . S/P laparoscopic assisted vaginal hysterectomy (LAVH) 10/04/2021  . Hypothyroidism 07/28/2021  . Menorrhagia 07/28/2021  . Bilateral lower extremity edema 04/23/2021  . Primary hypertension 02/12/2021  . Elevated LFTs 05/22/2020  . Thyroid nodule 10/10/2019  . Headache, unspecified headache type 07/16/2019  . Non-seasonal allergic rhinitis due to pollen 07/16/2019  . Vitamin D deficiency, unspecified 07/16/2019  . Hyperlipidemia 07/16/2019  . Anemia 08/25/2017    is allergic to latex.  MEDICAL HISTORY: Past Medical History:  Diagnosis Date  . Abnormal uterine bleeding 2022  . Anemia 02/12/2021   Patient is taking iron supplementation.  . Cancer (HCC)    breast  . COVID-19 2020   cold-like symptoms  . Endometriosis   . Fibroids   . Graves disease   . Hypertension   . Hyperthyroidism 2022   s/p Nuclear Medicine RAI therapy on 08/07/20  . Seasonal allergies    severe chronic allergies, runny nose, congestion, cough when she lays down due to drainage  . Vitamin D deficiency 2021  . Wears glasses     SURGICAL HISTORY: Past Surgical History:  Procedure Laterality Date  . BREAST BIOPSY Right 06/06/2022   [place] RT BREAST BX W LOC DEV 1ST LESION IMG BX SPEC US GUIDE 06/06/2022 [facility] MAMMOGRAPHY  . BREAST LUMPECTOMY WITH SENTINEL LYMPH NODE BIOPSY Right 07/05/2022   Procedure: RIGHT BREAST LUMPECTOMY WITH SENTINEL LYMPH NODE BX;  Surgeon: Almond Lint, MD;  Location: [facility];  Service: General;  Laterality: Right;  . CYSTOSCOPY  10/04/2021   Procedure: CYSTOSCOPY;  Surgeon: Jaymes Graff, MD;  Location: [facility];  Service: Gynecology;;  . LAPAROSCOPIC VAGINAL HYSTERECTOMY WITH SALPINGECTOMY N/A 10/04/2021   Procedure: LAPAROSCOPIC ASSISTED VAGINAL HYSTERECTOMY WITH SALPINGECTOMY;  Surgeon: Jaymes Graff, MD;  Location: [facility];   Service: Gynecology;  Laterality: N/A;  . PORTACATH PLACEMENT Left 07/05/2022   Procedure: INSERTION PORT-A-CATH;  Surgeon: Almond Lint, MD;  Location: [facility];  Service: General;  Laterality: Left;  . TUBAL LIGATION  09/2008    SOCIAL HISTORY: Social History   Socioeconomic History  . Marital status: Married    Spouse name: Not on file  . Number of children: Not on file  . Years of education: Not on file  . Highest education level: Not on file  Occupational History  . Not on file  Tobacco Use  . Smoking status: Never  . Smokeless tobacco: Never  Vaping Use  . Vaping Use: Never used  Substance and Sexual Activity  . Alcohol use: No  . Drug use: No  . Sexual activity: Yes    Partners: Male    Birth control/protection: Surgical    Comment: BTL   Other Topics Concern  . Not on file  Social History Narrative  . Not on file   Social Determinants of Health   Financial Resource Strain: Not on file  Food Insecurity: Not on file  Transportation Needs: Not on file  Physical Activity: Not on file  Stress: Not on file  Social Connections: Not on file  Intimate Partner Violence: Not on file    FAMILY HISTORY: Family History  Problem Relation Age of Onset  . Breast cancer Sister 76       triple negative; d. early 30s  . Cancer Paternal  Aunt        unknown type  . Thyroid cancer Half-Sister        mat half sister; dx 67s; unknown cell type    Review of Systems - Oncology    PHYSICAL EXAMINATION    There were no vitals filed for this visit.  Physical Exam  LABORATORY DATA:  CBC    Component Value Date/Time   WBC 4.7 10/25/2022 0918   WBC 9.5 10/05/2021 0046   RBC 3.41 (L) 10/25/2022 0918   HGB 9.1 (L) 10/25/2022 0918   HCT 29.2 (L) 10/25/2022 0918   PLT 211 10/25/2022 0918   MCV 85.6 10/25/2022 0918   MCH 26.7 10/25/2022 0918   MCHC 31.2 10/25/2022 0918   RDW 20.8 (H) 10/25/2022 0918   LYMPHSABS 2.3 10/25/2022 0918   MONOABS 0.2  10/25/2022 0918   EOSABS 0.1 10/25/2022 0918   BASOSABS 0.0 10/25/2022 0918    CMP     Component Value Date/Time   NA 138 10/25/2022 0918   K 3.6 10/25/2022 0918   CL 105 10/25/2022 0918   CO2 27 10/25/2022 0918   GLUCOSE 102 (H) 10/25/2022 0918   BUN 24 (H) 10/25/2022 0918   CREATININE 0.82 10/25/2022 0918   CREATININE 0.78 04/23/2021 1505   CALCIUM 9.0 10/25/2022 0918   PROT 6.3 (L) 10/25/2022 0918   ALBUMIN 3.5 10/25/2022 0918   AST 15 10/25/2022 0918   ALT 19 10/25/2022 0918   ALKPHOS 49 10/25/2022 0918   BILITOT 0.6 10/25/2022 0918   GFRNONAA >60 10/25/2022 0918   GFRNONAA 115 05/21/2020 1708   GFRAA 133 05/21/2020 1708       PENDING LABS:   RADIOGRAPHIC STUDIES:  No results found.   PATHOLOGY:     ASSESSMENT and THERAPY PLAN:   No problem-specific Assessment & Plan notes found for this encounter.   No orders of the defined types were placed in this encounter.   All questions were answered. The patient knows to call the clinic with any problems, questions or concerns. We can certainly see the patient much sooner if necessary. This note was electronically signed. Noreene Filbert, NP 12/27/2022

## 2022-12-28 ENCOUNTER — Telehealth: Payer: Self-pay | Admitting: *Deleted

## 2022-12-28 ENCOUNTER — Encounter: Payer: Self-pay | Admitting: Hematology and Oncology

## 2022-12-28 ENCOUNTER — Encounter: Payer: Self-pay | Admitting: Adult Health

## 2022-12-28 LAB — FERRITIN: Ferritin: 6 ng/mL — ABNORMAL LOW (ref 11–307)

## 2022-12-28 NOTE — Telephone Encounter (Signed)
Per Lindsey Causey,DNP, called pt with message below. Pt verbalized understanding 

## 2022-12-28 NOTE — Telephone Encounter (Signed)
-----   Message from [name], NP sent at 12/28/2022  8:36 AM EDT ----- Patient needs IV iron.  Please let her know I am going to order this weekly x 3 to start next week.  Thanks, LC ----- Message ----- From: Leory Plowman, [facility] [place] Sent: 12/27/2022   4:22 PM EDT To: [name], NP

## 2022-12-29 ENCOUNTER — Telehealth: Payer: Self-pay | Admitting: *Deleted

## 2022-12-29 ENCOUNTER — Encounter: Payer: Self-pay | Admitting: Hematology and Oncology

## 2022-12-29 NOTE — Assessment & Plan Note (Signed)
Dawn Bates is a 52 year old woman with stage IA triple positive right breast cancer diagnosed in 05/2022 s/p lumpectomy, adjuvant chemotherapy, continuing on maintenance Herceptin, adjuvant radiation therapy, and to begin antiestrogen therapy.   Stage IA ER+/PR+/HER2+ breast cancer: She continues on Herceptin alone with good tolerance.  She is due to begin antiestrogen therapy with tamoxifen today.  We discussed the risks and benefits in detail.  I sent in a prescription for tamoxifen 10 mg p.o. twice daily.  I instructed her to start at 10 mg a day and then increase to 2 tabs daily after 1 week. At risk for heart failure: Her most recent echocardiogram occurred on October 17, 2022 and was normal.  Her next echo is scheduled on January 13, 2023. History of iron deficiency: We will recheck her iron studies today and if indicated will administer IV iron moving forward.  RTC every 3 weeks for Herceptin.  She will see a provider every other treatment.

## 2022-12-29 NOTE — Telephone Encounter (Signed)
Ms Sharron wants to know if there is anything else she can do other than IV iron. Would like to discuss with Lillard Anes, NP

## 2022-12-30 ENCOUNTER — Other Ambulatory Visit: Payer: Self-pay | Admitting: Family Medicine

## 2022-12-30 NOTE — Progress Notes (Signed)
Dawn Bates presents today for follow-up after completing radiation to her right breast on 12-13-22.   Pain:pt c/o right knee pain, she has had this for about 3-4 weeks, she has not fallen or injured the knee and the knee appears normal, Rn will discuss this with Dr. Basilio Cairo Skin: reports some discoloration under her arm, Rn encouraged use of Vitamin E cream/lotion for two months Fatigue: remains but improving ROM: normal ROM Lymphedema: none to report MedOnc F/U: Lillard Anes 02-07-23 Other issues of note: Pt only major concern is her knee pain. No other concerns based on Radiation therapy she received.   Pt reports Yes No Comments  Tamoxifen [x]  []  10mg   Letrozole []  [x]    Anastrazole []  [x]    Mammogram [x]  Date:  []  Rn encouraged pt to keep yearly mammogram appts

## 2023-01-12 ENCOUNTER — Telehealth: Payer: Self-pay

## 2023-01-12 ENCOUNTER — Encounter: Payer: Self-pay | Admitting: Radiation Oncology

## 2023-01-12 ENCOUNTER — Ambulatory Visit
Admission: RE | Admit: 2023-01-12 | Discharge: 2023-01-12 | Disposition: A | Discharge status: Home / Self Care | Payer: BC Managed Care – PPO | Source: Ambulatory Visit | Attending: Radiation Oncology | Admitting: Radiation Oncology

## 2023-01-12 NOTE — Telephone Encounter (Signed)
Rn left message after failed attempt to reach her for telephone follow up . Rn left call back information on message for pt.

## 2023-01-12 NOTE — Telephone Encounter (Signed)
Pt called RN back and RN was able to perform telephone follow up with pt. He main concern was right knee pain, more details in notes. Note completed and routed to Dr. Basilio Cairo.

## 2023-01-12 NOTE — Telephone Encounter (Signed)
Rn left second message for pt after second failed attempt to reach pt for telephone folllow up.

## 2023-01-13 ENCOUNTER — Telehealth: Payer: Self-pay

## 2023-01-13 ENCOUNTER — Ambulatory Visit (HOSPITAL_COMMUNITY)
Admission: RE | Admit: 2023-01-13 | Discharge: 2023-01-13 | Disposition: A | Discharge status: Home / Self Care | Payer: BC Managed Care – PPO | Source: Ambulatory Visit | Attending: Hematology and Oncology | Admitting: Hematology and Oncology

## 2023-01-13 ENCOUNTER — Telehealth: Payer: Self-pay | Admitting: *Deleted

## 2023-01-13 DIAGNOSIS — Z0189 Encounter for other specified special examinations: Secondary | ICD-10-CM

## 2023-01-13 DIAGNOSIS — Z170 Estrogen receptor positive status [ER+]: Secondary | ICD-10-CM | POA: Diagnosis not present

## 2023-01-13 DIAGNOSIS — C50411 Malignant neoplasm of upper-outer quadrant of right female breast: Secondary | ICD-10-CM | POA: Insufficient documentation

## 2023-01-13 LAB — ECHOCARDIOGRAM COMPLETE
AR max vel: 2.67 cm2
AV Area VTI: 2.61 cm2
AV Area mean vel: 2.57 cm2
AV Mean grad: 5 mmHg
AV Peak grad: 9.3 mmHg
Ao pk vel: 1.53 m/s
Area-P 1/2: 2.68 cm2
Calc EF: 64.1 %
MV VTI: 3.43 cm2
S' Lateral: 2.9 cm
Single Plane A2C EF: 63.6 %
Single Plane A4C EF: 61.6 %

## 2023-01-13 NOTE — Telephone Encounter (Signed)
RN called pt back concerning her right knee pain concern with telephone call yesterday. Rn spoke with Dr. Basilio Cairo and also Rona Ravens RN, both felt this knee pain was not related to her current cancer treatment. Rn encouraged pt to reach out to her primary care provider for more follow up on the knee pain. She also mentioned to RN about time line for fatigue improving. Rn encouraged pt that this fatigue could take several months to improve (due to her receiving chemo and radiation). Rn encouraged pt to give herself time to recover from treatment. Rn encouraged pt to also speak to Lillia Abed about this fatigue concern if it continues when she sees her in August. Pt was grateful for the call back.

## 2023-01-13 NOTE — Telephone Encounter (Signed)
This RN spoke with pt per her discussion with radiation nurse- and having right knee pain.  Dawn Bates states the pain started approximately 2 weeks ago -" it was a pain in my knee and it was difficult to walk on it and then I also had pain in my right hand"  "It was like a bone pain that would come and go"  "I did some research and read where Claritin might help so I took it and it went away"  She denies any swelling of leg, no focal pain at this time nor any swelling.  She has had no discomfort in her calf.  Presently she is asymptomatic.  This note will be forwarded to provider for review per follow appt on 01/17/2023

## 2023-01-17 ENCOUNTER — Telehealth: Payer: Self-pay

## 2023-01-17 ENCOUNTER — Other Ambulatory Visit: Payer: Self-pay

## 2023-01-17 ENCOUNTER — Encounter: Payer: Self-pay | Admitting: *Deleted

## 2023-01-17 ENCOUNTER — Inpatient Hospital Stay: Payer: BC Managed Care – PPO

## 2023-01-17 ENCOUNTER — Other Ambulatory Visit: Payer: Self-pay | Admitting: Family Medicine

## 2023-01-17 VITALS — BP 141/84 | HR 58 | Temp 97.8°F | Resp 16 | Wt 143.8 lb

## 2023-01-17 DIAGNOSIS — C50411 Malignant neoplasm of upper-outer quadrant of right female breast: Secondary | ICD-10-CM

## 2023-01-17 DIAGNOSIS — D500 Iron deficiency anemia secondary to blood loss (chronic): Secondary | ICD-10-CM

## 2023-01-17 DIAGNOSIS — Z95828 Presence of other vascular implants and grafts: Secondary | ICD-10-CM

## 2023-01-17 DIAGNOSIS — Z5112 Encounter for antineoplastic immunotherapy: Secondary | ICD-10-CM | POA: Diagnosis not present

## 2023-01-17 MED ORDER — TRASTUZUMAB-ANNS CHEMO 150 MG IV SOLR
6.0000 mg/kg | Freq: Once | INTRAVENOUS | Status: AC
  Administered 2023-01-17: 420 mg via INTRAVENOUS
  Filled 2023-01-17: qty 20

## 2023-01-17 MED ORDER — ACETAMINOPHEN 325 MG PO TABS
650.0000 mg | ORAL_TABLET | Freq: Once | ORAL | Status: AC
  Administered 2023-01-17: 650 mg via ORAL
  Filled 2023-01-17: qty 2

## 2023-01-17 MED ORDER — SODIUM CHLORIDE 0.9% FLUSH
10.0000 mL | Freq: Once | INTRAVENOUS | Status: AC
  Administered 2023-01-17: 10 mL

## 2023-01-17 MED ORDER — DIPHENHYDRAMINE HCL 25 MG PO CAPS
25.0000 mg | ORAL_CAPSULE | Freq: Once | ORAL | Status: AC
  Administered 2023-01-17: 25 mg via ORAL
  Filled 2023-01-17: qty 1

## 2023-01-17 MED ORDER — HEPARIN SOD (PORK) LOCK FLUSH 100 UNIT/ML IV SOLN
500.0000 [IU] | Freq: Once | INTRAVENOUS | Status: AC | PRN
  Administered 2023-01-17: 500 [IU]

## 2023-01-17 MED ORDER — HEPARIN SOD (PORK) LOCK FLUSH 100 UNIT/ML IV SOLN: 500.0000 [IU] | Freq: Once | INTRAVENOUS | Status: DC

## 2023-01-17 MED ORDER — SODIUM CHLORIDE 0.9 % IV SOLN: Freq: Once | INTRAVENOUS | Status: AC

## 2023-01-17 MED ORDER — SODIUM CHLORIDE 0.9% FLUSH: 10.0000 mL | INTRAVENOUS | Status: DC | PRN

## 2023-01-17 MED ORDER — ALTEPLASE 2 MG IJ SOLR
2.0000 mg | Freq: Once | INTRAMUSCULAR | Status: AC
  Administered 2023-01-17: 2 mg
  Filled 2023-01-17: qty 2

## 2023-01-17 NOTE — Patient Instructions (Signed)
[facility]  Discharge Instructions: Thank you for choosing [facility] to provide your oncology and hematology care.   If you have a lab appointment with the Cancer Center, please go directly to the Cancer Center and check in at the registration area.   Wear comfortable clothing and clothing appropriate for easy access to any Portacath or PICC line.   We strive to give you quality time with your provider. You may need to reschedule your appointment if you arrive late (15 or more minutes).  Arriving late affects you and other patients whose appointments are after yours.  Also, if you miss three or more appointments without notifying the office, you may be dismissed from the clinic at the provider's discretion.      For prescription refill requests, have your pharmacy contact our office and allow 72 hours for refills to be completed.    Today you received the following chemotherapy and/or immunotherapy agents: Kanjinti.      To help prevent nausea and vomiting after your treatment, we encourage you to take your nausea medication as directed.  BELOW ARE SYMPTOMS THAT SHOULD BE REPORTED IMMEDIATELY: *FEVER GREATER THAN 100.4 F (38 C) OR HIGHER *CHILLS OR SWEATING *NAUSEA AND VOMITING THAT IS NOT CONTROLLED WITH YOUR NAUSEA MEDICATION *UNUSUAL SHORTNESS OF BREATH *UNUSUAL BRUISING OR BLEEDING *URINARY PROBLEMS (pain or burning when urinating, or frequent urination) *BOWEL PROBLEMS (unusual diarrhea, constipation, pain near the anus) TENDERNESS IN MOUTH AND THROAT WITH OR WITHOUT PRESENCE OF ULCERS (sore throat, sores in mouth, or a toothache) UNUSUAL RASH, SWELLING OR PAIN  UNUSUAL VAGINAL DISCHARGE OR ITCHING   Items with * indicate a potential emergency and should be followed up as soon as possible or go to the Emergency Department if any problems should occur.  Please show the CHEMOTHERAPY ALERT CARD or IMMUNOTHERAPY ALERT CARD at  check-in to the Emergency Department and triage nurse.  Should you have questions after your visit or need to cancel or reschedule your appointment, please contact [facility]  Dept: [phone]  and follow the prompts.  Office hours are 8:00 a.m. to 4:30 p.m. Monday - Friday. Please note that voicemails left after 4:00 p.m. may not be returned until the following business day.  We are closed weekends and major holidays. You have access to a nurse at all times for urgent questions. Please call the main number to the clinic Dept: [phone] and follow the prompts.   For any non-urgent questions, you may also contact your provider using MyChart. We now offer e-Visits for anyone 18 and older to request care online for non-urgent symptoms. For details visit [name].   Also download the MyChart app! Go to the app store, search "MyChart", open the app, select [facility], and log in with your MyChart username and password.   

## 2023-01-17 NOTE — Telephone Encounter (Signed)
Notified Patient of prior authorization approval for Lidocaine-Prilocaine 2.5% Cream. Medication is approved through 04/17/2023. No other needs or concerns voiced at this time.

## 2023-01-17 NOTE — Telephone Encounter (Signed)
Entered in Error

## 2023-02-03 ENCOUNTER — Other Ambulatory Visit: Payer: Self-pay | Admitting: Adult Health

## 2023-02-03 DIAGNOSIS — Z170 Estrogen receptor positive status [ER+]: Secondary | ICD-10-CM

## 2023-02-06 NOTE — Telephone Encounter (Signed)
Per last OV in June 2024, patient to start tamoxifen once radiation is complete and radiation has been completed since June. Lorayne Marek, RN

## 2023-02-07 ENCOUNTER — Inpatient Hospital Stay: Payer: BC Managed Care – PPO | Admitting: Adult Health

## 2023-02-07 ENCOUNTER — Inpatient Hospital Stay: Payer: BC Managed Care – PPO | Admitting: Dietician

## 2023-02-07 ENCOUNTER — Encounter: Payer: Self-pay | Admitting: Hematology and Oncology

## 2023-02-07 ENCOUNTER — Inpatient Hospital Stay: Payer: BC Managed Care – PPO | Attending: Hematology and Oncology

## 2023-02-07 VITALS — BP 127/82 | HR 69 | Resp 18

## 2023-02-07 VITALS — BP 125/79 | HR 83 | Temp 99.5°F | Resp 18 | Ht 64.17 in | Wt 142.6 lb

## 2023-02-07 DIAGNOSIS — D500 Iron deficiency anemia secondary to blood loss (chronic): Secondary | ICD-10-CM | POA: Diagnosis not present

## 2023-02-07 DIAGNOSIS — C50411 Malignant neoplasm of upper-outer quadrant of right female breast: Secondary | ICD-10-CM

## 2023-02-07 DIAGNOSIS — Z5112 Encounter for antineoplastic immunotherapy: Secondary | ICD-10-CM | POA: Diagnosis present

## 2023-02-07 DIAGNOSIS — Z95828 Presence of other vascular implants and grafts: Secondary | ICD-10-CM | POA: Diagnosis not present

## 2023-02-07 DIAGNOSIS — Z170 Estrogen receptor positive status [ER+]: Secondary | ICD-10-CM | POA: Diagnosis not present

## 2023-02-07 MED ORDER — SODIUM CHLORIDE 0.9 % IV SOLN: Freq: Once | INTRAVENOUS | Status: AC

## 2023-02-07 MED ORDER — ACETAMINOPHEN 325 MG PO TABS
650.0000 mg | ORAL_TABLET | Freq: Once | ORAL | Status: AC
  Administered 2023-02-07: 650 mg via ORAL
  Filled 2023-02-07: qty 2

## 2023-02-07 MED ORDER — HEPARIN SOD (PORK) LOCK FLUSH 100 UNIT/ML IV SOLN
500.0000 [IU] | Freq: Once | INTRAVENOUS | Status: AC | PRN
  Administered 2023-02-07: 500 [IU]

## 2023-02-07 MED ORDER — DIPHENHYDRAMINE HCL 25 MG PO CAPS
25.0000 mg | ORAL_CAPSULE | Freq: Once | ORAL | Status: AC
  Administered 2023-02-07: 25 mg via ORAL
  Filled 2023-02-07: qty 1

## 2023-02-07 MED ORDER — SODIUM CHLORIDE 0.9% FLUSH
10.0000 mL | INTRAVENOUS | Status: DC | PRN
  Administered 2023-02-07: 10 mL

## 2023-02-07 MED ORDER — TRASTUZUMAB-ANNS CHEMO 150 MG IV SOLR
6.0000 mg/kg | Freq: Once | INTRAVENOUS | Status: AC
  Administered 2023-02-07: 420 mg via INTRAVENOUS
  Filled 2023-02-07: qty 20

## 2023-02-07 NOTE — Progress Notes (Unsigned)
[facility] Cancer Follow up:    Bates, Dawn G, MD [address] [place] [address]   DIAGNOSIS: Cancer Staging  Malignant neoplasm of upper-outer quadrant of right breast in female, estrogen receptor positive (HCC) Staging form: Breast, AJCC 8th Edition - Clinical stage from 06/06/2022: Stage IA (cT1c, cN0, cM0, G3, ER+, PR+, HER2+) - Signed by [name], MD on 07/19/2022 Stage prefix: Initial diagnosis Histologic grading system: 3 grade system - Pathologic stage from 07/05/2022: Stage IA (pT1c, pN0, cM0, G3, ER+, PR+, HER2+) - Signed by [name], NP on 08/02/2022 Stage prefix: Initial diagnosis Histologic grading system: 3 grade system   SUMMARY OF ONCOLOGIC HISTORY: Oncology History  Malignant neoplasm of upper-outer quadrant of right breast in female, estrogen receptor positive (HCC)  05/26/2022 Mammogram   Diagnostic mammogram after patient noticed a palpable lump in the right breast showed suspicious mass at the palpable site of concern in the right breast at 10:00 measuring 1.2 cm. Ultrasound of the right breast showed no evidence of lymphadenopathy.   06/06/2022 Pathology Results   Right breast needle core biopsy at 10:00 showed overall grade 2 invasive ductal carcinoma.  Prognostic showed ER 100% positive strong staining PR 5% positive strong staining, Ki-67 of 50% HER2 3+ by [facility]   06/06/2022 Cancer Staging   Staging form: Breast, AJCC 8th Edition - Clinical stage from 06/06/2022: Stage IA (cT1c, cN0, cM0, G3, ER+, PR+, HER2+) - Signed by [name], MD on 07/19/2022 Stage prefix: Initial diagnosis Histologic grading system: 3 grade system   06/26/2022 Genetic Testing   Negative [facility] CancerNext Expanded Panel.  Report date is 06/26/2022.   The CancerNext-Expanded gene panel offered by [facility] and includes sequencing and rearrangement analysis for the following 77 genes: AIP, ALK, APC, ATM, AXIN2, BAP1, BARD1, BLM,  BMPR1A, BRCA1, BRCA2, BRIP1, CDC73, CDH1, CDK4, CDKN1B, CDKN2A, CHEK2, CTNNA1, DICER1, FANCC, FH, FLCN, GALNT12, KIF1B, LZTR1, MAX, MEN1, MET, MLH1, MSH2, MSH3, MSH6, MUTYH, NBN, NF1, NF2, NTHL1, PALB2, PHOX2B, PMS2, POT1, PRKAR1A, PTCH1, PTEN, RAD51C, RAD51D, RB1, RECQL, RET, SDHA, SDHAF2, SDHB, SDHC, SDHD, SMAD4, SMARCA4, SMARCB1, SMARCE1, STK11, SUFU, TMEM127, TP53, TSC1, TSC2, VHL and XRCC2 (sequencing and deletion/duplication); EGFR, EGLN1, HOXB13, KIT, MITF, PDGFRA, POLD1, and POLE (sequencing only); EPCAM and GREM1 (deletion/duplication only).    06/28/2022 Initial Diagnosis   Malignant neoplasm of upper-outer quadrant of right breast in female, estrogen receptor positive (HCC)   07/05/2022 Surgery   Right breast lumpectomy: 1.3 cm invasive poorly differentiated ductal carcinoma grade 3, margins negative, 2 sentinel lymph nodes negative   07/05/2022 Cancer Staging   Staging form: Breast, AJCC 8th Edition - Pathologic stage from 07/05/2022: Stage IA (pT1c, pN0, cM0, G3, ER+, PR+, HER2+) - Signed by [name], NP on 08/02/2022 Stage prefix: Initial diagnosis Histologic grading system: 3 grade system   08/02/2022 -  Chemotherapy   Patient is on Treatment Plan : BREAST Paclitaxel + Trastuzumab q7d / Trastuzumab q21d     11/16/2022 - 12/13/2022 Radiation Therapy   Plan Name: Breast_R Site: Breast, Right Technique: 3D Mode: Photon Dose Per Fraction: 2.67 Gy Prescribed Dose (Delivered / Prescribed): 40.05 Gy / 40.05 Gy Prescribed Fxs (Delivered / Prescribed): 15 / 15   Plan Name: Breast_R_Bst Site: Breast, Right Technique: 3D Mode: Photon Dose Per Fraction: 2 Gy Prescribed Dose (Delivered / Prescribed): 10 Gy / 10 Gy Prescribed Fxs (Delivered / Prescribed): 5 / 5   12/2022 -  Anti-estrogen oral therapy   Tamoxifen daily  CURRENT THERAPY: tamoxifen/Herceptin  INTERVAL HISTORY: Dawn Bates 52 y.o. female returns for f/u and evaluation prior to receiving  Herceptin. She continues on Tamoxifen daily as well with good tolerance.  She is due for colonoscopy and was waiting for a referral to Dr. Loreta Ave.   Her most recent echo 01/13/2023 LVEF 60-65%, normal GLS.    Patient Active Problem List   Diagnosis Date Noted  . Routine general medical examination at a health care facility 10/19/2022  . Chronic idiopathic constipation 09/30/2022  . Iron deficiency anemia 09/30/2022  . Port-A-Cath in place 08/02/2022  . Genetic testing 07/20/2022  . Malignant neoplasm of upper-outer quadrant of right breast in female, estrogen receptor positive (HCC) 06/28/2022  . Family history of breast cancer 06/21/2022  . Abnormal uterine and vaginal bleeding, unspecified 10/04/2021  . S/P laparoscopic assisted vaginal hysterectomy (LAVH) 10/04/2021  . Hypothyroidism 07/28/2021  . Menorrhagia 07/28/2021  . Bilateral lower extremity edema 04/23/2021  . Primary hypertension 02/12/2021  . Elevated LFTs 05/22/2020  . Thyroid nodule 10/10/2019  . Headache, unspecified headache type 07/16/2019  . Non-seasonal allergic rhinitis due to pollen 07/16/2019  . Vitamin D deficiency, unspecified 07/16/2019  . Hyperlipidemia 07/16/2019  . Anemia 08/25/2017    is allergic to latex.  MEDICAL HISTORY: Past Medical History:  Diagnosis Date  . Abnormal uterine bleeding 2022  . Anemia 02/12/2021   Patient is taking iron supplementation.  . Cancer (HCC)    breast  . COVID-19 2020   cold-like symptoms  . Endometriosis   . Fibroids   . Graves disease   . Hypertension   . Hyperthyroidism 2022   s/p Nuclear Medicine RAI therapy on 08/07/20  . Seasonal allergies    severe chronic allergies, runny nose, congestion, cough when she lays down due to drainage  . Vitamin D deficiency 2021  . Wears glasses     SURGICAL HISTORY: Past Surgical History:  Procedure Laterality Date  . BREAST BIOPSY Right 06/06/2022   [place] RT BREAST BX W LOC DEV 1ST LESION IMG BX SPEC US GUIDE  06/06/2022 [facility] MAMMOGRAPHY  . BREAST LUMPECTOMY WITH SENTINEL LYMPH NODE BIOPSY Right 07/05/2022   Procedure: RIGHT BREAST LUMPECTOMY WITH SENTINEL LYMPH NODE BX;  Surgeon: Almond Lint, MD;  Location: [facility];  Service: General;  Laterality: Right;  . CYSTOSCOPY  10/04/2021   Procedure: CYSTOSCOPY;  Surgeon: Jaymes Graff, MD;  Location: [facility];  Service: Gynecology;;  . LAPAROSCOPIC VAGINAL HYSTERECTOMY WITH SALPINGECTOMY N/A 10/04/2021   Procedure: LAPAROSCOPIC ASSISTED VAGINAL HYSTERECTOMY WITH SALPINGECTOMY;  Surgeon: Jaymes Graff, MD;  Location: [facility];  Service: Gynecology;  Laterality: N/A;  . PORTACATH PLACEMENT Left 07/05/2022   Procedure: INSERTION PORT-A-CATH;  Surgeon: Almond Lint, MD;  Location: [facility];  Service: General;  Laterality: Left;  . TUBAL LIGATION  09/2008    SOCIAL HISTORY: Social History   Socioeconomic History  . Marital status: Married    Spouse name: Not on file  . Number of children: Not on file  . Years of education: Not on file  . Highest education level: Not on file  Occupational History  . Not on file  Tobacco Use  . Smoking status: Never  . Smokeless tobacco: Never  Vaping Use  . Vaping status: Never Used  Substance and Sexual Activity  . Alcohol use: No  . Drug use: No  . Sexual activity: Yes    Partners: Male    Birth control/protection: Surgical  Comment: BTL   Other Topics Concern  . Not on file  Social History Narrative  . Not on file   Social Determinants of Health   Financial Resource Strain: Not on file  Food Insecurity: No Food Insecurity (01/12/2023)   Hunger Vital Sign   . Worried About Programme researcher, broadcasting/film/video in the Last Year: Never true   . Ran Out of Food in the Last Year: Never true  Transportation Needs: No Transportation Needs (01/12/2023)   PRAPARE - Transportation   . Lack of Transportation (Medical): No   . Lack of Transportation  (Non-Medical): No  Physical Activity: Not on file  Stress: Not on file  Social Connections: Not on file  Intimate Partner Violence: Not At Risk (01/12/2023)   Humiliation, Afraid, Rape, and Kick questionnaire   . Fear of Current or Ex-Partner: No   . Emotionally Abused: No   . Physically Abused: No   . Sexually Abused: No    FAMILY HISTORY: Family History  Problem Relation Age of Onset  . Breast cancer Sister 98       triple negative; d. early 30s  . Cancer Paternal Aunt        unknown type  . Thyroid cancer Half-Sister        mat half sister; dx 69s; unknown cell type    Review of Systems  Constitutional:  Negative for appetite change, chills, fatigue, fever and unexpected weight change.  HENT:   Negative for hearing loss, lump/mass and trouble swallowing.   Eyes:  Negative for eye problems and icterus.  Respiratory:  Negative for chest tightness, cough and shortness of breath.   Cardiovascular:  Negative for chest pain, leg swelling and palpitations.  Gastrointestinal:  Negative for abdominal distention, abdominal pain, constipation, diarrhea, nausea and vomiting.  Endocrine: Negative for hot flashes.  Genitourinary:  Negative for difficulty urinating.   Musculoskeletal:  Negative for arthralgias.  Skin:  Negative for itching and rash.  Neurological:  Negative for dizziness, extremity weakness, headaches and numbness.  Hematological:  Negative for adenopathy. Does not bruise/bleed easily.  Psychiatric/Behavioral:  Negative for depression. The patient is not nervous/anxious.       PHYSICAL EXAMINATION   Onc Performance Status - 02/07/23 1244       ECOG Perf Status   ECOG Perf Status Restricted in physically strenuous activity but ambulatory and able to carry out work of a light or sedentary nature, e.g., light house work, office work      KPS SCALE   KPS % SCORE Able to carry on normal activity, minor s/s of disease             Vitals:   02/07/23 1240  BP:  125/79  Pulse: 83  Resp: 18  Temp: 99.5 F (37.5 C)  SpO2: 99%    Physical Exam Constitutional:      General: She is not in acute distress.    Appearance: Normal appearance. She is not toxic-appearing.  HENT:     Head: Normocephalic and atraumatic.     Mouth/Throat:     Mouth: Mucous membranes are moist.     Pharynx: Oropharynx is clear. No oropharyngeal exudate or posterior oropharyngeal erythema.  Eyes:     General: No scleral icterus. Cardiovascular:     Rate and Rhythm: Normal rate and regular rhythm.     Pulses: Normal pulses.     Heart sounds: Normal heart sounds.  Pulmonary:     Effort: Pulmonary effort is normal.  Breath sounds: Normal breath sounds.  Abdominal:     General: Abdomen is flat. Bowel sounds are normal. There is no distension.     Palpations: Abdomen is soft.     Tenderness: There is no abdominal tenderness.  Musculoskeletal:        General: No swelling.     Cervical back: Neck supple.  Lymphadenopathy:     Cervical: No cervical adenopathy.  Skin:    General: Skin is warm and dry.     Findings: No rash.  Neurological:     General: No focal deficit present.     Mental Status: She is alert.  Psychiatric:        Mood and Affect: Mood normal.        Behavior: Behavior normal.    LABORATORY DATA:  CBC    Component Value Date/Time   WBC 4.5 12/27/2022 1606   WBC 9.5 10/05/2021 0046   RBC 3.85 (L) 12/27/2022 1607   RBC 3.94 12/27/2022 1606   HGB 9.9 (L) 12/27/2022 1606   HCT 33.2 (L) 12/27/2022 1606   PLT 188 12/27/2022 1606   MCV 84.3 12/27/2022 1606   MCH 25.1 (L) 12/27/2022 1606   MCHC 29.8 (L) 12/27/2022 1606   RDW 15.9 (H) 12/27/2022 1606   LYMPHSABS 1.4 12/27/2022 1606   MONOABS 0.3 12/27/2022 1606   EOSABS 0.2 12/27/2022 1606   BASOSABS 0.0 12/27/2022 1606    CMP     Component Value Date/Time   NA 140 12/27/2022 1606   K 3.7 12/27/2022 1606   CL 107 12/27/2022 1606   CO2 27 12/27/2022 1606   GLUCOSE 85 12/27/2022 1606    BUN 17 12/27/2022 1606   CREATININE 0.88 12/27/2022 1606   CREATININE 0.78 04/23/2021 1505   CALCIUM 9.7 12/27/2022 1606   PROT 7.2 12/27/2022 1606   ALBUMIN 3.8 12/27/2022 1606   AST 18 12/27/2022 1606   ALT 15 12/27/2022 1606   ALKPHOS 72 12/27/2022 1606   BILITOT 0.4 12/27/2022 1606   GFRNONAA >60 12/27/2022 1606   GFRNONAA 115 05/21/2020 1708   GFRAA 133 05/21/2020 1708       PENDING LABS:   RADIOGRAPHIC STUDIES:  No results found.   PATHOLOGY:     ASSESSMENT and THERAPY PLAN:   No problem-specific Assessment & Plan notes found for this encounter.   No orders of the defined types were placed in this encounter.   All questions were answered. The patient knows to call the clinic with any problems, questions or concerns. We can certainly see the patient much sooner if necessary. This note was electronically signed. Noreene Filbert, NP 02/07/2023

## 2023-02-07 NOTE — Patient Instructions (Signed)

## 2023-02-08 ENCOUNTER — Telehealth: Payer: Self-pay | Admitting: Adult Health

## 2023-02-08 NOTE — Telephone Encounter (Signed)
Scheduled appointments per WQ. Patient is aware of all made appointments. 

## 2023-02-09 ENCOUNTER — Other Ambulatory Visit: Payer: Self-pay

## 2023-02-12 ENCOUNTER — Encounter: Payer: Self-pay | Admitting: Adult Health

## 2023-02-12 ENCOUNTER — Encounter: Payer: Self-pay | Admitting: Hematology and Oncology

## 2023-02-12 NOTE — Assessment & Plan Note (Signed)
Bentleigh is a 52 year old woman with stage IA triple positive right breast cancer diagnosed in 05/2022 s/p lumpectomy, adjuvant chemotherapy, continuing on maintenance Herceptin, adjuvant radiation therapy, and to begin antiestrogen therapy.   Stage IA ER+/PR+/HER2+ breast cancer: She continues on Herceptin alone with good tolerance.  Tamoxifen daily with good tolerance. At risk for heart failure: Her most recent echocardiogram occurred on October 17, 2022 and was normal.  Her next echo is scheduled on January 13, 2023. History of iron deficiency: Iron studies are low, she has declined IV iron and will pick up oral iron to take.  I placed a referral to Dr. Loreta Ave to evaluate iron deficiency and as she is due for her colonoscopy.   RTC every 3 weeks for Herceptin.  She will see a provider every other treatment.

## 2023-02-28 ENCOUNTER — Encounter: Payer: Self-pay | Admitting: *Deleted

## 2023-02-28 ENCOUNTER — Inpatient Hospital Stay: Payer: BC Managed Care – PPO | Attending: Hematology and Oncology

## 2023-02-28 ENCOUNTER — Inpatient Hospital Stay: Payer: BC Managed Care – PPO | Admitting: Dietician

## 2023-02-28 VITALS — BP 128/78 | HR 72 | Temp 98.2°F | Resp 18 | Wt 142.4 lb

## 2023-02-28 DIAGNOSIS — Z170 Estrogen receptor positive status [ER+]: Secondary | ICD-10-CM | POA: Diagnosis not present

## 2023-02-28 DIAGNOSIS — Z5112 Encounter for antineoplastic immunotherapy: Secondary | ICD-10-CM | POA: Insufficient documentation

## 2023-02-28 DIAGNOSIS — C50411 Malignant neoplasm of upper-outer quadrant of right female breast: Secondary | ICD-10-CM | POA: Diagnosis present

## 2023-02-28 DIAGNOSIS — Z95828 Presence of other vascular implants and grafts: Secondary | ICD-10-CM

## 2023-02-28 MED ORDER — SODIUM CHLORIDE 0.9 % IV SOLN: Freq: Once | INTRAVENOUS | Status: AC

## 2023-02-28 MED ORDER — ACETAMINOPHEN 325 MG PO TABS
650.0000 mg | ORAL_TABLET | Freq: Once | ORAL | Status: AC
  Administered 2023-02-28: 650 mg via ORAL
  Filled 2023-02-28: qty 2

## 2023-02-28 MED ORDER — TRASTUZUMAB-ANNS CHEMO 150 MG IV SOLR
6.0000 mg/kg | Freq: Once | INTRAVENOUS | Status: AC
  Administered 2023-02-28: 420 mg via INTRAVENOUS
  Filled 2023-02-28: qty 20

## 2023-02-28 MED ORDER — DIPHENHYDRAMINE HCL 25 MG PO CAPS
25.0000 mg | ORAL_CAPSULE | Freq: Once | ORAL | Status: AC
  Administered 2023-02-28: 25 mg via ORAL
  Filled 2023-02-28: qty 1

## 2023-02-28 NOTE — Progress Notes (Signed)
Nutrition Follow-up:  Patient with stage IA breast cancer of right breast, estrogen receptor positive. She is currently receiving kanjinti q21d.  Met with pt in infusion. She reports doing well overall. Pt eating small portions several times daily. Pt states she has not been eating much meat recently. Pt is drinking 64-80 ounces of water. She reports mild constipation with po iron. Bowels are moving 3 times/week. Patient denies nausea, vomiting.   Medications: reviewed   Labs: no new labs for review  Anthropometrics: Wt 142 lb 6 oz today  8/20 - 142 lb 9.6 oz 7/30 - 143 lb 12 oz 7/9 - 146 lb 9.6 oz   NUTRITION DIAGNOSIS: Food and nutrition related knowledge continues    INTERVENTION:  Continue eating small frequent meals and snacks Educated on foods with protein, encouraged protein source at every meal Suggested taking po iron with glass of orange juice  Anemia handout provided     MONITORING, EVALUATION, GOAL: wt trends, intake   NEXT VISIT: Tuesday October 22 during infusion

## 2023-02-28 NOTE — Patient Instructions (Signed)

## 2023-03-08 ENCOUNTER — Other Ambulatory Visit: Payer: Self-pay | Admitting: Hematology and Oncology

## 2023-03-08 ENCOUNTER — Telehealth: Payer: Self-pay

## 2023-03-08 DIAGNOSIS — C50411 Malignant neoplasm of upper-outer quadrant of right female breast: Secondary | ICD-10-CM

## 2023-03-08 NOTE — Telephone Encounter (Signed)
This RN called pt to confirm Tamoxifen dose. Pt confirmed that she is still taking 10 mg twice daily.

## 2023-03-22 ENCOUNTER — Encounter: Payer: Self-pay | Admitting: Hematology and Oncology

## 2023-03-22 ENCOUNTER — Inpatient Hospital Stay: Payer: BC Managed Care – PPO | Attending: Hematology and Oncology | Admitting: Hematology and Oncology

## 2023-03-22 ENCOUNTER — Inpatient Hospital Stay: Payer: BC Managed Care – PPO

## 2023-03-22 DIAGNOSIS — Z5112 Encounter for antineoplastic immunotherapy: Secondary | ICD-10-CM | POA: Diagnosis present

## 2023-03-22 DIAGNOSIS — Z170 Estrogen receptor positive status [ER+]: Secondary | ICD-10-CM

## 2023-03-22 DIAGNOSIS — C50411 Malignant neoplasm of upper-outer quadrant of right female breast: Secondary | ICD-10-CM

## 2023-03-22 DIAGNOSIS — Z95828 Presence of other vascular implants and grafts: Secondary | ICD-10-CM | POA: Diagnosis not present

## 2023-03-22 LAB — CBC WITH DIFFERENTIAL (CANCER CENTER ONLY)
Abs Immature Granulocytes: 0 10*3/uL (ref 0.00–0.07)
Basophils Absolute: 0 10*3/uL (ref 0.0–0.1)
Basophils Relative: 0 %
Eosinophils Absolute: 0.1 10*3/uL (ref 0.0–0.5)
Eosinophils Relative: 3 %
HCT: 35.7 % — ABNORMAL LOW (ref 36.0–46.0)
Hemoglobin: 11.2 g/dL — ABNORMAL LOW (ref 12.0–15.0)
Immature Granulocytes: 0 %
Lymphocytes Relative: 41 %
Lymphs Abs: 1.4 10*3/uL (ref 0.7–4.0)
MCH: 25.1 pg — ABNORMAL LOW (ref 26.0–34.0)
MCHC: 31.4 g/dL (ref 30.0–36.0)
MCV: 80 fL (ref 80.0–100.0)
Monocytes Absolute: 0.2 10*3/uL (ref 0.1–1.0)
Monocytes Relative: 6 %
Neutro Abs: 1.7 10*3/uL (ref 1.7–7.7)
Neutrophils Relative %: 50 %
Platelet Count: 142 10*3/uL — ABNORMAL LOW (ref 150–400)
RBC: 4.46 MIL/uL (ref 3.87–5.11)
RDW: 19 % — ABNORMAL HIGH (ref 11.5–15.5)
WBC Count: 3.4 10*3/uL — ABNORMAL LOW (ref 4.0–10.5)
nRBC: 0 % (ref 0.0–0.2)

## 2023-03-22 LAB — CMP (CANCER CENTER ONLY)
ALT: 12 U/L (ref 0–44)
AST: 16 U/L (ref 15–41)
Albumin: 4 g/dL (ref 3.5–5.0)
Alkaline Phosphatase: 54 U/L (ref 38–126)
Anion gap: 3 — ABNORMAL LOW (ref 5–15)
BUN: 14 mg/dL (ref 6–20)
CO2: 30 mmol/L (ref 22–32)
Calcium: 9.8 mg/dL (ref 8.9–10.3)
Chloride: 107 mmol/L (ref 98–111)
Creatinine: 0.92 mg/dL (ref 0.44–1.00)
GFR, Estimated: >60 mL/min (ref >60)
Glucose, Bld: 101 mg/dL — ABNORMAL HIGH (ref 70–99)
Potassium: 3.6 mmol/L (ref 3.5–5.1)
Sodium: 140 mmol/L (ref 135–145)
Total Bilirubin: 0.3 mg/dL (ref 0.3–1.2)
Total Protein: 7.1 g/dL (ref 6.5–8.1)

## 2023-03-22 LAB — IRON AND IRON BINDING CAPACITY (CC-WL,HP ONLY)
Iron: 114 ug/dL (ref 28–170)
Saturation Ratios: 30 % (ref 10.4–31.8)
TIBC: 382 ug/dL (ref 250–450)
UIBC: 268 ug/dL (ref 148–442)

## 2023-03-22 LAB — FERRITIN: Ferritin: 13 ng/mL (ref 11–307)

## 2023-03-22 MED ORDER — ACETAMINOPHEN 325 MG PO TABS
650.0000 mg | ORAL_TABLET | Freq: Once | ORAL | Status: AC
  Administered 2023-03-22: 650 mg via ORAL
  Filled 2023-03-22: qty 2

## 2023-03-22 MED ORDER — DIPHENHYDRAMINE HCL 25 MG PO CAPS
25.0000 mg | ORAL_CAPSULE | Freq: Once | ORAL | Status: AC
  Administered 2023-03-22: 25 mg via ORAL
  Filled 2023-03-22: qty 1

## 2023-03-22 MED ORDER — TRASTUZUMAB-ANNS CHEMO 150 MG IV SOLR
6.0000 mg/kg | Freq: Once | INTRAVENOUS | Status: AC
  Administered 2023-03-22: 420 mg via INTRAVENOUS
  Filled 2023-03-22: qty 20

## 2023-03-22 MED ORDER — SODIUM CHLORIDE 0.9 % IV SOLN: Freq: Once | INTRAVENOUS | Status: AC

## 2023-03-22 NOTE — Patient Instructions (Signed)

## 2023-03-22 NOTE — Progress Notes (Signed)
[facility] Cancer Follow up:    Dawn, [name], MD [address] [place] [address]   DIAGNOSIS:  Cancer Staging  Malignant neoplasm of upper-outer quadrant of right breast in female, estrogen receptor positive (HCC) Staging form: Breast, AJCC 8th Edition - Clinical stage from 06/06/2022: Stage IA (cT1c, cN0, cM0, G3, ER+, PR+, HER2+) - Signed by [name], MD on 07/19/2022 Stage prefix: Initial diagnosis Histologic grading system: 3 grade system - Pathologic stage from 07/05/2022: Stage IA (pT1c, pN0, cM0, G3, ER+, PR+, HER2+) - Signed by [name], NP on 08/02/2022 Stage prefix: Initial diagnosis Histologic grading system: 3 grade system   SUMMARY OF ONCOLOGIC HISTORY: Oncology History  Malignant neoplasm of upper-outer quadrant of right breast in female, estrogen receptor positive (HCC)  05/26/2022 Mammogram   Diagnostic mammogram after patient noticed a palpable lump in the right breast showed suspicious mass at the palpable site of concern in the right breast at 10:00 measuring 1.2 cm. Ultrasound of the right breast showed no evidence of lymphadenopathy.   06/06/2022 Pathology Results   Right breast needle core biopsy at 10:00 showed overall grade 2 invasive ductal carcinoma.  Prognostic showed ER 100% positive strong staining PR 5% positive strong staining, Ki-67 of 50% HER2 3+ by [facility]   06/06/2022 Cancer Staging   Staging form: Breast, AJCC 8th Edition - Clinical stage from 06/06/2022: Stage IA (cT1c, cN0, cM0, G3, ER+, PR+, HER2+) - Signed by [name], MD on 07/19/2022 Stage prefix: Initial diagnosis Histologic grading system: 3 grade system   06/26/2022 Genetic Testing   Negative [facility] CancerNext Expanded Panel.  Report date is 06/26/2022.   The CancerNext-Expanded gene panel offered by [facility] and includes sequencing and rearrangement analysis for the following 77 genes: AIP, ALK, APC, ATM, AXIN2, BAP1, BARD1, BLM,  BMPR1A, BRCA1, BRCA2, BRIP1, CDC73, CDH1, CDK4, CDKN1B, CDKN2A, CHEK2, CTNNA1, DICER1, FANCC, FH, FLCN, GALNT12, KIF1B, LZTR1, MAX, MEN1, MET, MLH1, MSH2, MSH3, MSH6, MUTYH, NBN, NF1, NF2, NTHL1, PALB2, PHOX2B, PMS2, POT1, PRKAR1A, PTCH1, PTEN, RAD51C, RAD51D, RB1, RECQL, RET, SDHA, SDHAF2, SDHB, SDHC, SDHD, SMAD4, SMARCA4, SMARCB1, SMARCE1, STK11, SUFU, TMEM127, TP53, TSC1, TSC2, VHL and XRCC2 (sequencing and deletion/duplication); EGFR, EGLN1, HOXB13, KIT, MITF, PDGFRA, POLD1, and POLE (sequencing only); EPCAM and GREM1 (deletion/duplication only).    06/28/2022 Initial Diagnosis   Malignant neoplasm of upper-outer quadrant of right breast in female, estrogen receptor positive (HCC)   07/05/2022 Surgery   Right breast lumpectomy: 1.3 cm invasive poorly differentiated ductal carcinoma grade 3, margins negative, 2 sentinel lymph nodes negative   07/05/2022 Cancer Staging   Staging form: Breast, AJCC 8th Edition - Pathologic stage from 07/05/2022: Stage IA (pT1c, pN0, cM0, G3, ER+, PR+, HER2+) - Signed by [name], NP on 08/02/2022 Stage prefix: Initial diagnosis Histologic grading system: 3 grade system   08/02/2022 -  Chemotherapy   Patient is on Treatment Plan : BREAST Paclitaxel + Trastuzumab q7d / Trastuzumab q21d     11/16/2022 - 12/13/2022 Radiation Therapy   Plan Name: Breast_R Site: Breast, Right Technique: 3D Mode: Photon Dose Per Fraction: 2.67 Gy Prescribed Dose (Delivered / Prescribed): 40.05 Gy / 40.05 Gy Prescribed Fxs (Delivered / Prescribed): 15 / 15   Plan Name: Breast_R_Bst Site: Breast, Right Technique: 3D Mode: Photon Dose Per Fraction: 2 Gy Prescribed Dose (Delivered / Prescribed): 10 Gy / 10 Gy Prescribed Fxs (Delivered / Prescribed): 5 / 5   12/2022 -  Anti-estrogen oral therapy   Tamoxifen daily  CURRENT THERAPY: tamoxifen/Herceptin  INTERVAL HISTORY:  Dawn Bates 52 y.o. female returns for f/u and evaluation prior to receiving  Herceptin.  The patient, with a history of breast cancer, presents with generalized itching that is triggered by lukewarm water. The itching is described as sudden and severe, affecting the entire body. The patient denies any history of dry skin or eczema. She has been managing the itching with Vaseline and a cream for their breasts. The patient also reports recent weight loss due to a bout of food poisoning, which has since resolved. She has been managing their symptoms with ginger ale and crackers. The patient also reports allergies, which are currently unmanaged as she was expecting to receive Benadryl at the clinic.  The patient is currently on Herceptin, which she receives every three weeks. She also reports taking oral iron every other day. The patient is scheduled for a colonoscopy, which she is considering moving to November to avoid any potential complications with their infusion. She is also taking tamoxifen, which she splits into two doses. The patient reports that one breast feels heavier than the other, which she suspects may be due to fluid retention. She admits to not doing their physical therapy exercises for this issue.  Her most recent echo 01/13/2023 LVEF 60-65%, normal GLS. She denies any issues today.    Patient Active Problem List   Diagnosis Date Noted   Routine general medical examination at a health care facility 10/19/2022   Chronic idiopathic constipation 09/30/2022   Iron deficiency anemia 09/30/2022   Port-A-Cath in place 08/02/2022   Genetic testing 07/20/2022   Malignant neoplasm of upper-outer quadrant of right breast in female, estrogen receptor positive (HCC) 06/28/2022   Family history of breast cancer 06/21/2022   Abnormal uterine and vaginal bleeding, unspecified 10/04/2021   S/P laparoscopic assisted vaginal hysterectomy (LAVH) 10/04/2021   Hypothyroidism 07/28/2021   Menorrhagia 07/28/2021   Bilateral lower extremity edema 04/23/2021   Primary hypertension  02/12/2021   Elevated LFTs 05/22/2020   Thyroid nodule 10/10/2019   Headache, unspecified headache type 07/16/2019   Non-seasonal allergic rhinitis due to pollen 07/16/2019   Vitamin D deficiency, unspecified 07/16/2019   Hyperlipidemia 07/16/2019   Anemia 08/25/2017    is allergic to latex.  MEDICAL HISTORY: Past Medical History:  Diagnosis Date   Abnormal uterine bleeding 2022   Anemia 02/12/2021   Patient is taking iron supplementation.   Cancer [facility])    breast   COVID-19 2020   cold-like symptoms   Endometriosis    Fibroids    Graves disease    Hypertension    Hyperthyroidism 2022   s/p Nuclear Medicine RAI therapy on 08/07/20   Seasonal allergies    severe chronic allergies, runny nose, congestion, cough when she lays down due to drainage   Vitamin D deficiency 2021   Wears glasses     SURGICAL HISTORY: Past Surgical History:  Procedure Laterality Date   BREAST BIOPSY Right 06/06/2022   [place] RT BREAST BX W LOC DEV 1ST LESION IMG BX SPEC US GUIDE 06/06/2022 [facility] MAMMOGRAPHY   BREAST LUMPECTOMY WITH SENTINEL LYMPH NODE BIOPSY Right 07/05/2022   Procedure: RIGHT BREAST LUMPECTOMY WITH SENTINEL LYMPH NODE BX;  Surgeon: Almond Lint, MD;  Location: [facility];  Service: General;  Laterality: Right;   CYSTOSCOPY  10/04/2021   Procedure: CYSTOSCOPY;  Surgeon: Jaymes Graff, MD;  Location: [facility];  Service: Gynecology;;   LAPAROSCOPIC VAGINAL HYSTERECTOMY WITH SALPINGECTOMY N/A 10/04/2021  Procedure: LAPAROSCOPIC ASSISTED VAGINAL HYSTERECTOMY WITH SALPINGECTOMY;  Surgeon: Jaymes Graff, MD;  Location: [facility];  Service: Gynecology;  Laterality: N/A;   PORTACATH PLACEMENT Left 07/05/2022   Procedure: INSERTION PORT-A-CATH;  Surgeon: Almond Lint, MD;  Location: [facility];  Service: General;  Laterality: Left;   TUBAL LIGATION  09/2008    SOCIAL HISTORY: Social History   Socioeconomic History    Marital status: Married    Spouse name: Not on file   Number of children: Not on file   Years of education: Not on file   Highest education level: Not on file  Occupational History   Not on file  Tobacco Use   Smoking status: Never   Smokeless tobacco: Never  Vaping Use   Vaping status: Never Used  Substance and Sexual Activity   Alcohol use: No   Drug use: No   Sexual activity: Yes    Partners: Male    Birth control/protection: Surgical    Comment: BTL   Other Topics Concern   Not on file  Social History Narrative   Not on file   Social Determinants of Health   Financial Resource Strain: Not on file  Food Insecurity: No Food Insecurity (01/12/2023)   Hunger Vital Sign    Worried About Running Out of Food in the Last Year: Never true    Ran Out of Food in the Last Year: Never true  Transportation Needs: No Transportation Needs (01/12/2023)   PRAPARE - Administrator, Civil Service (Medical): No    Lack of Transportation (Non-Medical): No  Physical Activity: Not on file  Stress: Not on file  Social Connections: Not on file  Intimate Partner Violence: Not At Risk (01/12/2023)   Humiliation, Afraid, Rape, and Kick questionnaire    Fear of Current or Ex-Partner: No    Emotionally Abused: No    Physically Abused: No    Sexually Abused: No    FAMILY HISTORY: Family History  Problem Relation Age of Onset   Breast cancer Sister 41       triple negative; d. early 42s   Cancer Paternal Aunt        unknown type   Thyroid cancer Half-Sister        mat half sister; dx 38s; unknown cell type    Review of Systems  Constitutional:  Negative for appetite change, chills, fatigue, fever and unexpected weight change.  HENT:   Negative for hearing loss, lump/mass and trouble swallowing.   Eyes:  Negative for eye problems and icterus.  Respiratory:  Negative for chest tightness, cough and shortness of breath.   Cardiovascular:  Negative for chest pain, leg swelling and  palpitations.  Gastrointestinal:  Negative for abdominal distention, abdominal pain, constipation, diarrhea, nausea and vomiting.  Endocrine: Negative for hot flashes.  Genitourinary:  Negative for difficulty urinating.   Musculoskeletal:  Negative for arthralgias.  Skin:  Negative for itching and rash.  Neurological:  Negative for dizziness, extremity weakness, headaches and numbness.  Hematological:  Negative for adenopathy. Does not bruise/bleed easily.  Psychiatric/Behavioral:  Negative for depression. The patient is not nervous/anxious.       PHYSICAL EXAMINATION     Vitals:   03/22/23 1224  BP: 134/83  Pulse: 67  Resp: 18  Temp: 97.9 F (36.6 C)  SpO2: 100%    Physical Exam Constitutional:      General: She is not in acute distress.    Appearance: Normal appearance. She is  not toxic-appearing.  HENT:     Head: Normocephalic and atraumatic.     Mouth/Throat:     Mouth: Mucous membranes are moist.     Pharynx: Oropharynx is clear. No oropharyngeal exudate or posterior oropharyngeal erythema.  Eyes:     General: No scleral icterus. Cardiovascular:     Rate and Rhythm: Normal rate and regular rhythm.     Pulses: Normal pulses.     Heart sounds: Normal heart sounds.  Pulmonary:     Effort: Pulmonary effort is normal.     Breath sounds: Normal breath sounds.  Abdominal:     General: Abdomen is flat. Bowel sounds are normal. There is no distension.     Palpations: Abdomen is soft.     Tenderness: There is no abdominal tenderness.  Musculoskeletal:        General: No swelling.     Cervical back: Neck supple.  Lymphadenopathy:     Cervical: No cervical adenopathy.  Skin:    General: Skin is warm and dry.     Findings: No rash.  Neurological:     General: No focal deficit present.     Mental Status: She is alert.  Psychiatric:        Mood and Affect: Mood normal.        Behavior: Behavior normal.     LABORATORY DATA:  CBC    Component Value Date/Time    WBC 4.5 12/27/2022 1606   WBC 9.5 10/05/2021 0046   RBC 3.85 (L) 12/27/2022 1607   RBC 3.94 12/27/2022 1606   HGB 9.9 (L) 12/27/2022 1606   HCT 33.2 (L) 12/27/2022 1606   PLT 188 12/27/2022 1606   MCV 84.3 12/27/2022 1606   MCH 25.1 (L) 12/27/2022 1606   MCHC 29.8 (L) 12/27/2022 1606   RDW 15.9 (H) 12/27/2022 1606   LYMPHSABS 1.4 12/27/2022 1606   MONOABS 0.3 12/27/2022 1606   EOSABS 0.2 12/27/2022 1606   BASOSABS 0.0 12/27/2022 1606    CMP     Component Value Date/Time   NA 140 12/27/2022 1606   K 3.7 12/27/2022 1606   CL 107 12/27/2022 1606   CO2 27 12/27/2022 1606   GLUCOSE 85 12/27/2022 1606   BUN 17 12/27/2022 1606   CREATININE 0.88 12/27/2022 1606   CREATININE 0.78 04/23/2021 1505   CALCIUM 9.7 12/27/2022 1606   PROT 7.2 12/27/2022 1606   ALBUMIN 3.8 12/27/2022 1606   AST 18 12/27/2022 1606   ALT 15 12/27/2022 1606   ALKPHOS 72 12/27/2022 1606   BILITOT 0.4 12/27/2022 1606   GFRNONAA >60 12/27/2022 1606   GFRNONAA 115 05/21/2020 1708   GFRAA 133 05/21/2020 1708      ASSESSMENT and THERAPY PLAN:   Malignant neoplasm of upper-outer quadrant of right breast in female, estrogen receptor positive (HCC) Allegra is a 52 year old woman with stage IA triple positive right breast cancer diagnosed in 05/2022 s/p lumpectomy, adjuvant chemotherapy, continuing on maintenance Herceptin, and Tamoxifen here for follow up.   Breast Cancer On Herceptin every three weeks. Reports new onset generalized itching, possibly triggered by lukewarm water. No prior history of this symptom before chemo or radiation. -Continue Herceptin as scheduled. -Order blood work to check iron levels and blood count.  Overactive Bladder Reports frequent urination with large volume, sometimes incontinence. -Continue current management.  Iron Deficiency Taking oral iron every other day. Last iron level check was a while ago. -Check iron levels with blood work.  Colonoscopy Scheduled for  April 10, 2023, but considering moving it to November due to proximity to infusion. -Reschedule colonoscopy as per patient's preference.  Tamoxifen Currently taking twice a day, but considering switching to once daily for convenience. -Switch Tamoxifen to once daily.  Breast Lymphedema Reports one breast feeling heavier than the other, possibly due to fluid retention. Not currently doing physical therapy exercises. -Resume physical therapy exercises. -Continue wearing sports bra during the day.  General Health Maintenance -Order mammogram for December 2024. -Order echocardiogram for April 15, 2023. -Consider participation in [place] study.   All questions were answered. The patient knows to call the clinic with any problems, questions or concerns. We can certainly see the patient much sooner if necessary.  Total encounter time:30 minutes*in face-to-face visit time, chart review, lab review, care coordination, order entry, and documentation of the encounter time.  Lillard Anes, NP 03/22/23 1:13 PM Medical Oncology and Hematology [facility] [address] [place], [place] [address] Tel. [phone]    Fax. [phone]  *Total Encounter Time as defined by the Centers for Medicare and Medicaid Services includes, in addition to the face-to-face time of a patient visit (documented in the note above) non-face-to-face time: obtaining and reviewing outside history, ordering and reviewing medications, tests or procedures, care coordination (communications with other health care professionals or caregivers) and documentation in the medical record.

## 2023-03-22 NOTE — Assessment & Plan Note (Signed)
Dawn Bates is a 52 year old woman with stage IA triple positive right breast cancer diagnosed in 05/2022 s/p lumpectomy, adjuvant chemotherapy, continuing on maintenance Herceptin, and Tamoxifen here for follow up.   Breast Cancer On Herceptin every three weeks. Reports new onset generalized itching, possibly triggered by lukewarm water. No prior history of this symptom before chemo or radiation. -Continue Herceptin as scheduled. -Order blood work to check iron levels and blood count.  Overactive Bladder Reports frequent urination with large volume, sometimes incontinence. -Continue current management.  Iron Deficiency Taking oral iron every other day. Last iron level check was a while ago. -Check iron levels with blood work.  Colonoscopy Scheduled for April 10, 2023, but considering moving it to November due to proximity to infusion. -Reschedule colonoscopy as per patient's preference.  Tamoxifen Currently taking twice a day, but considering switching to once daily for convenience. -Switch Tamoxifen to once daily.  Breast Lymphedema Reports one breast feeling heavier than the other, possibly due to fluid retention. Not currently doing physical therapy exercises. -Resume physical therapy exercises. -Continue wearing sports bra during the day.  General Health Maintenance -Order mammogram for December 2024. -Order echocardiogram for April 15, 2023. -Consider participation in [place] study.

## 2023-03-23 ENCOUNTER — Telehealth: Payer: Self-pay

## 2023-03-23 DIAGNOSIS — C50411 Malignant neoplasm of upper-outer quadrant of right female breast: Secondary | ICD-10-CM

## 2023-03-23 DIAGNOSIS — Z170 Estrogen receptor positive status [ER+]: Secondary | ICD-10-CM

## 2023-03-23 NOTE — Research (Signed)
OPTIMIZING PSYCHOSOCIAL INTERVENTION FOR BREAST CANCER-RELATED SEXUAL MORBIDITY: THE SEXUAL HEALTH AND INTIMACY EDUCATION [facility]) TRIAL  Per patient request, called to discuss study. No answer. Left VM with my contact information and work hours.  Margret Chance Savannah Morford, RN, BSN, [facility] She  Her  Hers Clinical Research Nurse [facility] Direct Dial [phone]  Pager [phone] 03/23/2023 2:11 PM

## 2023-03-23 NOTE — Research (Signed)
OPTIMIZING PSYCHOSOCIAL INTERVENTION FOR BREAST CANCER-RELATED SEXUAL MORBIDITY: THE SEXUAL HEALTH AND INTIMACY EDUCATION ([facility]) TRIAL  Rec'd message from [id number] that patient is eligible. I spoke with the patient, and she would like to come in on Wed 03/29/23 to sign consents. I have emailed her a copy to review in advance, and she will call me with any questions.  Margret Chance Tredarius Cobern, RN, BSN, [facility] She  Her  Hers Clinical Research Nurse [facility] Direct Dial [phone]  Pager [phone] 03/23/2023 3:21 PM

## 2023-03-23 NOTE — Telephone Encounter (Signed)
OPTIMIZING PSYCHOSOCIAL INTERVENTION FOR BREAST CANCER-RELATED SEXUAL MORBIDITY: THE SEXUAL HEALTH AND INTIMACY EDUCATION ([facility]) TRIAL  [name] called back. Explained study design; patient is interested in pursuing enrollment. Confirmed email address and submitted it to [employer] so she can complete her screening questionnaire. Instructed her that if she has not rec'd the link for the questionnaire by Monday 03/27/23 to please call and let me know.  [name], RN, BSN, [facility] She  Her  Hers Clinical Research Nurse [facility] Direct Dial [phone]  Pager [phone] 03/23/2023 2:31 PM

## 2023-03-29 ENCOUNTER — Telehealth: Payer: Self-pay

## 2023-03-29 ENCOUNTER — Inpatient Hospital Stay: Payer: BC Managed Care – PPO

## 2023-03-29 NOTE — Telephone Encounter (Signed)
OPTIMIZING PSYCHOSOCIAL INTERVENTION FOR BREAST CANCER-RELATED SEXUAL MORBIDITY: THE SEXUAL HEALTH AND INTIMACY EDUCATION ([facility]) TRIAL  Called patient to see if she is still coming for today's appt. She is unable to get away from work and will call me back to reschedule.  Margret Chance Emori Mumme, RN, BSN, [facility] She  Her  Hers Clinical Research Nurse [facility] Direct Dial [phone]  Pager [phone] 03/29/2023 1:22 PM

## 2023-03-31 ENCOUNTER — Telehealth: Payer: Self-pay

## 2023-03-31 NOTE — Telephone Encounter (Signed)
OPTIMIZING PSYCHOSOCIAL INTERVENTION FOR BREAST CANCER-RELATED SEXUAL MORBIDITY: THE SEXUAL HEALTH AND INTIMACY EDUCATION ([facility]) TRIAL   Rec'd call back from patient. She will come in Monday 04/03/23 to sign consents at 1300.  Margret Chance Mayo Owczarzak, RN, BSN, [facility] She  Her  Hers Clinical Research Nurse [facility] Direct Dial [phone]  Pager [phone] 03/31/2023 10:03 AM

## 2023-03-31 NOTE — Telephone Encounter (Signed)
OPTIMIZING PSYCHOSOCIAL INTERVENTION FOR BREAST CANCER-RELATED SEXUAL MORBIDITY: THE SEXUAL HEALTH AND INTIMACY EDUCATION ([facility]) TRIAL  Called patient to let her know that there are only a few more slots open & to encourage her to come in asap if she still wants to participate. No answer, left VM with my contact information.  Margret Chance Aldon Hengst, RN, BSN, [facility] She  Her  Hers Clinical Research Nurse [facility] Direct Dial [phone]  Pager [phone] 03/31/2023 9:28 AM

## 2023-04-03 ENCOUNTER — Encounter: Payer: Self-pay | Admitting: Hematology and Oncology

## 2023-04-03 ENCOUNTER — Inpatient Hospital Stay: Payer: BC Managed Care – PPO

## 2023-04-03 DIAGNOSIS — C50411 Malignant neoplasm of upper-outer quadrant of right female breast: Secondary | ICD-10-CM

## 2023-04-03 NOTE — Research (Signed)
Trial Name:  OPTIMIZING PSYCHOSOCIAL INTERVENTION FOR BREAST CANCER-RELATED SEXUAL MORBIDITY: THE SEXUAL HEALTH AND INTIMACY EDUCATION [facility]) TRIAL  Patient Dawn Bates was identified by Dr Al Pimple as a potential candidate for the above listed study.  This Clinical Research Nurse met with TANISA LAGACE, [id number] on 04/03/23 in a manner and location that ensures patient privacy to discuss participation in the above listed research study.  Patient is Unaccompanied.  Patient was previously provided with informed consent documents.  Patient has not yet read the informed consent documents and so documents were reviewed page by page today.  As outlined in the informed consent form, this Nurse and Ozell A Einspahr discussed the purpose of the research study, the investigational nature of the study, study procedures and requirements for study participation, potential risks and benefits of study participation, as well as alternatives to participation.  This study is not blinded or double-blinded. The patient understands participation is voluntary and they may withdraw from study participation at any time.  Each study arm was reviewed, and randomization discussed.  This study does not involve an investigational drug or device. This study does not involve a placebo. Patient understands enrollment is pending full eligibility review.   Confidentiality and how the patient's information will be used as part of study participation were discussed.  Patient was informed there is reimbursement provided for their time and effort spent on trial participation.  The patient is encouraged to discuss research study participation with their insurance provider to determine what costs they may incur as part of study participation, including research related injury.    All questions were answered to patient's satisfaction.  The informed consent and separate HIPAA Authorization was reviewed page by page.  The  patient's mental and emotional status is appropriate to provide informed consent, and the patient verbalizes an understanding of study participation.  Patient has agreed to participate in the above listed research study and has voluntarily signed the informed consent version 12/19/2022 and separate HIPAA Authorization, version 11/14/2022  on 04/03/23 at 1318PM.  The patient was provided with a copy of the signed informed consent form and separate HIPAA Authorization for their reference.  No study specific procedures were obtained prior to the signing of the informed consent document.  Approximately 25 minutes were spent with the patient reviewing the informed consent documents.  Patient was not requested to complete a Release of Information form.  Margret Chance Deundra Furber, RN, BSN, [facility] She  Her  Hers Clinical Research Nurse [facility] Direct Dial [phone]  Pager [phone] 04/03/2023 1:35 PM

## 2023-04-03 NOTE — Research (Addendum)
OPTIMIZING PSYCHOSOCIAL INTERVENTION FOR BREAST CANCER-RELATED SEXUAL MORBIDITY: THE SEXUAL HEALTH AND INTIMACY EDUCATION [facility]) TRIAL  This Nurse has reviewed this patient's inclusion and exclusion criteria and confirmed Tasheba A Fiveash is eligible for study participation.  Patient will continue with enrollment.  Eligibility confirmed by treating investigator, who also agrees that patient should proceed with enrollment.  Margret Chance Abdulaziz Toman, RN, BSN, [facility] She  Her  Hers Clinical Research Nurse [facility] Direct Dial [phone]  Pager [phone] 04/03/2023 1:32 PM  ADDENDUM: To add patient reads, writes, and understands English.  Margret Chance Aarion Kittrell, RN, BSN, [facility] She  Her  Hers Clinical Research Nurse [facility] Direct Dial [phone]  Pager [phone] 04/03/2023 1:42 PM    This Nurse has reviewed this patient's inclusion and exclusion criteria as a second review and confirms Lucresia A Byrns is eligible for study participation.  Patient may continue with enrollment.   Rexene Edison, RN, BSN, [facility] Clinical Research Nurse Lead 04/03/2023 1:45 PM

## 2023-04-04 ENCOUNTER — Other Ambulatory Visit: Payer: Self-pay

## 2023-04-04 ENCOUNTER — Other Ambulatory Visit: Payer: Self-pay | Admitting: Hematology and Oncology

## 2023-04-04 DIAGNOSIS — Z170 Estrogen receptor positive status [ER+]: Secondary | ICD-10-CM

## 2023-04-07 ENCOUNTER — Ambulatory Visit: Payer: BC Managed Care – PPO | Admitting: Internal Medicine

## 2023-04-07 ENCOUNTER — Encounter: Payer: Self-pay | Admitting: Internal Medicine

## 2023-04-07 VITALS — BP 138/88 | HR 64 | Resp 20 | Ht 64.17 in | Wt 144.8 lb

## 2023-04-07 DIAGNOSIS — Z8639 Personal history of other endocrine, nutritional and metabolic disease: Secondary | ICD-10-CM

## 2023-04-07 DIAGNOSIS — R1310 Dysphagia, unspecified: Secondary | ICD-10-CM

## 2023-04-07 DIAGNOSIS — E890 Postprocedural hypothyroidism: Secondary | ICD-10-CM | POA: Diagnosis not present

## 2023-04-07 NOTE — Patient Instructions (Addendum)
Please continue Levothyroxine 50 mcg daily.  If we need to restart the thyroid hormone, take this every day, with water, at least 30 minutes before breakfast, separated by at least 4 hours from: - acid reflux medications - calcium - iron - multivitamins  Take the thyroid medication always first.  Please stop at the lab.  Please return in 1 year.

## 2023-04-07 NOTE — Progress Notes (Signed)
Patient ID: Dawn Bates, female   DOB: Feb 03, 1971, 52 y.o.   MRN: [id number]  HPI  Dawn Bates is a 52 y.o.-year-old female, returning for follow-up for post ablative hypothyroidism for Graves ds. and thyroid nodule.  She previously saw Dr. Everardo All, last visit with me 6 months ago.  Interim history: Before last visit, she was diagnosed with breast cancer (estrogen receptor positive). She had surgery 06/2022, ChTx, RxTx. BRCA negative. Sister was diagnosed with breast cancer at 16 and died from it at 50 y/o (triple negative). She otherwise feels well, but has allergies and has congestion at today's visit.  Reviewed and addended history: Pt. has been dx with hypothyroidism in 07/2020, after RAI treatment for Graves ds. >> on Levothyroxine 25 mcg daily. She previously stopped the medication by herself, but we restarted it.  At last visit, we increase levothyroxine to 50 mcg daily.  She takes this: - fasting, however, she takes it after blood pressure medications! - with water - separated by >30 min from b'fast  - + calcium citrate in the p.m. - +  iron in the pm - no PPIs, multivitamins  - on Biotin  I reviewed pt's thyroid tests: Lab Results  Component Value Date   TSH 3.97 09/30/2022   TSH 3.77 03/25/2022   TSH 1.85 07/28/2021   TSH 1.67 04/09/2021   TSH 0.01 (L) 02/05/2021   TSH 10.01 (H) 10/26/2020   TSH <0.01 Repeated and verified X2. (L) 06/24/2020   TSH <0.01 (L) 05/21/2020   TSH 1.27 07/31/2019   FREET4 1.2 09/30/2022   FREET4 1.1 03/25/2022   FREET4 1.0 07/28/2021   FREET4 1.0 04/09/2021   FREET4 1.5 02/05/2021   FREET4 0.8 10/26/2020   FREET4 5.22 (H) 06/24/2020   FREET4 5.7 (H) 05/21/2020   T3FREE 3.5 09/30/2022   Antithyroid antibodies: No results found for: "THGAB" No components found for: "TPOAB"  She previously had: - no weight gain - + fatigue - chronic, improved after RAI tx - + cold intolerance, then hot flushes - no depression or  anxiety - + constipation - + dry skin - + hair loss/thinning This symptoms mostly persist.  She had a very small thyroid nodule diagnosed in 2021.  On the latest ultrasound, this has decreased in size: Thyroid U/S (06/08/2020): Parenchymal Echotexture: Moderately heterogenous gland appears diffusely hyperemic (representative images 4, 7 and 21).  Isthmus: Normal in size measuring 0.7 cm in diameter  Right lobe: Normal in size measuring 4.4 x 1.8 x 1.5 cm  Left lobe: Normal in size measuring 4.4 x 1.7 x 1.6 cm Slight interval decreased conspicuity of previously visualized 0.6 cm solid nodule in the left inferior thyroid. No new discrete nodules.   IMPRESSION: Similar appearing diffuse thyroid parenchymal heterogeneity with interval decreased hyperemia from August 22, 2019 comparison. No new discrete nodules.  Uptake and scan (07/15/2020): Findings thyroid imaging.  4 hour I-123 uptake = 76.9% (normal 5-20%)  24 hour I-123 uptake = 51% (normal 10-30%)   IMPRESSION: Imaging findings, iodine uptake and TSH suppression all consistent with Graves disease.  RAI treatment (08/07/2020)  Barium swallow (06/07/2022): Initial barium swallows demonstrate normal pharyngeal motion with swallowing. No laryngeal penetration or aspiration. No upper esophageal webs, strictures or diverticuli.   Normal esophageal motility. Normal mucosal folds. No intrinsic or extrinsic lesions are identified. No hiatal hernia or GE reflux demonstrated.   The 13 mm barium pill passed into the stomach without difficulty.   IMPRESSION: 1. Normal esophageal motility. 2.  No hiatal hernia or GE reflux demonstrated. 3. No esophageal mass or stricture.  Pt denies feeling nodules in neck, hoarseness, odynophagia.  She previously had dysphagia -occasionally difficult to even swallow saliva.  She also had postnasal drip.  Symptoms resolved with antihistaminics.  She has + FH of thyroid disorders in - older sister:   thyroid cancer.  No h/o radiation tx to head or neck  - other than RAI tx. No use of iodine supplements.  Pt. also has a history of hysterectomy in 09/2021 for DUB.  ROS: + See HPI  Past Medical History:  Diagnosis Date   Abnormal uterine bleeding 2022   Anemia 02/12/2021   Patient is taking iron supplementation.   Cancer [facility])    breast   COVID-19 2020   cold-like symptoms   Endometriosis    Fibroids    Graves disease    Hypertension    Hyperthyroidism 2022   s/p Nuclear Medicine RAI therapy on 08/07/20   Seasonal allergies    severe chronic allergies, runny nose, congestion, cough when she lays down due to drainage   Vitamin D deficiency 2021   Wears glasses    Past Surgical History:  Procedure Laterality Date   BREAST BIOPSY Right 06/06/2022   [place] RT BREAST BX W LOC DEV 1ST LESION IMG BX SPEC US GUIDE 06/06/2022 [facility] MAMMOGRAPHY   BREAST LUMPECTOMY WITH SENTINEL LYMPH NODE BIOPSY Right 07/05/2022   Procedure: RIGHT BREAST LUMPECTOMY WITH SENTINEL LYMPH NODE BX;  Surgeon: Almond Lint, MD;  Location: [facility];  Service: General;  Laterality: Right;   CYSTOSCOPY  10/04/2021   Procedure: CYSTOSCOPY;  Surgeon: Jaymes Graff, MD;  Location: [facility];  Service: Gynecology;;   LAPAROSCOPIC VAGINAL HYSTERECTOMY WITH SALPINGECTOMY N/A 10/04/2021   Procedure: LAPAROSCOPIC ASSISTED VAGINAL HYSTERECTOMY WITH SALPINGECTOMY;  Surgeon: Jaymes Graff, MD;  Location: [facility];  Service: Gynecology;  Laterality: N/A;   PORTACATH PLACEMENT Left 07/05/2022   Procedure: INSERTION PORT-A-CATH;  Surgeon: Almond Lint, MD;  Location: [facility];  Service: General;  Laterality: Left;   TUBAL LIGATION  09/2008   Social History   Socioeconomic History   Marital status: Married    Spouse name: Not on file   Number of children: Not on file   Years of education: Not on file   Highest education level: Not on file   Occupational History   Not on file  Tobacco Use   Smoking status: Never   Smokeless tobacco: Never  Vaping Use   Vaping status: Never Used  Substance and Sexual Activity   Alcohol use: No   Drug use: No   Sexual activity: Yes    Partners: Male    Birth control/protection: Surgical    Comment: BTL   Other Topics Concern   Not on file  Social History Narrative   Not on file   Social Determinants of Health   Financial Resource Strain: Not on file  Food Insecurity: No Food Insecurity (01/12/2023)   Hunger Vital Sign    Worried About Running Out of Food in the Last Year: Never true    Ran Out of Food in the Last Year: Never true  Transportation Needs: No Transportation Needs (01/12/2023)   PRAPARE - Administrator, Civil Service (Medical): No    Lack of Transportation (Non-Medical): No  Physical Activity: Not on file  Stress: Not on file  Social Connections: Not on file  Intimate Partner Violence: Not At  Risk (01/12/2023)   Humiliation, Afraid, Rape, and Kick questionnaire    Fear of Current or Ex-Partner: No    Emotionally Abused: No    Physically Abused: No    Sexually Abused: No   Current Outpatient Medications on File Prior to Visit  Medication Sig Dispense Refill   acetaminophen (TYLENOL) 500 MG tablet Take 500 mg by mouth every 8 (eight) hours as needed for mild pain ([place]).     amLODipine (NORVASC) 5 MG tablet TAKE 1 TABLET (5 MG TOTAL) BY MOUTH DAILY. 90 tablet 3   Calcium-Phosphorus-Vitamin D (CITRACAL +D3 PO) Take by mouth.     ferrous sulfate 325 (65 FE) MG tablet Take 325 mg by mouth daily with breakfast.     levothyroxine (SYNTHROID) 50 MCG tablet Take 1 tablet (50 mcg total) by mouth daily. 45 tablet 3   lidocaine-prilocaine (EMLA) cream Apply to affected area once 30 g 3   tamoxifen (NOLVADEX) 10 MG tablet TAKE 1 TABLET BY MOUTH TWICE A DAY 60 tablet 0   valACYclovir (VALTREX) 500 MG tablet Take 500 mg by mouth as needed.     No current  facility-administered medications on file prior to visit.   Allergies  Allergen Reactions   Latex Itching   Family History  Problem Relation Age of Onset   Breast cancer Sister 37       triple negative; d. early 77s   Cancer Paternal Aunt        unknown type   Thyroid cancer Half-Sister        mat half sister; dx 61s; unknown cell type   PE: BP 138/88 (BP Location: Left Arm, Patient Position: Sitting, Cuff Size: Normal)   Pulse 64   Resp 20   Ht 5' 4.17" (1.63 m)   Wt 144 lb 12.8 oz (65.7 kg)   LMP 09/09/2021 (Exact Date)   SpO2 99%   BMI 24.72 kg/m  Wt Readings from Last 15 Encounters:  04/07/23 144 lb 12.8 oz (65.7 kg)  03/22/23 142 lb 8 oz (64.6 kg)  02/28/23 142 lb 6 oz (64.6 kg)  02/07/23 142 lb 9.6 oz (64.7 kg)  01/17/23 143 lb 12 oz (65.2 kg)  12/27/22 146 lb 9.6 oz (66.5 kg)  12/06/22 142 lb 3.2 oz (64.5 kg)  11/15/22 140 lb 12.8 oz (63.9 kg)  11/08/22 140 lb 2 oz (63.6 kg)  10/25/22 140 lb 8 oz (63.7 kg)  10/19/22 139 lb 6 oz (63.2 kg)  10/18/22 138 lb (62.6 kg)  10/11/22 139 lb 6.4 oz (63.2 kg)  10/04/22 140 lb (63.5 kg)  09/30/22 136 lb (61.7 kg)   Constitutional: normal weight, in NAD Eyes:  EOMI, no exophthalmos ENT: no neck masses, no cervical lymphadenopathy Cardiovascular: RRR, No MRG Respiratory: CTA B Musculoskeletal: no deformities Skin:no rashes Neurological: no tremor with outstretched hands  ASSESSMENT: 1. Postablative Hypothyroidism - h/o Graves ds.  2.  Thyroid nodule  PLAN:  1. Patient with longstanding hypothyroidism, on low-dose levothyroxine therapy - latest thyroid labs reviewed with pt. >> normal: Lab Results  Component Value Date   TSH 3.97 09/30/2022  - she continues on LT4 50 mcg daily - pt feels good on this dose.  She lost 11 pounds in the year prior to our last visit, but she gained 8 pounds since last visit. - we discussed about taking the thyroid hormone every day, with water, >30 minutes before breakfast,  separated by >4 hours from acid reflux medications, calcium, iron, multivitamins. Pt.  is not taking it correctly: Takes her antihypertensives first, and levothyroxine afterwards.  I advised her to change the order by taking levothyroxine first. - will check thyroid tests today: TSH and fT4 - If labs are abnormal, she will need to return for repeat TFTs in 1.5 months - I will see her back in 1 year  2.  Thyroid nodule -No masses felt on palpation of her neck today -She had a thyroid ultrasound in 05/2020.  This showed heterogeneity of the thyroid,, with decreased blood flow in the left thyroid nodule that was decreased in size, measuring <0.6 cm -In 2023 she described dysphagia, which I suspected was related to postnasal drip, but she strongly that this was related to the thyroid so we ended up obtaining an esophageal barium swallow in 05/2022.  This was entirely normal.  And was particularly held by antihistaminics that she received with chemotherapy, confirming an allergic etiology -We discussed that her thyroid nodule was very small, and unlikely to cause any problems swallowing.  The thyroid lobes were also not enlarged on the last ultrasound. -No further imaging investigation is needed for this.  Needs refills.  Orders Placed This Encounter  Procedures   TSH   T4, free   Patient did not stop at the lab... Will ask her to come back.  Carlus Pavlov, MD PhD [facility]

## 2023-04-08 ENCOUNTER — Other Ambulatory Visit: Payer: Self-pay

## 2023-04-11 ENCOUNTER — Inpatient Hospital Stay: Payer: BC Managed Care – PPO

## 2023-04-11 ENCOUNTER — Inpatient Hospital Stay: Payer: BC Managed Care – PPO | Admitting: Dietician

## 2023-04-11 VITALS — BP 150/91 | HR 58 | Temp 98.0°F | Resp 16 | Ht 66.0 in | Wt 144.5 lb

## 2023-04-11 DIAGNOSIS — Z95828 Presence of other vascular implants and grafts: Secondary | ICD-10-CM

## 2023-04-11 DIAGNOSIS — Z5112 Encounter for antineoplastic immunotherapy: Secondary | ICD-10-CM | POA: Diagnosis not present

## 2023-04-11 DIAGNOSIS — C50411 Malignant neoplasm of upper-outer quadrant of right female breast: Secondary | ICD-10-CM

## 2023-04-11 MED ORDER — DIPHENHYDRAMINE HCL 25 MG PO CAPS
25.0000 mg | ORAL_CAPSULE | Freq: Once | ORAL | Status: AC
  Administered 2023-04-11: 25 mg via ORAL
  Filled 2023-04-11: qty 1

## 2023-04-11 MED ORDER — TRASTUZUMAB-ANNS CHEMO 150 MG IV SOLR
6.0000 mg/kg | Freq: Once | INTRAVENOUS | Status: AC
  Administered 2023-04-11: 420 mg via INTRAVENOUS
  Filled 2023-04-11: qty 20

## 2023-04-11 MED ORDER — HEPARIN SOD (PORK) LOCK FLUSH 100 UNIT/ML IV SOLN
500.0000 [IU] | Freq: Once | INTRAVENOUS | Status: AC | PRN
  Administered 2023-04-11: 500 [IU]

## 2023-04-11 MED ORDER — SODIUM CHLORIDE 0.9 % IV SOLN: Freq: Once | INTRAVENOUS | Status: AC

## 2023-04-11 MED ORDER — SODIUM CHLORIDE 0.9% FLUSH
10.0000 mL | INTRAVENOUS | Status: DC | PRN
  Administered 2023-04-11: 10 mL

## 2023-04-11 MED ORDER — ACETAMINOPHEN 325 MG PO TABS
650.0000 mg | ORAL_TABLET | Freq: Once | ORAL | Status: AC
  Administered 2023-04-11: 650 mg via ORAL
  Filled 2023-04-11: qty 2

## 2023-04-11 NOTE — Patient Instructions (Signed)
[facility]  Discharge Instructions: Thank you for choosing [facility] to provide your oncology and hematology care.   If you have a lab appointment with the Cancer Center, please go directly to the Cancer Center and check in at the registration area.   Wear comfortable clothing and clothing appropriate for easy access to any Portacath or PICC line.   We strive to give you quality time with your provider. You may need to reschedule your appointment if you arrive late (15 or more minutes).  Arriving late affects you and other patients whose appointments are after yours.  Also, if you miss three or more appointments without notifying the office, you may be dismissed from the clinic at the provider's discretion.      For prescription refill requests, have your pharmacy contact our office and allow 72 hours for refills to be completed.    Today you received the following chemotherapy and/or immunotherapy agent: Trastuzumab (Kanjinti)   To help prevent nausea and vomiting after your treatment, we encourage you to take your nausea medication as directed.  BELOW ARE SYMPTOMS THAT SHOULD BE REPORTED IMMEDIATELY: *FEVER GREATER THAN 100.4 F (38 C) OR HIGHER *CHILLS OR SWEATING *NAUSEA AND VOMITING THAT IS NOT CONTROLLED WITH YOUR NAUSEA MEDICATION *UNUSUAL SHORTNESS OF BREATH *UNUSUAL BRUISING OR BLEEDING *URINARY PROBLEMS (pain or burning when urinating, or frequent urination) *BOWEL PROBLEMS (unusual diarrhea, constipation, pain near the anus) TENDERNESS IN MOUTH AND THROAT WITH OR WITHOUT PRESENCE OF ULCERS (sore throat, sores in mouth, or a toothache) UNUSUAL RASH, SWELLING OR PAIN  UNUSUAL VAGINAL DISCHARGE OR ITCHING   Items with * indicate a potential emergency and should be followed up as soon as possible or go to the Emergency Department if any problems should occur.  Please show the CHEMOTHERAPY ALERT CARD or IMMUNOTHERAPY ALERT CARD  at check-in to the Emergency Department and triage nurse.  Should you have questions after your visit or need to cancel or reschedule your appointment, please contact [facility] [facility]  Dept: [phone]  and follow the prompts.  Office hours are 8:00 a.m. to 4:30 p.m. Monday - Friday. Please note that voicemails left after 4:00 p.m. may not be returned until the following business day.  We are closed weekends and major holidays. You have access to a nurse at all times for urgent questions. Please call the main number to the clinic Dept: [phone] and follow the prompts.   For any non-urgent questions, you may also contact your provider using MyChart. We now offer e-Visits for anyone 59 and older to request care online for non-urgent symptoms. For details visit [email or web address].   Also download the MyChart app! Go to the app store, search "MyChart", open the app, select [place], and log in with your MyChart username and password. Trastuzumab Injection What is this medication? TRASTUZUMAB (tras TOO zoo mab) treats breast cancer and stomach cancer. It works by blocking a protein that causes cancer cells to grow and multiply. This helps to slow or stop the spread of cancer cells. This medicine may be used for other purposes; ask your health care provider or pharmacist if you have questions. COMMON BRAND NAME(S): Herceptin, Marlowe Alt, Ontruzant, Trazimera What should I tell my care team before I take this medication? They need to know if you have any of these conditions: Heart failure Lung disease An unusual or allergic reaction to trastuzumab, other medications, foods, dyes, or  preservatives Pregnant or trying to get pregnant Breast-feeding How should I use this medication? This medication is injected into a vein. It is given by your care team in a hospital or clinic setting. Talk to your care team about the use of this medication in  children. It is not approved for use in children. Overdosage: If you think you have taken too much of this medicine contact a poison control center or emergency room at once. NOTE: This medicine is only for you. Do not share this medicine with others. What if I miss a dose? Keep appointments for follow-up doses. It is important not to miss your dose. Call your care team if you are unable to keep an appointment. What may interact with this medication? Certain types of chemotherapy, such as daunorubicin, doxorubicin, epirubicin, idarubicin This list may not describe all possible interactions. Give your health care provider a list of all the medicines, herbs, non-prescription drugs, or dietary supplements you use. Also tell them if you smoke, drink alcohol, or use illegal drugs. Some items may interact with your medicine. What should I watch for while using this medication? Your condition will be monitored carefully while you are receiving this medication. This medication may make you feel generally unwell. This is not uncommon, as chemotherapy affects healthy cells as well as cancer cells. Report any side effects. Continue your course of treatment even though you feel ill unless your care team tells you to stop. This medication may increase your risk of getting an infection. Call your care team for advice if you get a fever, chills, sore throat, or other symptoms of a cold or flu. Do not treat yourself. Try to avoid being around people who are sick. Avoid taking medications that contain aspirin, acetaminophen, ibuprofen, naproxen, or ketoprofen unless instructed by your care team. These medications can hide a fever. Talk to your care team if you may be pregnant. Serious birth defects can occur if you take this medication during pregnancy and for 7 months after the last dose. You will need a negative pregnancy test before starting this medication. Contraception is recommended while taking this medication  and for 7 months after the last dose. Your care team can help you find the option that works for you. Do not breastfeed while taking this medication and for 7 months after stopping treatment. What side effects may I notice from receiving this medication? Side effects that you should report to your care team as soon as possible: Allergic reactions or angioedema--skin rash, itching or hives, swelling of the face, eyes, lips, tongue, arms, or legs, trouble swallowing or breathing Dry cough, shortness of breath or trouble breathing Heart failure--shortness of breath, swelling of the ankles, feet, or hands, sudden weight gain, unusual weakness or fatigue Infection--fever, chills, cough, or sore throat Infusion reactions--chest pain, shortness of breath or trouble breathing, feeling faint or lightheaded Side effects that usually do not require medical attention (report to your care team if they continue or are bothersome): Diarrhea Dizziness Headache Nausea Trouble sleeping Vomiting This list may not describe all possible side effects. Call your doctor for medical advice about side effects. You may report side effects to FDA at 1-800-FDA-1088. Where should I keep my medication? This medication is given in a hospital or clinic. It will not be stored at home. NOTE: This sheet is a summary. It may not cover all possible information. If you have questions about this medicine, talk to your doctor, pharmacist, or health care provider.  2024  Elsevier/Gold Standard (2021-10-19 00:00:00)

## 2023-04-11 NOTE — Progress Notes (Signed)
Nutrition Follow-up:  Patient with stage IA breast cancer of right breast, estrogen receptor positive. She is currently receiving kanjinti q21d.   Met with patient in infusion. Patient reports tolerating therapy well. She is eating 3 times daily. Typically has fruits for breakfast. Recalls spaghetti with green beans for lunch, steamed shrimp, lima beans, and left over green beans for dinner. Patient is drinking 1 L of alkaline water. She is asking if there is benefit to drinking this. Patient denies nutrition impact symptoms.    Medications: reviewed   Labs: no new labs   Anthropometrics: Wt 144 lb 8 oz today - stable   10/18 - 144 lb 12.8 oz  9/10 - 142 lb 6 oz   NUTRITION DIAGNOSIS: Food and nutrition related knowledge deficit improved    INTERVENTION:  Continue including good sources of protein at every meal    MONITORING, EVALUATION, GOAL: wt trends, intake    NEXT VISIT: To be scheduled as needed with treatment

## 2023-04-13 ENCOUNTER — Ambulatory Visit (HOSPITAL_COMMUNITY)
Admission: RE | Admit: 2023-04-13 | Discharge: 2023-04-13 | Disposition: A | Discharge status: Home / Self Care | Payer: BC Managed Care – PPO | Source: Ambulatory Visit | Attending: Hematology and Oncology | Admitting: Hematology and Oncology

## 2023-04-13 DIAGNOSIS — Z170 Estrogen receptor positive status [ER+]: Secondary | ICD-10-CM | POA: Diagnosis present

## 2023-04-13 DIAGNOSIS — C50411 Malignant neoplasm of upper-outer quadrant of right female breast: Secondary | ICD-10-CM | POA: Insufficient documentation

## 2023-04-13 DIAGNOSIS — Z9221 Personal history of antineoplastic chemotherapy: Secondary | ICD-10-CM | POA: Diagnosis not present

## 2023-04-13 DIAGNOSIS — Z0189 Encounter for other specified special examinations: Secondary | ICD-10-CM

## 2023-04-13 DIAGNOSIS — I10 Essential (primary) hypertension: Secondary | ICD-10-CM | POA: Diagnosis not present

## 2023-04-13 LAB — ECHOCARDIOGRAM COMPLETE
Area-P 1/2: 2.72 cm2
Calc EF: 54.8 %
S' Lateral: 3.3 cm
Single Plane A2C EF: 53 %
Single Plane A4C EF: 57.2 %

## 2023-04-14 MED ORDER — LEVOTHYROXINE SODIUM 50 MCG PO TABS: 50.0000 ug | ORAL_TABLET | Freq: Every day | ORAL | 1 refills | Status: DC

## 2023-04-17 ENCOUNTER — Telehealth: Payer: Self-pay

## 2023-04-17 NOTE — Telephone Encounter (Signed)
I called and spoke with the patient and she will be coming in on Friday. She states that the lab tech was gone for the day.

## 2023-04-17 NOTE — Telephone Encounter (Signed)
Received this message from Dr. Elvera Lennox: This patient was here in the clinic few days ago but did not stop at the lab for thyroid tests.  We did not change how she takes the levothyroxine so for now she can continue taking it first thing in the morning before her antihypertensive medications and come back for labs in about 5 weeks.  Can we please schedule this? I did refill her levothyroxine until then, but will need a 1 year supply if labs are normal then.  Ty!

## 2023-05-02 ENCOUNTER — Inpatient Hospital Stay (HOSPITAL_BASED_OUTPATIENT_CLINIC_OR_DEPARTMENT_OTHER): Payer: BC Managed Care – PPO | Admitting: Hematology and Oncology

## 2023-05-02 ENCOUNTER — Inpatient Hospital Stay: Payer: BC Managed Care – PPO | Attending: Hematology and Oncology

## 2023-05-02 VITALS — BP 139/83 | HR 63 | Temp 97.7°F | Resp 16 | Wt 142.9 lb

## 2023-05-02 VITALS — BP 138/88 | HR 57 | Temp 98.2°F | Resp 18

## 2023-05-02 DIAGNOSIS — Z5112 Encounter for antineoplastic immunotherapy: Secondary | ICD-10-CM | POA: Insufficient documentation

## 2023-05-02 DIAGNOSIS — Z170 Estrogen receptor positive status [ER+]: Secondary | ICD-10-CM

## 2023-05-02 DIAGNOSIS — C50411 Malignant neoplasm of upper-outer quadrant of right female breast: Secondary | ICD-10-CM | POA: Diagnosis present

## 2023-05-02 DIAGNOSIS — Z95828 Presence of other vascular implants and grafts: Secondary | ICD-10-CM

## 2023-05-02 MED ORDER — TRASTUZUMAB-ANNS CHEMO 420 MG IV SOLR
6.0000 mg/kg | Freq: Once | INTRAVENOUS | Status: AC
  Administered 2023-05-02: 420 mg via INTRAVENOUS
  Filled 2023-05-02: qty 20

## 2023-05-02 MED ORDER — DIPHENHYDRAMINE HCL 25 MG PO CAPS
25.0000 mg | ORAL_CAPSULE | Freq: Once | ORAL | Status: AC
  Administered 2023-05-02: 25 mg via ORAL
  Filled 2023-05-02: qty 1

## 2023-05-02 MED ORDER — ACETAMINOPHEN 325 MG PO TABS
650.0000 mg | ORAL_TABLET | Freq: Once | ORAL | Status: AC
  Administered 2023-05-02: 650 mg via ORAL
  Filled 2023-05-02: qty 2

## 2023-05-02 MED ORDER — SODIUM CHLORIDE 0.9% FLUSH
10.0000 mL | INTRAVENOUS | Status: DC | PRN
  Administered 2023-05-02: 10 mL

## 2023-05-02 MED ORDER — SODIUM CHLORIDE 0.9 % IV SOLN: Freq: Once | INTRAVENOUS | Status: AC

## 2023-05-02 MED ORDER — HEPARIN SOD (PORK) LOCK FLUSH 100 UNIT/ML IV SOLN
500.0000 [IU] | Freq: Once | INTRAVENOUS | Status: AC | PRN
  Administered 2023-05-02: 500 [IU]

## 2023-05-02 NOTE — Assessment & Plan Note (Signed)
Dawn Bates is a 52 year old woman with stage IA triple positive right breast cancer diagnosed in 05/2022 s/p lumpectomy, adjuvant chemotherapy, continuing on maintenance Herceptin, and Tamoxifen here for follow up.  Breast Cancer Patient is currently on Herceptin and Tamoxifen with no new problems reported. Ejection fraction has decreased slightly but is still within acceptable limits for continuation of Herceptin. -Continue Herceptin and Tamoxifen as prescribed. -Perform another echocardiogram in January to monitor ejection fraction.  Pruritus Patient reports occasional itching all over the body, cause unknown. -Monitor and report if symptoms persist or worsen.  Iron Deficiency Patient is taking iron every other day and hemoglobin levels are slowly improving. -Continue iron supplementation every other day.  Colonoscopy Scheduled for January 27th. -Continue with scheduled colonoscopy.  General Health Maintenance -Continue with current diet and exercise regimen. -Continue taking Vitamin D as currently prescribed. -Schedule follow-up appointments for December 3rd and January 14th. -Plan for port removal a few weeks after last appointment.

## 2023-05-02 NOTE — Progress Notes (Signed)
[facility] Cancer Follow up:    Bates, Dawn G, MD [address] [place] [address]   DIAGNOSIS:  Cancer Staging  Malignant neoplasm of upper-outer quadrant of right breast in female, estrogen receptor positive (HCC) Staging form: Breast, AJCC 8th Edition - Clinical stage from 06/06/2022: Stage IA (cT1c, cN0, cM0, G3, ER+, PR+, HER2+) - Signed by [name], MD on 07/19/2022 Stage prefix: Initial diagnosis Histologic grading system: 3 grade system - Pathologic stage from 07/05/2022: Stage IA (pT1c, pN0, cM0, G3, ER+, PR+, HER2+) - Signed by [name], NP on 08/02/2022 Stage prefix: Initial diagnosis Histologic grading system: 3 grade system   SUMMARY OF ONCOLOGIC HISTORY: Oncology History  Malignant neoplasm of upper-outer quadrant of right breast in female, estrogen receptor positive (HCC)  05/26/2022 Mammogram   Diagnostic mammogram after patient noticed a palpable lump in the right breast showed suspicious mass at the palpable site of concern in the right breast at 10:00 measuring 1.2 cm. Ultrasound of the right breast showed no evidence of lymphadenopathy.   06/06/2022 Pathology Results   Right breast needle core biopsy at 10:00 showed overall grade 2 invasive ductal carcinoma.  Prognostic showed ER 100% positive strong staining PR 5% positive strong staining, Ki-67 of 50% HER2 3+ by [facility]   06/06/2022 Cancer Staging   Staging form: Breast, AJCC 8th Edition - Clinical stage from 06/06/2022: Stage IA (cT1c, cN0, cM0, G3, ER+, PR+, HER2+) - Signed by [name], MD on 07/19/2022 Stage prefix: Initial diagnosis Histologic grading system: 3 grade system   06/26/2022 Genetic Testing   Negative [facility] CancerNext Expanded Panel.  Report date is 06/26/2022.   The CancerNext-Expanded gene panel offered by [facility] and includes sequencing and rearrangement analysis for the following 77 genes: AIP, ALK, APC, ATM, AXIN2, BAP1, BARD1, BLM,  BMPR1A, BRCA1, BRCA2, BRIP1, CDC73, CDH1, CDK4, CDKN1B, CDKN2A, CHEK2, CTNNA1, DICER1, FANCC, FH, FLCN, GALNT12, KIF1B, LZTR1, MAX, MEN1, MET, MLH1, MSH2, MSH3, MSH6, MUTYH, NBN, NF1, NF2, NTHL1, PALB2, PHOX2B, PMS2, POT1, PRKAR1A, PTCH1, PTEN, RAD51C, RAD51D, RB1, RECQL, RET, SDHA, SDHAF2, SDHB, SDHC, SDHD, SMAD4, SMARCA4, SMARCB1, SMARCE1, STK11, SUFU, TMEM127, TP53, TSC1, TSC2, VHL and XRCC2 (sequencing and deletion/duplication); EGFR, EGLN1, HOXB13, KIT, MITF, PDGFRA, POLD1, and POLE (sequencing only); EPCAM and GREM1 (deletion/duplication only).    06/28/2022 Initial Diagnosis   Malignant neoplasm of upper-outer quadrant of right breast in female, estrogen receptor positive (HCC)   07/05/2022 Surgery   Right breast lumpectomy: 1.3 cm invasive poorly differentiated ductal carcinoma grade 3, margins negative, 2 sentinel lymph nodes negative   07/05/2022 Cancer Staging   Staging form: Breast, AJCC 8th Edition - Pathologic stage from 07/05/2022: Stage IA (pT1c, pN0, cM0, G3, ER+, PR+, HER2+) - Signed by [name], NP on 08/02/2022 Stage prefix: Initial diagnosis Histologic grading system: 3 grade system   08/02/2022 -  Chemotherapy   Patient is on Treatment Plan : BREAST Paclitaxel + Trastuzumab q7d / Trastuzumab q21d     11/16/2022 - 12/13/2022 Radiation Therapy   Plan Name: Breast_R Site: Breast, Right Technique: 3D Mode: Photon Dose Per Fraction: 2.67 Gy Prescribed Dose (Delivered / Prescribed): 40.05 Gy / 40.05 Gy Prescribed Fxs (Delivered / Prescribed): 15 / 15   Plan Name: Breast_R_Bst Site: Breast, Right Technique: 3D Mode: Photon Dose Per Fraction: 2 Gy Prescribed Dose (Delivered / Prescribed): 10 Gy / 10 Gy Prescribed Fxs (Delivered / Prescribed): 5 / 5   12/2022 -  Anti-estrogen oral therapy   Tamoxifen daily  CURRENT THERAPY: tamoxifen/Herceptin  INTERVAL HISTORY:  Dawn Bates 52 y.o. female returns for f/u and evaluation prior to receiving  Herceptin.  Discussed the use of AI scribe software for clinical note transcription with the patient, who gave verbal consent to proceed.  History of Present Illness    The patient, with a history of breast cancer, is currently on Herceptin and tamoxifen. She reports no new problems with these medications. The patient has been experiencing itching, which occurs sporadically and does not seem to be related to herceptin or tamoxifen. The patient is also taking iron supplements every other day due to a history of iron deficiency. She is due for a colonoscopy, which has been delayed due to the doctor's injury. The patient also mentions a feeling of fluid retention in one breast, which is heavier than the other. She has been doing exercises and wearing a compression bra to manage this. The patient also mentions a nasal sound in her voice, which she attributes to allergies.  Her most recent echo EF, 55-60%, Normal global Longitudinal strain, last ECHO with EF of 60-65%   Patient Active Problem List   Diagnosis Date Noted   Routine general medical examination at a health care facility 10/19/2022   Chronic idiopathic constipation 09/30/2022   Iron deficiency anemia 09/30/2022   Port-A-Cath in place 08/02/2022   Genetic testing 07/20/2022   Malignant neoplasm of upper-outer quadrant of right breast in female, estrogen receptor positive (HCC) 06/28/2022   Family history of breast cancer 06/21/2022   Abnormal uterine and vaginal bleeding, unspecified 10/04/2021   S/P laparoscopic assisted vaginal hysterectomy (LAVH) 10/04/2021   Hypothyroidism 07/28/2021   Menorrhagia 07/28/2021   Bilateral lower extremity edema 04/23/2021   Primary hypertension 02/12/2021   Elevated LFTs 05/22/2020   Thyroid nodule 10/10/2019   Headache, unspecified headache type 07/16/2019   Non-seasonal allergic rhinitis due to pollen 07/16/2019   Vitamin D deficiency, unspecified 07/16/2019   Hyperlipidemia 07/16/2019    Anemia 08/25/2017    is allergic to latex.  MEDICAL HISTORY: Past Medical History:  Diagnosis Date   Abnormal uterine bleeding 2022   Anemia 02/12/2021   Patient is taking iron supplementation.   Cancer [facility])    breast   COVID-19 2020   cold-like symptoms   Endometriosis    Fibroids    Graves disease    Hypertension    Hyperthyroidism 2022   s/p Nuclear Medicine RAI therapy on 08/07/20   Seasonal allergies    severe chronic allergies, runny nose, congestion, cough when she lays down due to drainage   Vitamin D deficiency 2021   Wears glasses     SURGICAL HISTORY: Past Surgical History:  Procedure Laterality Date   BREAST BIOPSY Right 06/06/2022   [place] RT BREAST BX W LOC DEV 1ST LESION IMG BX SPEC US GUIDE 06/06/2022 [facility] MAMMOGRAPHY   BREAST LUMPECTOMY WITH SENTINEL LYMPH NODE BIOPSY Right 07/05/2022   Procedure: RIGHT BREAST LUMPECTOMY WITH SENTINEL LYMPH NODE BX;  Surgeon: Almond Lint, MD;  Location: [facility];  Service: General;  Laterality: Right;   CYSTOSCOPY  10/04/2021   Procedure: CYSTOSCOPY;  Surgeon: Jaymes Graff, MD;  Location: [facility];  Service: Gynecology;;   LAPAROSCOPIC VAGINAL HYSTERECTOMY WITH SALPINGECTOMY N/A 10/04/2021   Procedure: LAPAROSCOPIC ASSISTED VAGINAL HYSTERECTOMY WITH SALPINGECTOMY;  Surgeon: Jaymes Graff, MD;  Location: [facility];  Service: Gynecology;  Laterality: N/A;   PORTACATH PLACEMENT Left 07/05/2022   Procedure: INSERTION PORT-A-CATH;  Surgeon: Almond Lint,  MD;  Location: [facility];  Service: General;  Laterality: Left;   TUBAL LIGATION  09/2008    SOCIAL HISTORY: Social History   Socioeconomic History   Marital status: Married    Spouse name: Not on file   Number of children: Not on file   Years of education: Not on file   Highest education level: Not on file  Occupational History   Not on file  Tobacco Use   Smoking status: Never   Smokeless tobacco:  Never  Vaping Use   Vaping status: Never Used  Substance and Sexual Activity   Alcohol use: No   Drug use: No   Sexual activity: Yes    Partners: Male    Birth control/protection: Surgical    Comment: BTL   Other Topics Concern   Not on file  Social History Narrative   Not on file   Social Determinants of Health   Financial Resource Strain: Not on file  Food Insecurity: No Food Insecurity (01/12/2023)   Hunger Vital Sign    Worried About Running Out of Food in the Last Year: Never true    Ran Out of Food in the Last Year: Never true  Transportation Needs: No Transportation Needs (01/12/2023)   PRAPARE - Administrator, Civil Service (Medical): No    Lack of Transportation (Non-Medical): No  Physical Activity: Not on file  Stress: Not on file  Social Connections: Not on file  Intimate Partner Violence: Not At Risk (01/12/2023)   Humiliation, Afraid, Rape, and Kick questionnaire    Fear of Current or Ex-Partner: No    Emotionally Abused: No    Physically Abused: No    Sexually Abused: No    FAMILY HISTORY: Family History  Problem Relation Age of Onset   Breast cancer Sister 72       triple negative; d. early 67s   Cancer Paternal Aunt        unknown type   Thyroid cancer Half-Sister        mat half sister; dx 63s; unknown cell type    Review of Systems  Constitutional:  Negative for appetite change, chills, fatigue, fever and unexpected weight change.  HENT:   Negative for hearing loss, lump/mass and trouble swallowing.   Eyes:  Negative for eye problems and icterus.  Respiratory:  Negative for chest tightness, cough and shortness of breath.   Cardiovascular:  Negative for chest pain, leg swelling and palpitations.  Gastrointestinal:  Negative for abdominal distention, abdominal pain, constipation, diarrhea, nausea and vomiting.  Endocrine: Negative for hot flashes.  Genitourinary:  Negative for difficulty urinating.   Musculoskeletal:  Negative for  arthralgias.  Skin:  Negative for itching and rash.  Neurological:  Negative for dizziness, extremity weakness, headaches and numbness.  Hematological:  Negative for adenopathy. Does not bruise/bleed easily.  Psychiatric/Behavioral:  Negative for depression. The patient is not nervous/anxious.       PHYSICAL EXAMINATION     Vitals:   05/02/23 1454  BP: 139/83  Pulse: 63  Resp: 16  Temp: 97.7 F (36.5 C)  SpO2: 97%    Physical Exam Constitutional:      General: She is not in acute distress.    Appearance: Normal appearance. She is not toxic-appearing.  HENT:     Head: Normocephalic and atraumatic.     Mouth/Throat:     Mouth: Mucous membranes are moist.     Pharynx: Oropharynx is clear. No oropharyngeal exudate or posterior  oropharyngeal erythema.  Eyes:     General: No scleral icterus. Cardiovascular:     Rate and Rhythm: Normal rate and regular rhythm.     Pulses: Normal pulses.     Heart sounds: Normal heart sounds.  Pulmonary:     Effort: Pulmonary effort is normal.     Breath sounds: Normal breath sounds.  Abdominal:     General: Abdomen is flat. Bowel sounds are normal. There is no distension.     Palpations: Abdomen is soft.     Tenderness: There is no abdominal tenderness.  Musculoskeletal:        General: No swelling.     Cervical back: Neck supple.  Lymphadenopathy:     Cervical: No cervical adenopathy.  Skin:    General: Skin is warm and dry.     Findings: No rash.  Neurological:     General: No focal deficit present.     Mental Status: She is alert.  Psychiatric:        Mood and Affect: Mood normal.        Behavior: Behavior normal.     LABORATORY DATA:  CBC    Component Value Date/Time   WBC 3.4 (L) 03/22/2023 1327   WBC 9.5 10/05/2021 0046   RBC 4.46 03/22/2023 1327   HGB 11.2 (L) 03/22/2023 1327   HCT 35.7 (L) 03/22/2023 1327   PLT 142 (L) 03/22/2023 1327   MCV 80.0 03/22/2023 1327   MCH 25.1 (L) 03/22/2023 1327   MCHC 31.4  03/22/2023 1327   RDW 19.0 (H) 03/22/2023 1327   LYMPHSABS 1.4 03/22/2023 1327   MONOABS 0.2 03/22/2023 1327   EOSABS 0.1 03/22/2023 1327   BASOSABS 0.0 03/22/2023 1327    CMP     Component Value Date/Time   NA 140 03/22/2023 1327   K 3.6 03/22/2023 1327   CL 107 03/22/2023 1327   CO2 30 03/22/2023 1327   GLUCOSE 101 (H) 03/22/2023 1327   BUN 14 03/22/2023 1327   CREATININE 0.92 03/22/2023 1327   CREATININE 0.78 04/23/2021 1505   CALCIUM 9.8 03/22/2023 1327   PROT 7.1 03/22/2023 1327   ALBUMIN 4.0 03/22/2023 1327   AST 16 03/22/2023 1327   ALT 12 03/22/2023 1327   ALKPHOS 54 03/22/2023 1327   BILITOT 0.3 03/22/2023 1327   GFRNONAA >60 03/22/2023 1327   GFRNONAA 115 05/21/2020 1708   GFRAA 133 05/21/2020 1708      ASSESSMENT and THERAPY PLAN:   Malignant neoplasm of upper-outer quadrant of right breast in female, estrogen receptor positive (HCC) Dawn Bates is a 52 year old woman with stage IA triple positive right breast cancer diagnosed in 05/2022 s/p lumpectomy, adjuvant chemotherapy, continuing on maintenance Herceptin, and Tamoxifen here for follow up.  Breast Cancer Patient is currently on Herceptin and Tamoxifen with no new problems reported. Ejection fraction has decreased slightly but is still within acceptable limits for continuation of Herceptin. -Continue Herceptin and Tamoxifen as prescribed. -Perform another echocardiogram in January to monitor ejection fraction.  Pruritus Patient reports occasional itching all over the body, cause unknown. -Monitor and report if symptoms persist or worsen.  Iron Deficiency Patient is taking iron every other day and hemoglobin levels are slowly improving. -Continue iron supplementation every other day.  Colonoscopy Scheduled for January 27th. -Continue with scheduled colonoscopy.  General Health Maintenance -Continue with current diet and exercise regimen. -Continue taking Vitamin D as currently  prescribed. -Schedule follow-up appointments for December 3rd and January 14th. -Plan for port removal  a few weeks after last appointment.    All questions were answered. The patient knows to call the clinic with any problems, questions or concerns. We can certainly see the patient much sooner if necessary.  Total encounter time:30 minutes*in face-to-face visit time, chart review, lab review, care coordination, order entry, and documentation of the encounter time.   *Total Encounter Time as defined by the Centers for Medicare and Medicaid Services includes, in addition to the face-to-face time of a patient visit (documented in the note above) non-face-to-face time: obtaining and reviewing outside history, ordering and reviewing medications, tests or procedures, care coordination (communications with other health care professionals or caregivers) and documentation in the medical record.

## 2023-05-02 NOTE — Patient Instructions (Signed)
 [facility] - A DEPT OF [facility]  Discharge Instructions: Thank you for choosing [facility] to provide your oncology and hematology care.   If you have a lab appointment with the Cancer Center, please go directly to the Cancer Center and check in at the registration area.   Wear comfortable clothing and clothing appropriate for easy access to any Portacath or PICC line.   We strive to give you quality time with your provider. You may need to reschedule your appointment if you arrive late (15 or more minutes).  Arriving late affects you and other patients whose appointments are after yours.  Also, if you miss three or more appointments without notifying the office, you may be dismissed from the clinic at the provider's discretion.      For prescription refill requests, have your pharmacy contact our office and allow 72 hours for refills to be completed.    Today you received the following chemotherapy and/or immunotherapy agent: Trastuzumab (Kanjinti)      To help prevent nausea and vomiting after your treatment, we encourage you to take your nausea medication as directed.  BELOW ARE SYMPTOMS THAT SHOULD BE REPORTED IMMEDIATELY: *FEVER GREATER THAN 100.4 F (38 C) OR HIGHER *CHILLS OR SWEATING *NAUSEA AND VOMITING THAT IS NOT CONTROLLED WITH YOUR NAUSEA MEDICATION *UNUSUAL SHORTNESS OF BREATH *UNUSUAL BRUISING OR BLEEDING *URINARY PROBLEMS (pain or burning when urinating, or frequent urination) *BOWEL PROBLEMS (unusual diarrhea, constipation, pain near the anus) TENDERNESS IN MOUTH AND THROAT WITH OR WITHOUT PRESENCE OF ULCERS (sore throat, sores in mouth, or a toothache) UNUSUAL RASH, SWELLING OR PAIN  UNUSUAL VAGINAL DISCHARGE OR ITCHING   Items with * indicate a potential emergency and should be followed up as soon as possible or go to the Emergency Department if any problems should occur.  Please show the CHEMOTHERAPY ALERT CARD or  IMMUNOTHERAPY ALERT CARD at check-in to the Emergency Department and triage nurse.  Should you have questions after your visit or need to cancel or reschedule your appointment, please contact [facility] - A DEPT OF [facility]  Dept: [phone]  and follow the prompts.  Office hours are 8:00 a.m. to 4:30 p.m. Monday - Friday. Please note that voicemails left after 4:00 p.m. may not be returned until the following business day.  We are closed weekends and major holidays. You have access to a nurse at all times for urgent questions. Please call the main number to the clinic Dept: [phone] and follow the prompts.   For any non-urgent questions, you may also contact your provider using MyChart. We now offer e-Visits for anyone 64 and older to request care online for non-urgent symptoms. For details visit [email or web address].   Also download the MyChart app! Go to the app store, search "MyChart", open the app, select [facility], and log in with your MyChart username and password.  Trastuzumab Injection What is this medication? TRASTUZUMAB (tras TOO zoo mab) treats breast cancer and stomach cancer. It works by blocking a protein that causes cancer cells to grow and multiply. This helps to slow or stop the spread of cancer cells. This medicine may be used for other purposes; ask your health care provider or pharmacist if you have questions. COMMON BRAND NAME(S): Herceptin, Marlowe Alt, Ontruzant, Trazimera What should I tell my care team before I take this medication? They need to know if you have any of these conditions: Heart failure  Lung disease An unusual or allergic reaction to trastuzumab, other medications, foods, dyes, or preservatives Pregnant or trying to get pregnant Breast-feeding How should I use this medication? This medication is injected into a vein. It is given by your care team in a hospital or clinic setting. Talk to your  care team about the use of this medication in children. It is not approved for use in children. Overdosage: If you think you have taken too much of this medicine contact a poison control center or emergency room at once. NOTE: This medicine is only for you. Do not share this medicine with others. What if I miss a dose? Keep appointments for follow-up doses. It is important not to miss your dose. Call your care team if you are unable to keep an appointment. What may interact with this medication? Certain types of chemotherapy, such as daunorubicin, doxorubicin, epirubicin, idarubicin This list may not describe all possible interactions. Give your health care provider a list of all the medicines, herbs, non-prescription drugs, or dietary supplements you use. Also tell them if you smoke, drink alcohol, or use illegal drugs. Some items may interact with your medicine. What should I watch for while using this medication? Your condition will be monitored carefully while you are receiving this medication. This medication may make you feel generally unwell. This is not uncommon, as chemotherapy affects healthy cells as well as cancer cells. Report any side effects. Continue your course of treatment even though you feel ill unless your care team tells you to stop. This medication may increase your risk of getting an infection. Call your care team for advice if you get a fever, chills, sore throat, or other symptoms of a cold or flu. Do not treat yourself. Try to avoid being around people who are sick. Avoid taking medications that contain aspirin, acetaminophen, ibuprofen, naproxen, or ketoprofen unless instructed by your care team. These medications can hide a fever. Talk to your care team if you may be pregnant. Serious birth defects can occur if you take this medication during pregnancy and for 7 months after the last dose. You will need a negative pregnancy test before starting this medication. Contraception  is recommended while taking this medication and for 7 months after the last dose. Your care team can help you find the option that works for you. Do not breastfeed while taking this medication and for 7 months after stopping treatment. What side effects may I notice from receiving this medication? Side effects that you should report to your care team as soon as possible: Allergic reactions or angioedema--skin rash, itching or hives, swelling of the face, eyes, lips, tongue, arms, or legs, trouble swallowing or breathing Dry cough, shortness of breath or trouble breathing Heart failure--shortness of breath, swelling of the ankles, feet, or hands, sudden weight gain, unusual weakness or fatigue Infection--fever, chills, cough, or sore throat Infusion reactions--chest pain, shortness of breath or trouble breathing, feeling faint or lightheaded Side effects that usually do not require medical attention (report to your care team if they continue or are bothersome): Diarrhea Dizziness Headache Nausea Trouble sleeping Vomiting This list may not describe all possible side effects. Call your doctor for medical advice about side effects. You may report side effects to FDA at 1-800-FDA-1088. Where should I keep my medication? This medication is given in a hospital or clinic. It will not be stored at home. NOTE: This sheet is a summary. It may not cover all possible information. If you have questions  about this medicine, talk to your doctor, pharmacist, or health care provider.  2024 Elsevier/Gold Standard (2021-10-19 00:00:00)

## 2023-05-06 ENCOUNTER — Other Ambulatory Visit: Payer: Self-pay | Admitting: Hematology and Oncology

## 2023-05-06 DIAGNOSIS — Z170 Estrogen receptor positive status [ER+]: Secondary | ICD-10-CM

## 2023-05-23 ENCOUNTER — Inpatient Hospital Stay: Payer: BC Managed Care – PPO | Attending: Hematology and Oncology

## 2023-05-23 ENCOUNTER — Encounter: Payer: Self-pay | Admitting: *Deleted

## 2023-05-23 VITALS — BP 132/78 | HR 62 | Temp 98.2°F | Resp 18 | Wt 141.5 lb

## 2023-05-23 DIAGNOSIS — Z5112 Encounter for antineoplastic immunotherapy: Secondary | ICD-10-CM | POA: Insufficient documentation

## 2023-05-23 DIAGNOSIS — Z170 Estrogen receptor positive status [ER+]: Secondary | ICD-10-CM | POA: Insufficient documentation

## 2023-05-23 DIAGNOSIS — C50411 Malignant neoplasm of upper-outer quadrant of right female breast: Secondary | ICD-10-CM | POA: Diagnosis present

## 2023-05-23 DIAGNOSIS — Z95828 Presence of other vascular implants and grafts: Secondary | ICD-10-CM

## 2023-05-23 MED ORDER — ACETAMINOPHEN 325 MG PO TABS
650.0000 mg | ORAL_TABLET | Freq: Once | ORAL | Status: AC
  Administered 2023-05-23: 650 mg via ORAL
  Filled 2023-05-23: qty 2

## 2023-05-23 MED ORDER — SODIUM CHLORIDE 0.9 % IV SOLN: Freq: Once | INTRAVENOUS | Status: AC

## 2023-05-23 MED ORDER — TRASTUZUMAB-ANNS CHEMO 150 MG IV SOLR
6.0000 mg/kg | Freq: Once | INTRAVENOUS | Status: AC
  Administered 2023-05-23: 420 mg via INTRAVENOUS
  Filled 2023-05-23: qty 20

## 2023-05-23 MED ORDER — DIPHENHYDRAMINE HCL 25 MG PO CAPS
25.0000 mg | ORAL_CAPSULE | Freq: Once | ORAL | Status: AC
  Administered 2023-05-23: 25 mg via ORAL
  Filled 2023-05-23: qty 1

## 2023-05-23 NOTE — Patient Instructions (Signed)
[facility] - A DEPT OF [facility]  Discharge Instructions: Thank you for choosing [facility] to provide your oncology and hematology care.   If you have a lab appointment with the Cancer Center, please go directly to the Cancer Center and check in at the registration area.   Wear comfortable clothing and clothing appropriate for easy access to any Portacath or PICC line.   We strive to give you quality time with your provider. You may need to reschedule your appointment if you arrive late (15 or more minutes).  Arriving late affects you and other patients whose appointments are after yours.  Also, if you miss three or more appointments without notifying the office, you may be dismissed from the clinic at the provider's discretion.      For prescription refill requests, have your pharmacy contact our office and allow 72 hours for refills to be completed.    Today you received the following chemotherapy and/or immunotherapy agents : Trastuzumab      To help prevent nausea and vomiting after your treatment, we encourage you to take your nausea medication as directed.  BELOW ARE SYMPTOMS THAT SHOULD BE REPORTED IMMEDIATELY: *FEVER GREATER THAN 100.4 F (38 C) OR HIGHER *CHILLS OR SWEATING *NAUSEA AND VOMITING THAT IS NOT CONTROLLED WITH YOUR NAUSEA MEDICATION *UNUSUAL SHORTNESS OF BREATH *UNUSUAL BRUISING OR BLEEDING *URINARY PROBLEMS (pain or burning when urinating, or frequent urination) *BOWEL PROBLEMS (unusual diarrhea, constipation, pain near the anus) TENDERNESS IN MOUTH AND THROAT WITH OR WITHOUT PRESENCE OF ULCERS (sore throat, sores in mouth, or a toothache) UNUSUAL RASH, SWELLING OR PAIN  UNUSUAL VAGINAL DISCHARGE OR ITCHING   Items with * indicate a potential emergency and should be followed up as soon as possible or go to the Emergency Department if any problems should occur.  Please show the CHEMOTHERAPY ALERT CARD or  IMMUNOTHERAPY ALERT CARD at check-in to the Emergency Department and triage nurse.  Should you have questions after your visit or need to cancel or reschedule your appointment, please contact [facility] - A DEPT OF [facility]  Dept: [phone]  and follow the prompts.  Office hours are 8:00 a.m. to 4:30 p.m. Monday - Friday. Please note that voicemails left after 4:00 p.m. may not be returned until the following business day.  We are closed weekends and major holidays. You have access to a nurse at all times for urgent questions. Please call the main number to the clinic Dept: [phone] and follow the prompts.   For any non-urgent questions, you may also contact your provider using MyChart. We now offer e-Visits for anyone 24 and older to request care online for non-urgent symptoms. For details visit [email or web address].   Also download the MyChart app! Go to the app store, search "MyChart", open the app, select [facility], and log in with your MyChart username and password.

## 2023-05-26 ENCOUNTER — Encounter: Payer: Self-pay | Admitting: *Deleted

## 2023-05-26 ENCOUNTER — Other Ambulatory Visit: Payer: BC Managed Care – PPO

## 2023-05-26 ENCOUNTER — Telehealth: Payer: Self-pay

## 2023-05-26 DIAGNOSIS — Z8639 Personal history of other endocrine, nutritional and metabolic disease: Secondary | ICD-10-CM

## 2023-05-26 NOTE — Telephone Encounter (Signed)
Orders Placed This Encounter Procedures ? TSH ? T4, free

## 2023-05-27 LAB — T4, FREE: Free T4: 1.1 ng/dL (ref 0.8–1.8)

## 2023-05-27 LAB — TSH: TSH: 1.99 m[IU]/L

## 2023-05-29 ENCOUNTER — Telehealth: Payer: Self-pay | Admitting: *Deleted

## 2023-05-29 NOTE — Telephone Encounter (Signed)
Patient called with concerns about dry cough and cognitive issues.  Has had dry cough since last treatment 12/3. Denies being short of breath. Denies sinus drainage.  Has small sore inside one nostril. Denies fever. Cough has stayed same, has not improved or become worse since starting.  She states she is having cognitive issues - At times she has had difficulty recognizing numbers and transposing them when they are in sequence.  She has not seen her PCP about either concern.  Dr. Al Pimple sent message with patient's concerns

## 2023-05-31 ENCOUNTER — Telehealth: Payer: Self-pay | Admitting: *Deleted

## 2023-05-31 ENCOUNTER — Ambulatory Visit
Admission: RE | Admit: 2023-05-31 | Discharge: 2023-05-31 | Disposition: A | Discharge status: Home / Self Care | Payer: BC Managed Care – PPO | Source: Ambulatory Visit | Attending: Hematology and Oncology | Admitting: Hematology and Oncology

## 2023-05-31 DIAGNOSIS — C50411 Malignant neoplasm of upper-outer quadrant of right female breast: Secondary | ICD-10-CM

## 2023-05-31 HISTORY — DX: Personal history of irradiation: Z92.3

## 2023-05-31 HISTORY — DX: Personal history of antineoplastic chemotherapy: Z92.21

## 2023-05-31 NOTE — Telephone Encounter (Signed)
This RN spoke with pt today per follow up from call on 12/9 with pt stating onset of cough - as well as some cognitive impairment.  Pt is currently completing therapy with herceptin and is on tamoxifen for her triple positive stage IA breast cancer.  Per discussion pt is scheduled to see her Primary MD - Dr Betty [name] tomorrow for the cough- which she states is mildly productive.  She does not have any fevers.  She will discuss her concern with noted issues with changes she has noted in her cognition including recall and transposition of numbers.  Of note pt is able to speak clearly with appropriate words and pace in discussion.  This RN informed pt to call this RN post visit for update for possible other recommendations if needed.  Pt verbalized understanding.

## 2023-06-02 ENCOUNTER — Ambulatory Visit (INDEPENDENT_AMBULATORY_CARE_PROVIDER_SITE_OTHER): Payer: BC Managed Care – PPO | Admitting: Family Medicine

## 2023-06-02 ENCOUNTER — Other Ambulatory Visit: Payer: Self-pay | Admitting: Hematology and Oncology

## 2023-06-02 ENCOUNTER — Encounter: Payer: Self-pay | Admitting: Family Medicine

## 2023-06-02 ENCOUNTER — Ambulatory Visit (INDEPENDENT_AMBULATORY_CARE_PROVIDER_SITE_OTHER)
Admission: RE | Admit: 2023-06-02 | Discharge: 2023-06-02 | Disposition: A | Discharge status: Home / Self Care | Payer: BC Managed Care – PPO | Source: Ambulatory Visit | Attending: Family Medicine | Admitting: Family Medicine

## 2023-06-02 VITALS — BP 120/80 | HR 67 | Temp 98.4°F | Resp 12 | Ht 66.0 in | Wt 139.0 lb

## 2023-06-02 DIAGNOSIS — R413 Other amnesia: Secondary | ICD-10-CM

## 2023-06-02 DIAGNOSIS — J181 Lobar pneumonia, unspecified organism: Secondary | ICD-10-CM

## 2023-06-02 DIAGNOSIS — D509 Iron deficiency anemia, unspecified: Secondary | ICD-10-CM

## 2023-06-02 DIAGNOSIS — R059 Cough, unspecified: Secondary | ICD-10-CM

## 2023-06-02 DIAGNOSIS — Z170 Estrogen receptor positive status [ER+]: Secondary | ICD-10-CM

## 2023-06-02 MED ORDER — FLUTICASONE PROPIONATE 50 MCG/ACT NA SUSP: 2.0000 | Freq: Every day | NASAL | 2 refills | Status: DC

## 2023-06-02 NOTE — Progress Notes (Signed)
ACUTE VISIT Chief Complaint  Patient presents with   Cough    Ongoing for over 2 weeks, productive with yellow/brown/green mucus    HPI: [name] is a 52 y.o. female with a PMHx significant for vitamin D deficiency, HLD, thyroid nodule, HTN, postablative hypothyroidism, breast cancer, and iron deficiency anemia, who is here today complaining of cough and rhinorrhea.   She complains of persistent productive cough for 2 weeks. She mentions she threw up once when returning from Thanksgiving dinner and has had symptoms ever since.  She endorses associated pain in left rib cage while coughing with occasional sob. Rhinorrhea, nasal congestion, and sore throat when coughing.  She has tried several OTCs and home remedies including vitamin C, ginger, lemon, and increased hydration among some. Pertinent negatives include fever, chills,headaches,changes in appetite, chest pain, or heartburn. No known sick contacts. No history of asthma or tobacco use.   Cognitive issues:  Patient also complains of some memory problems that may be affecting her job. Problem has been going on for "a little while",a few months. She occasionally forgets what she has said in conversations or reads out the wrong number from a list. She says she often has trouble remembering dates, but recalls today's date without difficulty.  Lab Results  Component Value Date   NA 140 03/22/2023   CL 107 03/22/2023   K 3.6 03/22/2023   CO2 30 03/22/2023   BUN 14 03/22/2023   CREATININE 0.92 03/22/2023   GFRNONAA >60 03/22/2023   CALCIUM 9.8 03/22/2023   ALBUMIN 4.0 03/22/2023   GLUCOSE 101 (H) 03/22/2023   Lab Results  Component Value Date   TSH 1.99 05/26/2023    FMHx of dementia in her mom. Her mom was dx'ed in her 75s and she believes it was related to a stroke.  She is still on Herceptin infusions and tamoxifen for breast cancer.  According to pt, her oncologist suggested could have been caused by chemo.  She is not sure if she is still receiving chemo and inquires about medication she is getting regularly through her port.  She is also requesting labs to check anemia. Last cbc 03/22/23 H/H 11.2/35.7 (9.9/33.2). She has not noted blood in stool or melena. She is taking Vitamin D and iron supplementation.  Review of Systems  Constitutional:  Negative for chills and fever.  HENT:  Positive for postnasal drip, rhinorrhea and sore throat. Negative for ear pain.   Respiratory:  Positive for cough.   Gastrointestinal:  Negative for abdominal pain, nausea and vomiting.  Endocrine: Negative for cold intolerance and heat intolerance.  Genitourinary:  Negative for decreased urine volume, dysuria and hematuria.  Skin:  Negative for rash.  Neurological:  Negative for syncope, facial asymmetry and weakness.  Psychiatric/Behavioral:  Negative for hallucinations.   See other pertinent positives and negatives in HPI.  Current Outpatient Medications on File Prior to Visit  Medication Sig Dispense Refill   acetaminophen (TYLENOL) 500 MG tablet Take 500 mg by mouth every 8 (eight) hours as needed for mild pain ([place]).     amLODipine (NORVASC) 5 MG tablet TAKE 1 TABLET (5 MG TOTAL) BY MOUTH DAILY. 90 tablet 3   Calcium-Phosphorus-Vitamin D (CITRACAL +D3 PO) Take by mouth.     ferrous sulfate 325 (65 FE) MG tablet Take 325 mg by mouth daily with breakfast.     levothyroxine (SYNTHROID) 50 MCG tablet Take 1 tablet (50 mcg total) by mouth daily. 45 tablet 1   lidocaine-prilocaine (  EMLA) cream Apply to affected area once 30 g 3   valACYclovir (VALTREX) 500 MG tablet Take 500 mg by mouth as needed.     No current facility-administered medications on file prior to visit.   Past Medical History:  Diagnosis Date   Abnormal uterine bleeding 2022   Anemia 02/12/2021   Patient is taking iron supplementation.   Cancer [facility])    breast   COVID-19 2020   cold-like symptoms   Endometriosis    Fibroids     Graves disease    Hypertension    Hyperthyroidism 2022   s/p Nuclear Medicine RAI therapy on 08/07/20   Personal history of chemotherapy    Personal history of radiation therapy    Seasonal allergies    severe chronic allergies, runny nose, congestion, cough when she lays down due to drainage   Vitamin D deficiency 2021   Wears glasses    Allergies  Allergen Reactions   Latex Itching    Social History   Socioeconomic History   Marital status: Married    Spouse name: Not on file   Number of children: Not on file   Years of education: Not on file   Highest education level: Not on file  Occupational History   Not on file  Tobacco Use   Smoking status: Never   Smokeless tobacco: Never  Vaping Use   Vaping status: Never Used  Substance and Sexual Activity   Alcohol use: No   Drug use: No   Sexual activity: Yes    Partners: Male    Birth control/protection: Surgical    Comment: BTL   Other Topics Concern   Not on file  Social History Narrative   Not on file   Social Drivers of Health   Financial Resource Strain: Not on file  Food Insecurity: No Food Insecurity (01/12/2023)   Hunger Vital Sign    Worried About Running Out of Food in the Last Year: Never true    Ran Out of Food in the Last Year: Never true  Transportation Needs: No Transportation Needs (01/12/2023)   PRAPARE - Administrator, Civil Service (Medical): No    Lack of Transportation (Non-Medical): No  Physical Activity: Not on file  Stress: Not on file  Social Connections: Not on file   Vitals:   06/02/23 1156  BP: 120/80  Pulse: 67  Resp: 12  Temp: 98.4 F (36.9 C)  SpO2: 97%   Body mass index is 22.44 kg/m.  Physical Exam Vitals and nursing note reviewed.  Constitutional:      General: She is not in acute distress.    Appearance: She is well-developed. She is not ill-appearing.  HENT:     Head: Normocephalic and atraumatic.     Right Ear: External ear normal.     Left Ear:  External ear normal. Tympanic membrane is not erythematous.     Ears:     Comments: Bilateral cerumen excess, TM seen partially in left.    Nose: Rhinorrhea present.     Right Turbinates: Enlarged.     Left Turbinates: Enlarged.     Right Sinus: No maxillary sinus tenderness or frontal sinus tenderness.     Left Sinus: No maxillary sinus tenderness or frontal sinus tenderness.     Mouth/Throat:     Mouth: Mucous membranes are moist.     Pharynx: Postnasal drip present.  Eyes:     Conjunctiva/sclera: Conjunctivae normal.  Cardiovascular:     Rate  and Rhythm: Normal rate and regular rhythm.     Heart sounds: No murmur heard. Pulmonary:     Effort: Pulmonary effort is normal. No respiratory distress.     Breath sounds: Normal breath sounds. No stridor.  Musculoskeletal:     Cervical back: No edema or erythema. No muscular tenderness.  Lymphadenopathy:     Head:     Right side of head: No submandibular adenopathy.     Left side of head: No submandibular adenopathy.     Cervical: No cervical adenopathy.  Skin:    General: Skin is warm.     Findings: No erythema or rash.  Neurological:     Mental Status: She is alert and oriented to person, place, and time.  Psychiatric:        Mood and Affect: Affect normal. Mood is anxious.   ASSESSMENT AND PLAN:  Ms. Zisk was seen today for cough and rhinorrhea.  Lab Results  Component Value Date   WBC 8.3 06/02/2023   HGB 10.8 (L) 06/02/2023   HCT 33.8 (L) 06/02/2023   MCV 87 06/02/2023   PLT 301 06/02/2023   Lab Results  Component Value Date   VITAMINB12 200 (L) 06/02/2023   Cough, unspecified type Problem has been going on for 2 weeks and started after episode of vomiting. Lung auscultation today negative. We discussed possible etiologies,including meds,allergies,and GI. Treatment options, including empiric abx treatment, she prefers to hold on meds for now. OTC plain mucinex and Flonase nasal spray may help. Instructed about  warning signs. CXR: I do not appreciate airspace disease/infiltrates, left-sided port in place.        -     Fluticasone Propionate; Place 2 sprays into both nostrils daily.  Dispense: 16 g; Refill: 2 -     DG Chest 2 View; Future  Iron deficiency anemia, unspecified iron deficiency anemia type She has not had colonoscopy. On chemo for breast cancer, Herceptin. Requesting labs. Further recommendations according to lab results.  -     CBC; Future -     Vitamin B12; Future  Memory difficulties Problem has been going on for a few months. Denies depression like symptoms. She feels like it is getting worse, no headaches. Given her hx of breast cancer, head imaging is appropriate. Neuropsychologic evaluation will be arranged. Cognitive challenging exercises may help.  -     CT HEAD WO CONTRAST ( ); Future -     Ambulatory referral to Neuropsychology -     Vitamin B12; Future  Return if symptoms worsen or fail to improve, for keep next appointment.  I, Rolla Etienne Wierda, acting as a scribe for Zurii Hewes [name], MD., have documented all relevant documentation on the behalf of Mindee Robledo [name], MD, as directed by  Stephanieann Popescu [name], MD while in the presence of Sedale Jenifer [name], MD.   I, Jaelie Aguilera [name], MD, have reviewed all documentation for this visit. The documentation on 06/03/23 for the exam, diagnosis, procedures, and orders are all accurate and complete.  Clariza Sickman G. [name], MD  [facility]. [place] office.

## 2023-06-02 NOTE — Patient Instructions (Addendum)
A few things to remember from today's visit:  Iron deficiency anemia, unspecified iron deficiency anemia type - Plan: CBC, Vitamin B12  Memory difficulties - Plan: CT HEAD WO CONTRAST ( ), Vitamin B12, Ambulatory referral to Neuropsychology  Cough, unspecified type - Plan: DG Chest 2 View Plain Mucinex. Nasal spray Flonase daily for 10-14 days. Nasal saline irrigations as needed.  If you need refills for medications you take chronically, please call your pharmacy. Do not use My Chart to request refills or for acute issues that need immediate attention. If you send a my chart message, it may take a few days to be addressed, specially if I am not in the office.  Please be sure medication list is accurate. If a new problem present, please set up appointment sooner than planned today.

## 2023-06-03 LAB — CBC
Hematocrit: 33.8 % — ABNORMAL LOW (ref 34.0–46.6)
Hemoglobin: 10.8 g/dL — ABNORMAL LOW (ref 11.1–15.9)
MCH: 27.9 pg (ref 26.6–33.0)
MCHC: 32 g/dL (ref 31.5–35.7)
MCV: 87 fL (ref 79–97)
Platelets: 301 10*3/uL (ref 150–450)
RBC: 3.87 x10E6/uL (ref 3.77–5.28)
RDW: 16.5 % — ABNORMAL HIGH (ref 11.7–15.4)
WBC: 8.3 10*3/uL (ref 3.4–10.8)

## 2023-06-03 LAB — VITAMIN B12: Vitamin B-12: 200 pg/mL — ABNORMAL LOW (ref 232–1245)

## 2023-06-05 ENCOUNTER — Telehealth: Payer: Self-pay | Admitting: Family Medicine

## 2023-06-05 MED ORDER — AMOXICILLIN-POT CLAVULANATE 875-125 MG PO TABS: 1.0000 | ORAL_TABLET | Freq: Two times a day (BID) | ORAL | 0 refills | Status: AC

## 2023-06-05 MED ORDER — DOXYCYCLINE HYCLATE 100 MG PO TABS: 100.0000 mg | ORAL_TABLET | Freq: Two times a day (BID) | ORAL | 0 refills | Status: AC

## 2023-06-05 NOTE — Telephone Encounter (Signed)
Spouse called, asking to speak with MD.  Spouse informed MD is currently with patients, and I could send a message for CMA to call them back. Spouse began yelling, asking why he cannot speak directly to MD, when she is the provider? Spouse was asked if call was an emergency, so that I could send a message to MD stating the emergency or transfer call to Triage. Spouse continued yelling and was very aggressive, saying his wife's health is an emergency, and he did not want a call back from anyone but the MD.  Spouse was reassured I would send MD his message, and someone would call him back. Spouse stated this is ridiculous and they should be able to speak with MD whenever they needed.  Spouse was reminded again that MD is currently with patients, but his message requesting a call back would be sent immediately.

## 2023-06-05 NOTE — Addendum Note (Signed)
Addended by: [name], Chen Saadeh G on: 06/05/2023 07:13 AM   Modules accepted: Orders

## 2023-06-05 NOTE — Telephone Encounter (Signed)
I called and spoke with patient and spouse. We went over her results that were sent over earlier this morning and they verbalized understanding. Pt's spouse apologized for raising his voice, but he was concerned about his wife's health due to her past medical conditions. All questions were answered and pt's spouse apologized again. Nothing further needed.

## 2023-06-09 ENCOUNTER — Other Ambulatory Visit: Payer: Self-pay

## 2023-06-09 ENCOUNTER — Encounter: Payer: Self-pay | Admitting: Hematology and Oncology

## 2023-06-09 ENCOUNTER — Telehealth: Payer: Self-pay

## 2023-06-09 NOTE — Telephone Encounter (Signed)
I spoke with pt's insurance company, [employer] is out of network. [facility] is in network. I called over to [facility], but their scheduling department was closed already. I called patient to make her aware. We canceled the appointment for Sunday, as insurance wouldn't pay for it to be done here. Advised we would call [facility] Monday morning to set up her CT. Pt verbalized understanding.   Auth dates: 06/08/23-07/07/23 #[id number]

## 2023-06-11 ENCOUNTER — Ambulatory Visit (HOSPITAL_BASED_OUTPATIENT_CLINIC_OR_DEPARTMENT_OTHER): Payer: BC Managed Care – PPO

## 2023-06-12 ENCOUNTER — Inpatient Hospital Stay: Payer: BC Managed Care – PPO

## 2023-06-12 ENCOUNTER — Inpatient Hospital Stay (HOSPITAL_BASED_OUTPATIENT_CLINIC_OR_DEPARTMENT_OTHER): Payer: BC Managed Care – PPO | Admitting: Hematology and Oncology

## 2023-06-12 VITALS — BP 130/72 | HR 69 | Temp 97.7°F | Resp 18 | Ht 66.0 in | Wt 140.3 lb

## 2023-06-12 DIAGNOSIS — Z170 Estrogen receptor positive status [ER+]: Secondary | ICD-10-CM

## 2023-06-12 DIAGNOSIS — C50411 Malignant neoplasm of upper-outer quadrant of right female breast: Secondary | ICD-10-CM

## 2023-06-12 DIAGNOSIS — Z95828 Presence of other vascular implants and grafts: Secondary | ICD-10-CM

## 2023-06-12 DIAGNOSIS — Z5112 Encounter for antineoplastic immunotherapy: Secondary | ICD-10-CM | POA: Diagnosis not present

## 2023-06-12 MED ORDER — SODIUM CHLORIDE 0.9 % IV SOLN
Freq: Once | INTRAVENOUS | Status: AC
Start: 2023-06-12 — End: 2023-06-12

## 2023-06-12 MED ORDER — SODIUM CHLORIDE 0.9% FLUSH
10.0000 mL | INTRAVENOUS | Status: DC | PRN
  Administered 2023-06-12: 10 mL

## 2023-06-12 MED ORDER — ACETAMINOPHEN 325 MG PO TABS
650.0000 mg | ORAL_TABLET | Freq: Once | ORAL | Status: AC
Start: 2023-06-12 — End: 2023-06-12
  Administered 2023-06-12: 650 mg via ORAL
  Filled 2023-06-12: qty 2

## 2023-06-12 MED ORDER — TRASTUZUMAB-ANNS CHEMO 150 MG IV SOLR
6.0000 mg/kg | Freq: Once | INTRAVENOUS | Status: AC
  Administered 2023-06-12: 420 mg via INTRAVENOUS
  Filled 2023-06-12: qty 20

## 2023-06-12 MED ORDER — HEPARIN SOD (PORK) LOCK FLUSH 100 UNIT/ML IV SOLN
500.0000 [IU] | Freq: Once | INTRAVENOUS | Status: AC | PRN
  Administered 2023-06-12: 500 [IU]

## 2023-06-12 MED ORDER — DIPHENHYDRAMINE HCL 25 MG PO CAPS
25.0000 mg | ORAL_CAPSULE | Freq: Once | ORAL | Status: AC
Start: 2023-06-12 — End: 2023-06-12
  Administered 2023-06-12: 25 mg via ORAL
  Filled 2023-06-12: qty 1

## 2023-06-12 NOTE — Progress Notes (Signed)
Patient Care Team: [name], [name], MD as PCP - General (Family Medicine) Pershing Proud, RN as Oncology Nurse Navigator Donnelly Angelica, RN as Oncology Nurse Navigator [name], MD as Consulting Physician (Hematology and Oncology) Almond Lint, MD as Consulting Physician (General Surgery) Lonie Peak, MD as Attending Physician (Radiation Oncology)  DIAGNOSIS:  Encounter Diagnosis  Name Primary?   Malignant neoplasm of upper-outer quadrant of right breast in female, estrogen receptor positive (HCC) Yes    SUMMARY OF ONCOLOGIC HISTORY: Oncology History  Malignant neoplasm of upper-outer quadrant of right breast in female, estrogen receptor positive (HCC)  05/26/2022 Mammogram   Diagnostic mammogram after patient noticed a palpable lump in the right breast showed suspicious mass at the palpable site of concern in the right breast at 10:00 measuring 1.2 cm. Ultrasound of the right breast showed no evidence of lymphadenopathy.   06/06/2022 Pathology Results   Right breast needle core biopsy at 10:00 showed overall grade 2 invasive ductal carcinoma.  Prognostic showed ER 100% positive strong staining PR 5% positive strong staining, Ki-67 of 50% HER2 3+ by [facility]   06/06/2022 Cancer Staging   Staging form: Breast, AJCC 8th Edition - Clinical stage from 06/06/2022: Stage IA (cT1c, cN0, cM0, G3, ER+, PR+, HER2+) - Signed by [name], MD on 07/19/2022 Stage prefix: Initial diagnosis Histologic grading system: 3 grade system   06/26/2022 Genetic Testing   Negative [facility] CancerNext Expanded Panel.  Report date is 06/26/2022.   The CancerNext-Expanded gene panel offered by [facility] and includes sequencing and rearrangement analysis for the following 77 genes: AIP, ALK, APC, ATM, AXIN2, BAP1, BARD1, BLM, BMPR1A, BRCA1, BRCA2, BRIP1, CDC73, CDH1, CDK4, CDKN1B, CDKN2A, CHEK2, CTNNA1, DICER1, FANCC, FH, FLCN, GALNT12, KIF1B, LZTR1, MAX, MEN1, MET, MLH1, MSH2, MSH3, MSH6, MUTYH,  NBN, NF1, NF2, NTHL1, PALB2, PHOX2B, PMS2, POT1, PRKAR1A, PTCH1, PTEN, RAD51C, RAD51D, RB1, RECQL, RET, SDHA, SDHAF2, SDHB, SDHC, SDHD, SMAD4, SMARCA4, SMARCB1, SMARCE1, STK11, SUFU, TMEM127, TP53, TSC1, TSC2, VHL and XRCC2 (sequencing and deletion/duplication); EGFR, EGLN1, HOXB13, KIT, MITF, PDGFRA, POLD1, and POLE (sequencing only); EPCAM and GREM1 (deletion/duplication only).    06/28/2022 Initial Diagnosis   Malignant neoplasm of upper-outer quadrant of right breast in female, estrogen receptor positive (HCC)   07/05/2022 Surgery   Right breast lumpectomy: 1.3 cm invasive poorly differentiated ductal carcinoma grade 3, margins negative, 2 sentinel lymph nodes negative   07/05/2022 Cancer Staging   Staging form: Breast, AJCC 8th Edition - Pathologic stage from 07/05/2022: Stage IA (pT1c, pN0, cM0, G3, ER+, PR+, HER2+) - Signed by [name], NP on 08/02/2022 Stage prefix: Initial diagnosis Histologic grading system: 3 grade system   08/02/2022 -  Chemotherapy   Patient is on Treatment Plan : BREAST Paclitaxel + Trastuzumab q7d / Trastuzumab q21d     11/16/2022 - 12/13/2022 Radiation Therapy   Plan Name: Breast_R Site: Breast, Right Technique: 3D Mode: Photon Dose Per Fraction: 2.67 Gy Prescribed Dose (Delivered / Prescribed): 40.05 Gy / 40.05 Gy Prescribed Fxs (Delivered / Prescribed): 15 / 15   Plan Name: Breast_R_Bst Site: Breast, Right Technique: 3D Mode: Photon Dose Per Fraction: 2 Gy Prescribed Dose (Delivered / Prescribed): 10 Gy / 10 Gy Prescribed Fxs (Delivered / Prescribed): 5 / 5   12/2022 -  Anti-estrogen oral therapy   Tamoxifen daily     CHIEF COMPLIANT: Follow-up on Herceptin and tamoxifen  HISTORY OF PRESENT ILLNESS: [name] is a 52 year old with history of HER2 positive breast cancer currently on adjuvant anti-HER2 therapy  with Herceptin.  She is also taking tamoxifen.  She does have intermittent loose stools especially because of recent antibiotic  usage.  Denies any nausea or vomiting.  We encouraged her to use probiotics.  She is tolerating tamoxifen fairly well.      ALLERGIES:  is allergic to latex.  MEDICATIONS:  Current Outpatient Medications  Medication Sig Dispense Refill   acetaminophen (TYLENOL) 500 MG tablet Take 500 mg by mouth every 8 (eight) hours as needed for mild pain (Neck Paim).     amLODipine (NORVASC) 5 MG tablet TAKE 1 TABLET (5 MG TOTAL) BY MOUTH DAILY. 90 tablet 3   amoxicillin-clavulanate (AUGMENTIN) 875-125 MG tablet Take 1 tablet by mouth 2 (two) times daily for 7 days. 14 tablet 0   Calcium-Phosphorus-Vitamin D (CITRACAL +D3 PO) Take by mouth.     doxycycline (VIBRA-TABS) 100 MG tablet Take 1 tablet (100 mg total) by mouth 2 (two) times daily for 7 days. 14 tablet 0   ferrous sulfate 325 (65 FE) MG tablet Take 325 mg by mouth daily with breakfast.     fluticasone (FLONASE) 50 MCG/ACT nasal spray Place 2 sprays into both nostrils daily. 16 g 2   levothyroxine (SYNTHROID) 50 MCG tablet Take 1 tablet (50 mcg total) by mouth daily. 45 tablet 1   lidocaine-prilocaine (EMLA) cream Apply to affected area once 30 g 3   tamoxifen (NOLVADEX) 10 MG tablet TAKE 1 TABLET BY MOUTH TWICE A DAY 60 tablet 0   valACYclovir (VALTREX) 500 MG tablet Take 500 mg by mouth as needed.     No current facility-administered medications for this visit.    PHYSICAL EXAMINATION: ECOG PERFORMANCE STATUS: 1 - Symptomatic but completely ambulatory  Vitals:   06/12/23 1121  BP: 130/72  Pulse: 69  Resp: 18  Temp: 97.7 F (36.5 C)  SpO2: 100%   Filed Weights   06/12/23 1121  Weight: 140 lb 4.8 oz (63.6 kg)      LABORATORY DATA:  I have reviewed the data as listed    Latest Ref Rng & Units 03/22/2023    1:27 PM 12/27/2022    4:06 PM 10/25/2022    9:18 AM  CMP  Glucose 70 - 99 mg/dL 161  85  096   BUN 6 - 20 mg/dL 14  17  24    Creatinine 0.44 - 1.00 mg/dL 0.45  4.09  8.11   Sodium 135 - 145 mmol/L 140  140  138   Potassium  3.5 - 5.1 mmol/L 3.6  3.7  3.6   Chloride 98 - 111 mmol/L 107  107  105   CO2 22 - 32 mmol/L 30  27  27    Calcium 8.9 - 10.3 mg/dL 9.8  9.7  9.0   Total Protein 6.5 - 8.1 g/dL 7.1  7.2  6.3   Total Bilirubin 0.3 - 1.2 mg/dL 0.3  0.4  0.6   Alkaline Phos 38 - 126 U/L 54  72  49   AST 15 - 41 U/L 16  18  15    ALT 0 - 44 U/L 12  15  19      Lab Results  Component Value Date   WBC 8.3 06/02/2023   HGB 10.8 (L) 06/02/2023   HCT 33.8 (L) 06/02/2023   MCV 87 06/02/2023   PLT 301 06/02/2023   NEUTROABS 1.7 03/22/2023    ASSESSMENT & PLAN:  Malignant neoplasm of upper-outer quadrant of right breast in female, estrogen receptor positive (HCC) Harmany is  a 52 year old woman with stage IA triple positive right breast cancer diagnosed in 05/2022 s/p lumpectomy, adjuvant chemotherapy, continuing on maintenance Herceptin, and Tamoxifen here for follow up.   Breast Cancer Patient is currently on Herceptin and Tamoxifen with no new problems reported. Ejection fraction has decreased slightly but is still within acceptable limits for continuation of Herceptin. -Continue Herceptin and Tamoxifen as prescribed.   Pruritus   Iron Deficiency Patient is taking iron every other day and hemoglobin levels are slowly improving. -Continue iron supplementation every other day.   She completes Herceptin in 3 weeks.  After that we will set her up for 51-month survivorship care plan visit with [facility].  No orders of the defined types were placed in this encounter.  The patient has a good understanding of the overall plan. she agrees with it. she will call with any problems that may develop before the next visit here. Total time spent: 30 mins including face to face time and time spent for planning, charting and co-ordination of care   Tamsen Meek, MD 06/12/23

## 2023-06-12 NOTE — Telephone Encounter (Signed)
I spoke with [facility], pt is scheduled for 1/13 at 11am. Patient is aware and aware to call them to reschedule if needed.

## 2023-06-12 NOTE — Assessment & Plan Note (Signed)
Dawn Bates is a 52 year old woman with stage IA triple positive right breast cancer diagnosed in 05/2022 s/p lumpectomy, adjuvant chemotherapy, continuing on maintenance Herceptin, and Tamoxifen here for follow up.   Breast Cancer Patient is currently on Herceptin and Tamoxifen with no new problems reported. Ejection fraction has decreased slightly but is still within acceptable limits for continuation of Herceptin. -Continue Herceptin and Tamoxifen as prescribed.   Pruritus   Iron Deficiency Patient is taking iron every other day and hemoglobin levels are slowly improving. -Continue iron supplementation every other day.

## 2023-06-12 NOTE — Patient Instructions (Signed)
 [facility] - A DEPT OF [facility]  Discharge Instructions: Thank you for choosing [facility] to provide your oncology and hematology care.   If you have a lab appointment with the Cancer Center, please go directly to the Cancer Center and check in at the registration area.   Wear comfortable clothing and clothing appropriate for easy access to any Portacath or PICC line.   We strive to give you quality time with your provider. You may need to reschedule your appointment if you arrive late (15 or more minutes).  Arriving late affects you and other patients whose appointments are after yours.  Also, if you miss three or more appointments without notifying the office, you may be dismissed from the clinic at the provider's discretion.      For prescription refill requests, have your pharmacy contact our office and allow 72 hours for refills to be completed.    Today you received the following chemotherapy and/or immunotherapy agents : Trastuzumab      To help prevent nausea and vomiting after your treatment, we encourage you to take your nausea medication as directed.  BELOW ARE SYMPTOMS THAT SHOULD BE REPORTED IMMEDIATELY: *FEVER GREATER THAN 100.4 F (38 C) OR HIGHER *CHILLS OR SWEATING *NAUSEA AND VOMITING THAT IS NOT CONTROLLED WITH YOUR NAUSEA MEDICATION *UNUSUAL SHORTNESS OF BREATH *UNUSUAL BRUISING OR BLEEDING *URINARY PROBLEMS (pain or burning when urinating, or frequent urination) *BOWEL PROBLEMS (unusual diarrhea, constipation, pain near the anus) TENDERNESS IN MOUTH AND THROAT WITH OR WITHOUT PRESENCE OF ULCERS (sore throat, sores in mouth, or a toothache) UNUSUAL RASH, SWELLING OR PAIN  UNUSUAL VAGINAL DISCHARGE OR ITCHING   Items with * indicate a potential emergency and should be followed up as soon as possible or go to the Emergency Department if any problems should occur.  Please show the CHEMOTHERAPY ALERT CARD or  IMMUNOTHERAPY ALERT CARD at check-in to the Emergency Department and triage nurse.  Should you have questions after your visit or need to cancel or reschedule your appointment, please contact [facility] - A DEPT OF [facility]  Dept: [phone]  and follow the prompts.  Office hours are 8:00 a.m. to 4:30 p.m. Monday - Friday. Please note that voicemails left after 4:00 p.m. may not be returned until the following business day.  We are closed weekends and major holidays. You have access to a nurse at all times for urgent questions. Please call the main number to the clinic Dept: [phone] and follow the prompts.   For any non-urgent questions, you may also contact your provider using MyChart. We now offer e-Visits for anyone 24 and older to request care online for non-urgent symptoms. For details visit [email or web address].   Also download the MyChart app! Go to the app store, search "MyChart", open the app, select [facility], and log in with your MyChart username and password.

## 2023-06-13 ENCOUNTER — Telehealth: Payer: Self-pay | Admitting: Adult Health

## 2023-07-03 ENCOUNTER — Ambulatory Visit
Admission: RE | Admit: 2023-07-03 | Discharge: 2023-07-03 | Disposition: A | Discharge status: Home / Self Care | Payer: BC Managed Care – PPO | Source: Ambulatory Visit | Attending: Family Medicine | Admitting: Family Medicine

## 2023-07-03 ENCOUNTER — Encounter: Payer: Self-pay | Admitting: *Deleted

## 2023-07-03 ENCOUNTER — Telehealth: Payer: Self-pay

## 2023-07-03 DIAGNOSIS — R413 Other amnesia: Secondary | ICD-10-CM

## 2023-07-03 DIAGNOSIS — Z170 Estrogen receptor positive status [ER+]: Secondary | ICD-10-CM

## 2023-07-03 NOTE — Telephone Encounter (Signed)
Appointment scheduled for tomorrow at 11am.

## 2023-07-03 NOTE — Telephone Encounter (Signed)
-----   Message from [facility] Chena Chohan A sent at 06/05/2023  9:07 AM EST ----- Chest x-ray in 4 weeks

## 2023-07-04 ENCOUNTER — Ambulatory Visit (INDEPENDENT_AMBULATORY_CARE_PROVIDER_SITE_OTHER): Payer: BC Managed Care – PPO

## 2023-07-04 ENCOUNTER — Other Ambulatory Visit: Payer: BC Managed Care – PPO

## 2023-07-04 ENCOUNTER — Inpatient Hospital Stay: Payer: BC Managed Care – PPO | Attending: Hematology and Oncology

## 2023-07-04 VITALS — BP 151/86 | HR 58 | Temp 98.5°F | Resp 14 | Wt 141.0 lb

## 2023-07-04 DIAGNOSIS — C50411 Malignant neoplasm of upper-outer quadrant of right female breast: Secondary | ICD-10-CM | POA: Diagnosis present

## 2023-07-04 DIAGNOSIS — Z5112 Encounter for antineoplastic immunotherapy: Secondary | ICD-10-CM | POA: Diagnosis present

## 2023-07-04 DIAGNOSIS — Z95828 Presence of other vascular implants and grafts: Secondary | ICD-10-CM

## 2023-07-04 DIAGNOSIS — Z170 Estrogen receptor positive status [ER+]: Secondary | ICD-10-CM | POA: Diagnosis not present

## 2023-07-04 DIAGNOSIS — J181 Lobar pneumonia, unspecified organism: Secondary | ICD-10-CM | POA: Diagnosis not present

## 2023-07-04 DIAGNOSIS — Z79620 Long term (current) use of immunosuppressive biologic: Secondary | ICD-10-CM | POA: Insufficient documentation

## 2023-07-04 MED ORDER — HEPARIN SOD (PORK) LOCK FLUSH 100 UNIT/ML IV SOLN
500.0000 [IU] | Freq: Once | INTRAVENOUS | Status: AC | PRN
  Administered 2023-07-04: 500 [IU]

## 2023-07-04 MED ORDER — DIPHENHYDRAMINE HCL 25 MG PO CAPS
25.0000 mg | ORAL_CAPSULE | Freq: Once | ORAL | Status: AC
Start: 2023-07-04 — End: 2023-07-04
  Administered 2023-07-04: 25 mg via ORAL
  Filled 2023-07-04: qty 1

## 2023-07-04 MED ORDER — ACETAMINOPHEN 325 MG PO TABS
650.0000 mg | ORAL_TABLET | Freq: Once | ORAL | Status: AC
Start: 2023-07-04 — End: 2023-07-04
  Administered 2023-07-04: 650 mg via ORAL
  Filled 2023-07-04: qty 2

## 2023-07-04 MED ORDER — SODIUM CHLORIDE 0.9 % IV SOLN
Freq: Once | INTRAVENOUS | Status: AC
Start: 2023-07-04 — End: 2023-07-04

## 2023-07-04 MED ORDER — TRASTUZUMAB-ANNS CHEMO 150 MG IV SOLR
6.0000 mg/kg | Freq: Once | INTRAVENOUS | Status: AC
  Administered 2023-07-04: 420 mg via INTRAVENOUS
  Filled 2023-07-04: qty 20

## 2023-07-04 MED ORDER — SODIUM CHLORIDE 0.9% FLUSH
10.0000 mL | INTRAVENOUS | Status: DC | PRN
  Administered 2023-07-04: 10 mL

## 2023-07-04 NOTE — Patient Instructions (Signed)

## 2023-07-06 ENCOUNTER — Encounter: Payer: Self-pay | Admitting: Hematology and Oncology

## 2023-07-06 ENCOUNTER — Other Ambulatory Visit: Payer: Self-pay | Admitting: Hematology and Oncology

## 2023-07-06 DIAGNOSIS — Z170 Estrogen receptor positive status [ER+]: Secondary | ICD-10-CM

## 2023-07-14 ENCOUNTER — Encounter: Payer: Self-pay | Admitting: Family Medicine

## 2023-07-17 ENCOUNTER — Encounter: Payer: Self-pay | Admitting: Family Medicine

## 2023-07-17 ENCOUNTER — Ambulatory Visit (HOSPITAL_COMMUNITY): Payer: BC Managed Care – PPO

## 2023-07-17 LAB — HM COLONOSCOPY

## 2023-07-21 ENCOUNTER — Ambulatory Visit (HOSPITAL_COMMUNITY)
Admission: RE | Admit: 2023-07-21 | Discharge: 2023-07-21 | Disposition: A | Discharge status: Home / Self Care | Payer: BC Managed Care – PPO | Source: Ambulatory Visit | Attending: Hematology and Oncology | Admitting: Hematology and Oncology

## 2023-07-21 DIAGNOSIS — E785 Hyperlipidemia, unspecified: Secondary | ICD-10-CM | POA: Diagnosis not present

## 2023-07-21 DIAGNOSIS — I10 Essential (primary) hypertension: Secondary | ICD-10-CM | POA: Diagnosis not present

## 2023-07-21 DIAGNOSIS — Z170 Estrogen receptor positive status [ER+]: Secondary | ICD-10-CM | POA: Insufficient documentation

## 2023-07-21 DIAGNOSIS — C50411 Malignant neoplasm of upper-outer quadrant of right female breast: Secondary | ICD-10-CM | POA: Diagnosis present

## 2023-07-21 DIAGNOSIS — Z0189 Encounter for other specified special examinations: Secondary | ICD-10-CM

## 2023-07-21 LAB — ECHOCARDIOGRAM COMPLETE
Area-P 1/2: 2.73 cm2
Calc EF: 59.5 %
S' Lateral: 2.9 cm
Single Plane A2C EF: 57.4 %
Single Plane A4C EF: 59.4 %

## 2023-07-21 NOTE — Progress Notes (Signed)
  Echocardiogram 2D Echocardiogram has been performed.  Dawn Bates 07/21/2023, 11:54 AM

## 2023-08-05 ENCOUNTER — Other Ambulatory Visit: Payer: Self-pay | Admitting: Hematology and Oncology

## 2023-08-05 DIAGNOSIS — Z170 Estrogen receptor positive status [ER+]: Secondary | ICD-10-CM

## 2023-08-07 ENCOUNTER — Encounter: Payer: Self-pay | Admitting: Hematology and Oncology

## 2023-08-11 ENCOUNTER — Ambulatory Visit
Admission: RE | Admit: 2023-08-11 | Discharge: 2023-08-11 | Disposition: A | Discharge status: Home / Self Care | Payer: BC Managed Care – PPO | Source: Ambulatory Visit | Attending: Family Medicine | Admitting: Family Medicine

## 2023-08-11 ENCOUNTER — Telehealth: Payer: BC Managed Care – PPO | Admitting: Family Medicine

## 2023-08-11 VITALS — Ht 66.0 in | Wt 135.0 lb

## 2023-08-11 DIAGNOSIS — R062 Wheezing: Secondary | ICD-10-CM | POA: Diagnosis not present

## 2023-08-11 DIAGNOSIS — J988 Other specified respiratory disorders: Secondary | ICD-10-CM | POA: Diagnosis not present

## 2023-08-11 DIAGNOSIS — R059 Cough, unspecified: Secondary | ICD-10-CM | POA: Diagnosis not present

## 2023-08-11 MED ORDER — DOXYCYCLINE HYCLATE 100 MG PO TABS: 100.0000 mg | ORAL_TABLET | Freq: Two times a day (BID) | ORAL | 0 refills | Status: AC

## 2023-08-11 MED ORDER — PREDNISONE 20 MG PO TABS: 40.0000 mg | ORAL_TABLET | Freq: Every day | ORAL | 0 refills | Status: AC

## 2023-08-11 MED ORDER — BENZONATATE 100 MG PO CAPS: 100.0000 mg | ORAL_CAPSULE | Freq: Two times a day (BID) | ORAL | 0 refills | Status: AC | PRN

## 2023-08-11 MED ORDER — ALBUTEROL SULFATE HFA 108 (90 BASE) MCG/ACT IN AERS: 2.0000 | INHALATION_SPRAY | Freq: Four times a day (QID) | RESPIRATORY_TRACT | 0 refills | Status: DC | PRN

## 2023-08-11 NOTE — Progress Notes (Addendum)
 Virtual Visit via Video Note I connected with Dawn Bates on 08/11/2023 by a video enabled telemedicine application and verified that I am speaking with the correct person using two identifiers. Location patient: home Location provider:work office Persons participating in the virtual visit: patient, provider, scribe  I discussed the limitations of evaluation and management by telemedicine and the availability of in person appointments. The patient expressed understanding and agreed to proceed.  Chief Complaint  Patient presents with   Cough    Pt reports productive cough- brown color phlegm. Cough has been going on for a little over a wk. Also has sore throat, bodyache, fatigue. Started off with headache. Now headache is subsided.  Denied fever. Chest pain comes and goes. Notice it middle of this wk. Pt states might be from the cough. Slightly SOB. Pt reports not taking medication. "Just trying to keep hydrated."   HPI: Dawn Bates is a 53 y.o. female with a PMHx significant for vitamin D deficiency, HLD, HTN, postablative hypothyroidism, breast cancer s/p chemo and radiation therapy, and iron deficiency anemia, who is being seen on video today for respiratory symptoms as described above.   Productive cough, body aches, fatigue, chills, sore throat, occasional SOB at rest, and nausea for more than a week.  Negative for hemoptysis. Chest wall pain with cough.  She also initially had some headache, but that has since resolved.  Also endorses some wheezing since the middle of the week.  No hx of asthma. She last had wheezing last night. Has not used an inhaler before.  She reports she has had several sick contacts at home with similar symptoms.  She has been taking cough drops and trying to hydrate well, but is not taking anything else OTC.  She has not checked for Covid.   She is concerned about having pneumonia because she is having similar symptoms she has in 05/2023,  when she was treated for possible CAP. CXR 06/02/23:Minimal left basilar consolidation or volume loss and small left-sided effusion. CXR 07/05/23:  1. No active cardiopulmonary disease. 2. Previously described retrocardiac left lung base opacities are no longer visualized.  Pertinent negatives include fever, rhinorrhea, nasal congestion,exertional CP, abdominal pain, vomiting, diarrhea, or urinary symptoms.  ROS: See pertinent positives and negatives per HPI.  Past Medical History:  Diagnosis Date   Abnormal uterine bleeding 2022   Anemia 02/12/2021   Patient is taking iron supplementation.   Cancer [facility])    breast   COVID-19 2020   cold-like symptoms   Endometriosis    Fibroids    Graves disease    Hypertension    Hyperthyroidism 2022   s/p Nuclear Medicine RAI therapy on 08/07/20   Personal history of chemotherapy    Personal history of radiation therapy    Seasonal allergies    severe chronic allergies, runny nose, congestion, cough when she lays down due to drainage   Vitamin D deficiency 2021   Wears glasses     Past Surgical History:  Procedure Laterality Date   BREAST BIOPSY Right 06/06/2022   [place] RT BREAST BX W LOC DEV 1ST LESION IMG BX SPEC US GUIDE 06/06/2022 [facility]   BREAST LUMPECTOMY     BREAST LUMPECTOMY WITH SENTINEL LYMPH NODE BIOPSY Right 07/05/2022   Procedure: RIGHT BREAST LUMPECTOMY WITH SENTINEL LYMPH NODE BX;  Surgeon: Almond Lint, MD;  Location: [facility];  Service: General;  Laterality: Right;   CYSTOSCOPY  10/04/2021   Procedure: CYSTOSCOPY;  Surgeon: Normand Sloop,  Samule Ohm, MD;  Location: [facility];  Service: Gynecology;;   LAPAROSCOPIC VAGINAL HYSTERECTOMY WITH SALPINGECTOMY N/A 10/04/2021   Procedure: LAPAROSCOPIC ASSISTED VAGINAL HYSTERECTOMY WITH SALPINGECTOMY;  Surgeon: Jaymes Graff, MD;  Location: [facility];  Service: Gynecology;  Laterality: N/A;   PORTACATH PLACEMENT Left 07/05/2022    Procedure: INSERTION PORT-A-CATH;  Surgeon: Almond Lint, MD;  Location: [facility];  Service: General;  Laterality: Left;   TUBAL LIGATION  09/2008    Family History  Problem Relation Age of Onset   Breast cancer Sister 72       triple negative; d. early 13s   Cancer Paternal Aunt        unknown type   Thyroid cancer Half-Sister        mat half sister; dx 63s; unknown cell type    Social History   Socioeconomic History   Marital status: Married    Spouse name: Not on file   Number of children: Not on file   Years of education: Not on file   Highest education level: Not on file  Occupational History   Not on file  Tobacco Use   Smoking status: Never   Smokeless tobacco: Never  Vaping Use   Vaping status: Never Used  Substance and Sexual Activity   Alcohol use: No   Drug use: No   Sexual activity: Yes    Partners: Male    Birth control/protection: Surgical    Comment: BTL   Other Topics Concern   Not on file  Social History Narrative   Not on file   Social Drivers of Health   Financial Resource Strain: Not on file  Food Insecurity: No Food Insecurity (01/12/2023)   Hunger Vital Sign    Worried About Running Out of Food in the Last Year: Never true    Ran Out of Food in the Last Year: Never true  Transportation Needs: No Transportation Needs (01/12/2023)   PRAPARE - Administrator, Civil Service (Medical): No    Lack of Transportation (Non-Medical): No  Physical Activity: Not on file  Stress: Not on file  Social Connections: Not on file  Intimate Partner Violence: Not At Risk (01/12/2023)   Humiliation, Afraid, Rape, and Kick questionnaire    Fear of Current or Ex-Partner: No    Emotionally Abused: No    Physically Abused: No    Sexually Abused: No     Current Outpatient Medications:    albuterol (VENTOLIN HFA) 108 (90 Base) MCG/ACT inhaler, Inhale 2 puffs into the lungs every 6 (six) hours as needed for wheezing or shortness of  breath., Disp: 8 g, Rfl: 0   amLODipine (NORVASC) 5 MG tablet, TAKE 1 TABLET (5 MG TOTAL) BY MOUTH DAILY., Disp: 90 tablet, Rfl: 3   benzonatate (TESSALON) 100 MG capsule, Take 1 capsule (100 mg total) by mouth 2 (two) times daily as needed for up to 10 days., Disp: 20 capsule, Rfl: 0   doxycycline (VIBRA-TABS) 100 MG tablet, Take 1 tablet (100 mg total) by mouth 2 (two) times daily for 7 days., Disp: 14 tablet, Rfl: 0   ferrous sulfate 325 (65 FE) MG tablet, Take 325 mg by mouth every other day., Disp: , Rfl:    levothyroxine (SYNTHROID) 50 MCG tablet, Take 1 tablet (50 mcg total) by mouth daily., Disp: 45 tablet, Rfl: 1   predniSONE (DELTASONE) 20 MG tablet, Take 2 tablets (40 mg total) by mouth daily with breakfast for 3 days.,  Disp: 6 tablet, Rfl: 0   tamoxifen (NOLVADEX) 10 MG tablet, TAKE 1 TABLET BY MOUTH TWICE A DAY, Disp: 60 tablet, Rfl: 0   valACYclovir (VALTREX) 500 MG tablet, Take 500 mg by mouth as needed., Disp: , Rfl:    acetaminophen (TYLENOL) 500 MG tablet, Take 500 mg by mouth every 8 (eight) hours as needed for mild pain (Neck Paim). (Patient not taking: Reported on 08/11/2023), Disp: , Rfl:    Calcium-Phosphorus-Vitamin D (CITRACAL +D3 PO), Take by mouth. (Patient not taking: Reported on 08/11/2023), Disp: , Rfl:    fluticasone (FLONASE) 50 MCG/ACT nasal spray, Place 2 sprays into both nostrils daily. (Patient not taking: Reported on 08/11/2023), Disp: 16 g, Rfl: 2   lidocaine-prilocaine (EMLA) cream, Apply to affected area once (Patient not taking: Reported on 08/11/2023), Disp: 30 g, Rfl: 3  EXAM:  VITALS per patient if applicable:Ht 5\' 6"  (1.676 m)   Wt 135 lb (61.2 kg)   LMP 09/09/2021 (Exact Date)   BMI 21.79 kg/m   GENERAL: alert, oriented, appears well and in no acute distress  HEENT: atraumatic, conjunctiva clear, no obvious abnormalities on inspection of external nose and ears Nasal congestion.  NECK: normal movements of the head and neck  LUNGS: on inspection no  signs of respiratory distress, breathing rate appears normal, no obvious gross SOB, gasping or wheezing. Productive cough a few times during visit.  CV: no obvious cyanosis  MS: moves all visible extremities without noticeable abnormality  PSYCH/NEURO: pleasant and cooperative, no obvious depression or anxiety, speech and thought processing grossly intact  ASSESSMENT AND PLAN:  Discussed the following assessment and plan:  Wheezing - Plan: DG Chest 2 View, predniSONE (DELTASONE) 20 MG tablet, albuterol (VENTOLIN HFA) 108 (90 Base) MCG/ACT inhaler Recommend short course of Prednisone 40 mg c 3 days, take it with breakfast. Albuterol inh 2 puff every 6 hours for a week then as needed for wheezing or shortness of breath.  Instructed about warning signs.  Respiratory tract infection - Plan: doxycycline (VIBRA-TABS) 100 MG tablet She has been having symptoms for 7-10 days and concerned about possible pneumonia. Explained symptoms could be caused by a viral illness. Recommend Doxycycline 100 mg bid x 7 d. Further recommendations according to CXR result.  Cough, unspecified type - Plan: benzonatate (TESSALON) 100 MG capsule Symptomatic treatment with Benzonatate  and plain Mucinex recommended. CXR to be done at [facility] radiology today.  We discussed possible serious and likely etiologies, options for evaluation and workup, limitations of telemedicine visit vs in person visit, treatment, treatment risks and precautions. The patient was advised to call back or seek an in-person evaluation if the symptoms worsen or if the condition fails to improve as anticipated. I discussed the assessment and treatment plan with the patient. The patient was provided an opportunity to ask questions and all were answered. The patient agreed with the plan and demonstrated an understanding of the instructions.  Return if symptoms worsen or fail to improve.  I, Rolla Etienne Wierda, acting as a scribe for Katsumi Wisler  [name], MD., have documented all relevant documentation on the behalf of Delailah Spieth [name], MD, as directed by  Roux Brandy [name], MD while in the presence of Daneil Beem [name], MD.   I, Jatavius Ellenwood [name], MD, have reviewed all documentation for this visit. The documentation on 08/14/23 for the exam, diagnosis, procedures, and orders are all accurate and complete.  Elian Gloster [name], MD

## 2023-08-14 ENCOUNTER — Telehealth: Payer: Self-pay

## 2023-08-14 NOTE — Telephone Encounter (Signed)
 Can you go in and refresh your note from this visit? It should take the anorexia out.

## 2023-08-14 NOTE — Telephone Encounter (Signed)
 Copied from CRM [id number]. Topic: General - Other >> Aug 14, 2023 11:07 AM Truddie Crumble wrote: Reason for CRM: patient called stating she had a virtual appointment on 2/21 for a sore throat, cough and fatigue. Patient states under chief complaint that it says anorexia for loss of appetite and she wants to know why that is up there. Patient sates she wants that removed it removed. Patient would like a call back

## 2023-08-15 ENCOUNTER — Encounter: Payer: Self-pay | Admitting: Family Medicine

## 2023-08-25 ENCOUNTER — Telehealth: Payer: Self-pay | Admitting: *Deleted

## 2023-08-25 NOTE — Telephone Encounter (Signed)
 Pt called to this RN to asked about obtain ok for port to be removed.  Per review - pt has completed all IV therapy for her history of breast cancer.  This note will be forwarded to Dr Donell Beers for scheduling of port removal.

## 2023-08-28 ENCOUNTER — Other Ambulatory Visit: Payer: Self-pay | Admitting: Hematology and Oncology

## 2023-08-29 ENCOUNTER — Other Ambulatory Visit: Payer: Self-pay | Admitting: General Surgery

## 2023-09-02 ENCOUNTER — Other Ambulatory Visit: Payer: Self-pay | Admitting: Family Medicine

## 2023-09-02 DIAGNOSIS — R062 Wheezing: Secondary | ICD-10-CM

## 2023-09-04 ENCOUNTER — Encounter (HOSPITAL_BASED_OUTPATIENT_CLINIC_OR_DEPARTMENT_OTHER): Payer: Self-pay | Admitting: General Surgery

## 2023-09-04 ENCOUNTER — Other Ambulatory Visit: Payer: Self-pay | Admitting: Hematology and Oncology

## 2023-09-04 DIAGNOSIS — C50411 Malignant neoplasm of upper-outer quadrant of right female breast: Secondary | ICD-10-CM

## 2023-09-08 ENCOUNTER — Encounter (HOSPITAL_BASED_OUTPATIENT_CLINIC_OR_DEPARTMENT_OTHER)
Admission: RE | Admit: 2023-09-08 | Discharge: 2023-09-08 | Disposition: A | Discharge status: Home / Self Care | Source: Ambulatory Visit | Attending: General Surgery | Admitting: General Surgery

## 2023-09-08 DIAGNOSIS — Z01810 Encounter for preprocedural cardiovascular examination: Secondary | ICD-10-CM | POA: Insufficient documentation

## 2023-09-08 MED ORDER — CHLORHEXIDINE GLUCONATE CLOTH 2 % EX PADS: 6.0000 | MEDICATED_PAD | Freq: Once | CUTANEOUS | Status: DC

## 2023-09-08 NOTE — Progress Notes (Signed)

## 2023-09-11 ENCOUNTER — Encounter: Payer: BC Managed Care – PPO | Admitting: Adult Health

## 2023-09-12 ENCOUNTER — Other Ambulatory Visit: Payer: Self-pay

## 2023-09-12 ENCOUNTER — Ambulatory Visit (HOSPITAL_BASED_OUTPATIENT_CLINIC_OR_DEPARTMENT_OTHER): Admitting: Anesthesiology

## 2023-09-12 ENCOUNTER — Ambulatory Visit (HOSPITAL_BASED_OUTPATIENT_CLINIC_OR_DEPARTMENT_OTHER)
Admission: RE | Admit: 2023-09-12 | Discharge: 2023-09-12 | Disposition: A | Discharge status: Home / Self Care | Source: Ambulatory Visit | Attending: General Surgery | Admitting: General Surgery

## 2023-09-12 ENCOUNTER — Encounter (HOSPITAL_BASED_OUTPATIENT_CLINIC_OR_DEPARTMENT_OTHER)
Admission: RE | Disposition: A | Discharge status: Home / Self Care | Payer: Self-pay | Source: Ambulatory Visit | Attending: General Surgery

## 2023-09-12 ENCOUNTER — Encounter (HOSPITAL_BASED_OUTPATIENT_CLINIC_OR_DEPARTMENT_OTHER): Payer: Self-pay | Admitting: General Surgery

## 2023-09-12 DIAGNOSIS — Z803 Family history of malignant neoplasm of breast: Secondary | ICD-10-CM | POA: Diagnosis not present

## 2023-09-12 DIAGNOSIS — I10 Essential (primary) hypertension: Secondary | ICD-10-CM | POA: Diagnosis not present

## 2023-09-12 DIAGNOSIS — Z9221 Personal history of antineoplastic chemotherapy: Secondary | ICD-10-CM | POA: Diagnosis not present

## 2023-09-12 DIAGNOSIS — Z853 Personal history of malignant neoplasm of breast: Secondary | ICD-10-CM | POA: Insufficient documentation

## 2023-09-12 DIAGNOSIS — Z452 Encounter for adjustment and management of vascular access device: Secondary | ICD-10-CM | POA: Diagnosis present

## 2023-09-12 DIAGNOSIS — Z79899 Other long term (current) drug therapy: Secondary | ICD-10-CM | POA: Insufficient documentation

## 2023-09-12 DIAGNOSIS — D649 Anemia, unspecified: Secondary | ICD-10-CM | POA: Insufficient documentation

## 2023-09-12 DIAGNOSIS — Z01818 Encounter for other preprocedural examination: Secondary | ICD-10-CM

## 2023-09-12 DIAGNOSIS — E039 Hypothyroidism, unspecified: Secondary | ICD-10-CM | POA: Insufficient documentation

## 2023-09-12 DIAGNOSIS — Z79890 Hormone replacement therapy (postmenopausal): Secondary | ICD-10-CM | POA: Insufficient documentation

## 2023-09-12 HISTORY — PX: PORT-A-CATH REMOVAL: SHX5289

## 2023-09-12 SURGERY — REMOVAL PORT-A-CATH
Anesthesia: Monitor Anesthesia Care | Site: Chest

## 2023-09-12 MED ORDER — LIDOCAINE-EPINEPHRINE (PF) 1 %-1:200000 IJ SOLN
INTRAMUSCULAR | Status: DC | PRN
  Administered 2023-09-12: 10 mL

## 2023-09-12 MED ORDER — MIDAZOLAM HCL 2 MG/2ML IJ SOLN
INTRAMUSCULAR | Status: AC
  Filled 2023-09-12: qty 2

## 2023-09-12 MED ORDER — FENTANYL CITRATE (PF) 100 MCG/2ML IJ SOLN: 25.0000 ug | INTRAMUSCULAR | Status: DC | PRN

## 2023-09-12 MED ORDER — FENTANYL CITRATE (PF) 100 MCG/2ML IJ SOLN
INTRAMUSCULAR | Status: AC
  Filled 2023-09-12: qty 2

## 2023-09-12 MED ORDER — OXYCODONE HCL 5 MG/5ML PO SOLN: 5.0000 mg | Freq: Once | ORAL | Status: DC | PRN

## 2023-09-12 MED ORDER — ACETAMINOPHEN 500 MG PO TABS
ORAL_TABLET | ORAL | Status: AC
  Filled 2023-09-12: qty 2

## 2023-09-12 MED ORDER — OXYCODONE HCL 5 MG PO TABS
5.0000 mg | ORAL_TABLET | Freq: Four times a day (QID) | ORAL | 0 refills | Status: DC | PRN
Start: 2023-09-12 — End: 2024-04-08

## 2023-09-12 MED ORDER — ACETAMINOPHEN 500 MG PO TABS: 1000.0000 mg | ORAL_TABLET | Freq: Once | ORAL | Status: DC | PRN

## 2023-09-12 MED ORDER — CEFAZOLIN SODIUM-DEXTROSE 2-4 GM/100ML-% IV SOLN
2.0000 g | INTRAVENOUS | Status: AC
Start: 2023-09-13 — End: 2023-09-12
  Administered 2023-09-12: 2 g via INTRAVENOUS

## 2023-09-12 MED ORDER — ACETAMINOPHEN 10 MG/ML IV SOLN: 1000.0000 mg | Freq: Once | INTRAVENOUS | Status: DC | PRN

## 2023-09-12 MED ORDER — FENTANYL CITRATE (PF) 100 MCG/2ML IJ SOLN
INTRAMUSCULAR | Status: DC | PRN
  Administered 2023-09-12: 50 ug via INTRAVENOUS

## 2023-09-12 MED ORDER — OXYCODONE HCL 5 MG PO TABS: 5.0000 mg | ORAL_TABLET | Freq: Once | ORAL | Status: DC | PRN

## 2023-09-12 MED ORDER — ACETAMINOPHEN 160 MG/5ML PO SOLN: 1000.0000 mg | Freq: Once | ORAL | Status: DC | PRN

## 2023-09-12 MED ORDER — MIDAZOLAM HCL 2 MG/2ML IJ SOLN
INTRAMUSCULAR | Status: DC | PRN
  Administered 2023-09-12 (×2): 1 mg via INTRAVENOUS

## 2023-09-12 MED ORDER — PROPOFOL 10 MG/ML IV BOLUS
INTRAVENOUS | Status: DC | PRN
  Administered 2023-09-12: 100 ug/kg/min via INTRAVENOUS
  Administered 2023-09-12: 50 mg via INTRAVENOUS

## 2023-09-12 MED ORDER — CEFAZOLIN SODIUM-DEXTROSE 2-4 GM/100ML-% IV SOLN
INTRAVENOUS | Status: AC
Start: 2023-09-12 — End: ?
  Filled 2023-09-12: qty 100

## 2023-09-12 MED ORDER — ACETAMINOPHEN 500 MG PO TABS
1000.0000 mg | ORAL_TABLET | ORAL | Status: AC
Start: 2023-09-13 — End: 2023-09-12
  Administered 2023-09-12: 1000 mg via ORAL

## 2023-09-12 MED ORDER — LIDOCAINE-EPINEPHRINE (PF) 1 %-1:200000 IJ SOLN
INTRAMUSCULAR | Status: AC
  Filled 2023-09-12: qty 30

## 2023-09-12 MED ORDER — LACTATED RINGERS IV SOLN: INTRAVENOUS | Status: DC

## 2023-09-12 MED ORDER — BUPIVACAINE HCL (PF) 0.25 % IJ SOLN
INTRAMUSCULAR | Status: AC
  Filled 2023-09-12: qty 30

## 2023-09-12 SURGICAL SUPPLY — 28 items
BLADE HEX COATED 2.75 (ELECTRODE) ×1 IMPLANT
BLADE SURG 15 STRL LF DISP TIS (BLADE) ×1 IMPLANT
CANISTER SUCT 1200ML W/VALVE (MISCELLANEOUS) IMPLANT
CHLORAPREP W/TINT 26 (MISCELLANEOUS) ×1 IMPLANT
COVER BACK TABLE 60X90IN (DRAPES) ×1 IMPLANT
COVER MAYO STAND STRL (DRAPES) ×1 IMPLANT
DERMABOND ADVANCED .7 DNX12 (GAUZE/BANDAGES/DRESSINGS) ×1 IMPLANT
DRAPE LAPAROTOMY 100X72 PEDS (DRAPES) ×1 IMPLANT
DRAPE UTILITY XL STRL (DRAPES) ×1 IMPLANT
ELECT REM PT RETURN 9FT ADLT (ELECTROSURGICAL) ×1 IMPLANT
ELECTRODE REM PT RTRN 9FT ADLT (ELECTROSURGICAL) ×1 IMPLANT
GLOVE BIO SURGEON STRL SZ 6 (GLOVE) ×1 IMPLANT
GLOVE BIOGEL PI IND STRL 6.5 (GLOVE) ×1 IMPLANT
GLOVE SURG SS PI 6.0 STRL IVOR (GLOVE) IMPLANT
GOWN STRL REUS W/ TWL LRG LVL3 (GOWN DISPOSABLE) ×1 IMPLANT
GOWN STRL REUS W/ TWL XL LVL3 (GOWN DISPOSABLE) ×1 IMPLANT
NDL HYPO 25X1 1.5 SAFETY (NEEDLE) ×1 IMPLANT
NEEDLE HYPO 25X1 1.5 SAFETY (NEEDLE) ×1 IMPLANT
NS IRRIG 1000ML POUR BTL (IV SOLUTION) IMPLANT
PACK BASIN DAY SURGERY FS (CUSTOM PROCEDURE TRAY) ×1 IMPLANT
PENCIL SMOKE EVACUATOR (MISCELLANEOUS) ×1 IMPLANT
SPIKE FLUID TRANSFER (MISCELLANEOUS) IMPLANT
SUT MNCRL AB 4-0 PS2 18 (SUTURE) ×1 IMPLANT
SUT VIC AB 3-0 SH 27X BRD (SUTURE) ×1 IMPLANT
SYR CONTROL 10ML LL (SYRINGE) ×1 IMPLANT
TOWEL GREEN STERILE FF (TOWEL DISPOSABLE) ×1 IMPLANT
TUBE CONNECTING 20X1/4 (TUBING) IMPLANT
YANKAUER SUCT BULB TIP NO VENT (SUCTIONS) IMPLANT

## 2023-09-12 NOTE — Anesthesia Preprocedure Evaluation (Signed)
 Anesthesia Evaluation  Patient identified by MRN, date of birth, ID band Patient awake    Reviewed: Allergy & Precautions, NPO status , Patient's Chart, lab work & pertinent test results  History of Anesthesia Complications Negative for: history of anesthetic complications  Airway Mallampati: I  TM Distance: >3 FB Neck ROM: Full    Dental  (+) Teeth Intact, Dental Advisory Given   Pulmonary neg shortness of breath, neg sleep apnea, neg COPD, neg recent URI   breath sounds clear to auscultation       Cardiovascular hypertension, Pt. on medications (-) angina (-) Past MI and (-) CHF  Rhythm:Regular     Neuro/Psych neg Seizures  negative psych ROS   GI/Hepatic negative GI ROS, Neg liver ROS,,,  Endo/Other  Hypothyroidism    Renal/GU negative Renal ROS     Musculoskeletal   Abdominal   Peds  Hematology  (+) Blood dyscrasia, anemia Lab Results      Component                Value               Date                      WBC                      8.3                 06/02/2023                HGB                      10.8 (L)            06/02/2023                HCT                      33.8 (L)            06/02/2023                MCV                      87                  06/02/2023                PLT                      301                 06/02/2023              Anesthesia Other Findings   Reproductive/Obstetrics                             Anesthesia Physical Anesthesia Plan  ASA: 2  Anesthesia Plan: MAC   Post-op Pain Management: Tylenol PO (pre-op)*   Induction: Intravenous  PONV Risk Score and Plan: 2 and Propofol infusion, Ondansetron and Treatment may vary due to age or medical condition  Airway Management Planned: Nasal Cannula, Natural Airway and Simple Face Mask  Additional Equipment: None  Intra-op Plan:   Post-operative Plan:   Informed Consent: I have reviewed the  patients History and Physical, chart, labs  and discussed the procedure including the risks, benefits and alternatives for the proposed anesthesia with the patient or authorized representative who has indicated his/her understanding and acceptance.     Dental advisory given  Plan Discussed with: CRNA  Anesthesia Plan Comments:        Anesthesia Quick Evaluation

## 2023-09-12 NOTE — Discharge Instructions (Addendum)
 [facility] Office Phone Number [phone]   POST OP INSTRUCTIONS  Always review your discharge instruction sheet given to you by the facility where your surgery was performed.  IF YOU HAVE DISABILITY OR FAMILY LEAVE FORMS, YOU MUST BRING THEM TO THE OFFICE FOR PROCESSING.  DO NOT GIVE THEM TO YOUR DOCTOR.  Take 2 tylenol (acetominophen) three times a day for 3 days.  Please take next dose at 10:30 tonight.  If you still have pain, add ibuprofen with food in between if able to take this (if you have kidney issues or stomach issues, do not take ibuprofen).  Take whenever desired. If both of those are not enough, add the narcotic pain pill.  If you find you are needing a lot of this overnight after surgery, call the next morning for a refill.   Take your usually prescribed medications unless otherwise directed If you need a refill on your pain medication, please contact your pharmacy.  They will contact our office to request authorization.  Prescriptions will not be filled after 5pm or on week-ends. You should eat very light the first 24 hours after surgery, such as soup, crackers, pudding, etc.  Resume your normal diet the day after surgery It is common to experience some constipation if taking pain medication after surgery.  Increasing fluid intake and taking a stool softener will usually help or prevent this problem from occurring.  A mild laxative (Milk of Magnesia or Miralax) should be taken according to package directions if there are no bowel movements after 48 hours. You may shower in 48 hours.  The surgical glue will flake off in 2-3 weeks.   ACTIVITIES:  No strenuous activity or heavy lifting for 1 week.   You may drive when you no longer are taking prescription pain medication, you can comfortably wear a seatbelt, and you can safely maneuver your car and apply brakes. RETURN TO WORK:  __________2-7 days depending on lifting_______________ You should see your doctor in  the office for a follow-up appointment approximately three-four weeks after your surgery.    WHEN TO CALL YOUR DOCTOR: Fever over 101.0 Nausea and/or vomiting. Extreme swelling or bruising. Continued bleeding from incision. Increased pain, redness, or drainage from the incision.  The clinic staff is available to answer your questions during regular business hours.  Please don't hesitate to call and ask to speak to one of the nurses for clinical concerns.  If you have a medical emergency, go to the nearest emergency room or call 911.  A surgeon from [facility] is always on call at the hospital.  For further questions, please visit [email or web address]    Post Anesthesia Home Care Instructions  Activity: Get plenty of rest for the remainder of the day. A responsible individual must stay with you for 24 hours following the procedure.  For the next 24 hours, DO NOT: -Drive a car -Advertising copywriter -Drink alcoholic beverages -Take any medication unless instructed by your physician -Make any legal decisions or sign important papers.  Meals: Start with liquid foods such as gelatin or soup. Progress to regular foods as tolerated. Avoid greasy, spicy, heavy foods. If nausea and/or vomiting occur, drink only clear liquids until the nausea and/or vomiting subsides. Call your physician if vomiting continues.  Special Instructions/Symptoms: Your throat may feel dry or sore from the anesthesia or the breathing tube placed in your throat during surgery. If this causes discomfort, gargle with warm salt water. The discomfort should disappear within 24 hours.  If you had a scopolamine patch placed behind your ear for the management of post- operative nausea and/or vomiting:  1. The medication in the patch is effective for 72 hours, after which it should be removed.  Wrap patch in a tissue and discard in the trash. Wash hands thoroughly with soap and water. 2. You may remove the  patch earlier than 72 hours if you experience unpleasant side effects which may include dry mouth, dizziness or visual disturbances. 3. Avoid touching the patch. Wash your hands with soap and water after contact with the patch.

## 2023-09-12 NOTE — Anesthesia Procedure Notes (Signed)
 Procedure Name: MAC Date/Time: 09/12/2023 3:00 PM  Performed by: Francie Massing, CRNAPre-anesthesia Checklist: Patient identified, Emergency Drugs available, Suction available and Patient being monitored

## 2023-09-12 NOTE — Op Note (Signed)
  PRE-OPERATIVE DIAGNOSIS:  un-needed Port-A-Cath for right breast cancer  POST-OPERATIVE DIAGNOSIS:  Same   PROCEDURE:  Procedure(s):  REMOVAL PORT-A-CATH  SURGEON:  Surgeon(s):  Almond Lint, MD  ANESTHESIA:   MAC + local  EBL:   Minimal  SPECIMEN:  None  Complications : none known  Procedure:   Pt was  identified in the holding area and taken to the operating room where she was placed supine on the operating room table.  MAC anesthesia was induced.  The left upper chest was prepped and draped.  The prior incision was anesthetized with local anesthetic.  The incision was opened with a #15 blade.  The subcutaneous tissue was divided with the cautery.  The port was identified and the capsule opened.  The four 2-0 prolene sutures were removed.  The port was then removed and pressure held on the tract.  The catheter appeared intact without evidence of breakage, length was 22.5 cm.  The wound was inspected for hemostasis, which was achieved with cautery.  The wound was closed with 3-0 vicryl deep dermal interrupted sutures and 4-0 Monocryl running subcuticular suture.  The wound was cleaned, dried, and dressed with dermabond.  The patient was awakened from anesthesia and taken to the PACU in stable condition.  Needle, sponge, and instrument counts are correct.

## 2023-09-12 NOTE — H&P (Signed)
 Dawn Bates is an 53 y.o. female.   Chief Complaint: port in place HPI:  Patient presents following adjuvant chemotherapy for right breast cancer.  She has no clinical evidence of disease and desires port removal.    Past Medical History:  Diagnosis Date   Abnormal uterine bleeding 2022   Anemia 02/12/2021   Patient is taking iron supplementation.   Cancer [facility])    breast   COVID-19 2020   cold-like symptoms   Endometriosis    Fibroids    Graves disease    Hypertension    Hyperthyroidism 2022   s/p Nuclear Medicine RAI therapy on 08/07/20   Personal history of chemotherapy    Personal history of radiation therapy    Seasonal allergies    severe chronic allergies, runny nose, congestion, cough when she lays down due to drainage   Vitamin D deficiency 2021   Wears glasses     Past Surgical History:  Procedure Laterality Date   BREAST BIOPSY Right 06/06/2022   [place] RT BREAST BX W LOC DEV 1ST LESION IMG BX SPEC US GUIDE 06/06/2022 [facility]   BREAST LUMPECTOMY     BREAST LUMPECTOMY WITH SENTINEL LYMPH NODE BIOPSY Right 07/05/2022   Procedure: RIGHT BREAST LUMPECTOMY WITH SENTINEL LYMPH NODE BX;  Surgeon: Almond Lint, MD;  Location: [facility];  Service: General;  Laterality: Right;   CYSTOSCOPY  10/04/2021   Procedure: CYSTOSCOPY;  Surgeon: Jaymes Graff, MD;  Location: [facility];  Service: Gynecology;;   LAPAROSCOPIC VAGINAL HYSTERECTOMY WITH SALPINGECTOMY N/A 10/04/2021   Procedure: LAPAROSCOPIC ASSISTED VAGINAL HYSTERECTOMY WITH SALPINGECTOMY;  Surgeon: Jaymes Graff, MD;  Location: [facility];  Service: Gynecology;  Laterality: N/A;   PORTACATH PLACEMENT Left 07/05/2022   Procedure: INSERTION PORT-A-CATH;  Surgeon: Almond Lint, MD;  Location: [facility];  Service: General;  Laterality: Left;   TUBAL LIGATION  09/2008    Family History  Problem Relation Age of Onset   Breast cancer Sister  50       triple negative; d. early 51s   Cancer Paternal Aunt        unknown type   Thyroid cancer Half-Sister        mat half sister; dx 50s; unknown cell type   Social History:  reports that she has never smoked. She has never used smokeless tobacco. She reports that she does not drink alcohol and does not use drugs.  Allergies:  Allergies  Allergen Reactions   Latex Itching    Medications Prior to Admission  Medication Sig Dispense Refill   albuterol (VENTOLIN HFA) 108 (90 Base) MCG/ACT inhaler TAKE 2 PUFFS BY MOUTH EVERY 6 HOURS AS NEEDED FOR WHEEZE OR SHORTNESS OF BREATH 8.5 each 0   amLODipine (NORVASC) 5 MG tablet TAKE 1 TABLET (5 MG TOTAL) BY MOUTH DAILY. 90 tablet 3   ferrous sulfate 325 (65 FE) MG tablet Take 325 mg by mouth every other day.     levothyroxine (SYNTHROID) 50 MCG tablet Take 1 tablet (50 mcg total) by mouth daily. 45 tablet 1   tamoxifen (NOLVADEX) 10 MG tablet TAKE 1 TABLET BY MOUTH TWICE A DAY 60 tablet 0   valACYclovir (VALTREX) 500 MG tablet Take 500 mg by mouth as needed.      No results found for this or any previous visit (from the past 48 hours). No results found.  Review of Systems  All other systems reviewed and are negative.   Height  5\' 6"  (1.676 m), weight 63.5 kg, last menstrual period 09/09/2021. Physical Exam Vitals reviewed.  Constitutional:      Appearance: Normal appearance.  HENT:     Head: Normocephalic and atraumatic.     Right Ear: External ear normal.     Left Ear: External ear normal.     Nose: Nose normal.     Mouth/Throat:     Mouth: Mucous membranes are moist.  Eyes:     General: No scleral icterus.    Pupils: Pupils are equal, round, and reactive to light.  Cardiovascular:     Rate and Rhythm: Normal rate.  Pulmonary:     Effort: Pulmonary effort is normal. No respiratory distress.  Abdominal:     General: Abdomen is flat.  Musculoskeletal:        General: No deformity.  Skin:    General: Skin is warm and  dry.     Capillary Refill: Capillary refill takes 2 to 3 seconds.  Neurological:     General: No focal deficit present.     Mental Status: She is alert and oriented to person, place, and time.  Psychiatric:        Mood and Affect: Mood normal.        Behavior: Behavior normal.        Thought Content: Thought content normal.        Judgment: Judgment normal.      Assessment/Plan Port in place Right breast cancer, s/p BCT and adjuvant chemo.  Plan port removal Discussed procedure, reviewed risks and post op recovery expectations.  Patient wishes to proceed.   Almond Lint, MD 09/12/2023, 2:07 PM

## 2023-09-12 NOTE — Transfer of Care (Signed)
 Immediate Anesthesia Transfer of Care Note  Patient: Dawn Bates  Procedure(s) Performed: Procedure(s) (LRB): REMOVAL PORT-A-CATH (N/A)  Patient Location: PACU  Anesthesia Type: MAC  Level of Consciousness: awake, alert , oriented and patient cooperative  Airway & Oxygen Therapy: Patient Spontanous Breathing Room Air  Post-op Assessment: Report given to PACU RN and Post -op Vital signs reviewed and stable  Post vital signs: Reviewed and stable  Complications: No apparent anesthesia complications Last Vitals:  Vitals Value Taken Time  BP 141/72 09/12/23 1530  Temp    Pulse 64 09/12/23 1530  Resp 15 09/12/23 1530  SpO2 99 % 09/12/23 1530  Vitals shown include unfiled device data.  Last Pain:  Vitals:   09/12/23 1428  PainSc: 0-No pain      Patients Stated Pain Goal: 4 (09/12/23 1419)  Complications: No notable events documented.

## 2023-09-13 ENCOUNTER — Encounter (HOSPITAL_BASED_OUTPATIENT_CLINIC_OR_DEPARTMENT_OTHER): Payer: Self-pay | Admitting: General Surgery

## 2023-09-14 NOTE — Anesthesia Postprocedure Evaluation (Signed)
 Anesthesia Post Note  Patient: Dawn Bates  Procedure(s) Performed: REMOVAL PORT-A-CATH (Chest)     Patient location during evaluation: PACU Anesthesia Type: MAC Level of consciousness: awake and alert Pain management: pain level controlled Vital Signs Assessment: post-procedure vital signs reviewed and stable Respiratory status: spontaneous breathing, nonlabored ventilation and respiratory function stable Cardiovascular status: stable and blood pressure returned to baseline Postop Assessment: no apparent nausea or vomiting Anesthetic complications: no   No notable events documented.  Last Vitals:  Vitals:   09/12/23 1553 09/12/23 1600  BP:  (!) 146/90  Pulse: (!) 57 65  Resp: 17 16  Temp:  (!) 36.3 C  SpO2: 99% 100%    Last Pain:  Vitals:   09/13/23 1038  PainSc: 6                  Genella Bas

## 2023-09-17 ENCOUNTER — Other Ambulatory Visit: Payer: Self-pay | Admitting: Internal Medicine

## 2023-09-22 ENCOUNTER — Other Ambulatory Visit: Payer: Self-pay

## 2023-09-22 ENCOUNTER — Telehealth: Payer: Self-pay | Admitting: Hematology and Oncology

## 2023-09-22 ENCOUNTER — Encounter: Payer: Self-pay | Admitting: Adult Health

## 2023-09-22 ENCOUNTER — Inpatient Hospital Stay: Attending: Hematology and Oncology | Admitting: Adult Health

## 2023-09-22 VITALS — BP 159/78 | HR 66 | Temp 97.9°F | Resp 18 | Ht 66.0 in | Wt 139.0 lb

## 2023-09-22 DIAGNOSIS — C50411 Malignant neoplasm of upper-outer quadrant of right female breast: Secondary | ICD-10-CM | POA: Diagnosis present

## 2023-09-22 DIAGNOSIS — Z79899 Other long term (current) drug therapy: Secondary | ICD-10-CM | POA: Diagnosis not present

## 2023-09-22 DIAGNOSIS — Z170 Estrogen receptor positive status [ER+]: Secondary | ICD-10-CM | POA: Diagnosis not present

## 2023-09-22 DIAGNOSIS — Z1731 Human epidermal growth factor receptor 2 positive status: Secondary | ICD-10-CM | POA: Diagnosis not present

## 2023-09-22 DIAGNOSIS — Z9221 Personal history of antineoplastic chemotherapy: Secondary | ICD-10-CM | POA: Diagnosis not present

## 2023-09-22 DIAGNOSIS — Z803 Family history of malignant neoplasm of breast: Secondary | ICD-10-CM | POA: Diagnosis not present

## 2023-09-22 DIAGNOSIS — Z923 Personal history of irradiation: Secondary | ICD-10-CM | POA: Diagnosis not present

## 2023-09-22 DIAGNOSIS — Z1721 Progesterone receptor positive status: Secondary | ICD-10-CM | POA: Diagnosis not present

## 2023-09-22 NOTE — Telephone Encounter (Signed)
 Left vm about scheduled appt date and time.

## 2023-09-22 NOTE — Progress Notes (Signed)
 SURVIVORSHIP VISIT:  BRIEF ONCOLOGIC HISTORY:  Oncology History  Malignant neoplasm of upper-outer quadrant of right breast in female, estrogen receptor positive (HCC)  05/26/2022 Mammogram   Diagnostic mammogram after patient noticed a palpable lump in the right breast showed suspicious mass at the palpable site of concern in the right breast at 10:00 measuring 1.2 cm. Ultrasound of the right breast showed no evidence of lymphadenopathy.   06/06/2022 Pathology Results   Right breast needle core biopsy at 10:00 showed overall grade 2 invasive ductal carcinoma.  Prognostic showed ER 100% positive strong staining PR 5% positive strong staining, Ki-67 of 50% HER2 3+ by [facility]   06/06/2022 Cancer Staging   Staging form: Breast, AJCC 8th Edition - Clinical stage from 06/06/2022: Stage IA (cT1c, cN0, cM0, G3, ER+, PR+, HER2+) - Signed by [name], MD on 07/19/2022 Stage prefix: Initial diagnosis Histologic grading system: 3 grade system   06/26/2022 Genetic Testing   Negative [facility] CancerNext Expanded Panel.  Report date is 06/26/2022.   The CancerNext-Expanded gene panel offered by [facility] and includes sequencing and rearrangement analysis for the following 77 genes: AIP, ALK, APC, ATM, AXIN2, BAP1, BARD1, BLM, BMPR1A, BRCA1, BRCA2, BRIP1, CDC73, CDH1, CDK4, CDKN1B, CDKN2A, CHEK2, CTNNA1, DICER1, FANCC, FH, FLCN, GALNT12, KIF1B, LZTR1, MAX, MEN1, MET, MLH1, MSH2, MSH3, MSH6, MUTYH, NBN, NF1, NF2, NTHL1, PALB2, PHOX2B, PMS2, POT1, PRKAR1A, PTCH1, PTEN, RAD51C, RAD51D, RB1, RECQL, RET, SDHA, SDHAF2, SDHB, SDHC, SDHD, SMAD4, SMARCA4, SMARCB1, SMARCE1, STK11, SUFU, TMEM127, TP53, TSC1, TSC2, VHL and XRCC2 (sequencing and deletion/duplication); EGFR, EGLN1, HOXB13, KIT, MITF, PDGFRA, POLD1, and POLE (sequencing only); EPCAM and GREM1 (deletion/duplication only).    06/28/2022 Initial Diagnosis   Malignant neoplasm of upper-outer quadrant of right breast in female, estrogen receptor positive (HCC)    07/05/2022 Surgery   Right breast lumpectomy: 1.3 cm invasive poorly differentiated ductal carcinoma grade 3, margins negative, 2 sentinel lymph nodes negative   07/05/2022 Cancer Staging   Staging form: Breast, AJCC 8th Edition - Pathologic stage from 07/05/2022: Stage IA (pT1c, pN0, cM0, G3, ER+, PR+, HER2+) - Signed by [name], NP on 08/02/2022 Stage prefix: Initial diagnosis Histologic grading system: 3 grade system   08/02/2022 - 07/04/2023 Chemotherapy   Patient is on Treatment Plan : BREAST Paclitaxel + Trastuzumab q7d / Trastuzumab q21d     11/16/2022 - 12/13/2022 Radiation Therapy   Plan Name: Breast_R Site: Breast, Right Technique: 3D Mode: Photon Dose Per Fraction: 2.67 Gy Prescribed Dose (Delivered / Prescribed): 40.05 Gy / 40.05 Gy Prescribed Fxs (Delivered / Prescribed): 15 / 15   Plan Name: Breast_R_Bst Site: Breast, Right Technique: 3D Mode: Photon Dose Per Fraction: 2 Gy Prescribed Dose (Delivered / Prescribed): 10 Gy / 10 Gy Prescribed Fxs (Delivered / Prescribed): 5 / 5   12/2022 -  Anti-estrogen oral therapy   Tamoxifen daily     INTERVAL HISTORY:   Discussed the use of AI scribe software for clinical note transcription with the patient, who gave verbal consent to proceed.  with a history of stage 1A breast cancer, presents for a follow-up visit. The cancer was estrogen, progesterone, and HER2 positive. The patient underwent surgery, during which two lymph nodes were removed and found to be negative. The patient also received chemotherapy and Herceptin treatment. The patient completed radiation therapy, the dates and dosages of which are documented. The patient is currently on tamoxifen. The patient has been experiencing side effects from the treatments, including tiredness, discoloration, and swelling, which are expected to improve  over time. The patient is aware of the potential late-term side effects that can occur five or more years after  treatment. The patient also had a hysterectomy in the past, eliminating the risk of endometrial cancer associated with tamoxifen use. The patient has been maintaining a healthy diet, limiting red meat and processed foods, and incorporating a variety of fruits and vegetables. The patient also had a port, which has been removed.  REVIEW OF SYSTEMS:  Review of Systems  Constitutional:  Positive for fatigue. Negative for appetite change, chills, fever and unexpected weight change.  HENT:   Negative for hearing loss, lump/mass and trouble swallowing.   Eyes:  Negative for eye problems and icterus.  Respiratory:  Negative for chest tightness, cough and shortness of breath.   Cardiovascular:  Negative for chest pain, leg swelling and palpitations.  Gastrointestinal:  Negative for abdominal distention, abdominal pain, constipation, diarrhea, nausea and vomiting.  Endocrine: Negative for hot flashes.  Genitourinary:  Negative for difficulty urinating.   Musculoskeletal:  Negative for arthralgias.  Skin:  Negative for itching and rash.  Neurological:  Negative for dizziness, extremity weakness, headaches and numbness.  Hematological:  Negative for adenopathy. Does not bruise/bleed easily.  Psychiatric/Behavioral:  Negative for depression. The patient is not nervous/anxious.    Breast: Denies any new nodularity, masses, tenderness, nipple changes, or nipple discharge.       PAST MEDICAL/SURGICAL HISTORY:  Past Medical History:  Diagnosis Date   Abnormal uterine bleeding 2022   Anemia 02/12/2021   Patient is taking iron supplementation.   Cancer [facility])    breast   COVID-19 2020   cold-like symptoms   Endometriosis    Fibroids    Graves disease    Hypertension    Hyperthyroidism 2022   s/p Nuclear Medicine RAI therapy on 08/07/20   Personal history of chemotherapy    Personal history of radiation therapy    Port-A-Cath in place 08/02/2022   Seasonal allergies    severe chronic allergies,  runny nose, congestion, cough when she lays down due to drainage   Vitamin D deficiency 2021   Wears glasses    Past Surgical History:  Procedure Laterality Date   BREAST BIOPSY Right 06/06/2022   [place] RT BREAST BX W LOC DEV 1ST LESION IMG BX SPEC US GUIDE 06/06/2022 [facility]   BREAST LUMPECTOMY     BREAST LUMPECTOMY WITH SENTINEL LYMPH NODE BIOPSY Right 07/05/2022   Procedure: RIGHT BREAST LUMPECTOMY WITH SENTINEL LYMPH NODE BX;  Surgeon: Almond Lint, MD;  Location: [facility];  Service: General;  Laterality: Right;   CYSTOSCOPY  10/04/2021   Procedure: CYSTOSCOPY;  Surgeon: Jaymes Graff, MD;  Location: [facility];  Service: Gynecology;;   LAPAROSCOPIC VAGINAL HYSTERECTOMY WITH SALPINGECTOMY N/A 10/04/2021   Procedure: LAPAROSCOPIC ASSISTED VAGINAL HYSTERECTOMY WITH SALPINGECTOMY;  Surgeon: Jaymes Graff, MD;  Location: [facility];  Service: Gynecology;  Laterality: N/A;   PORT-A-CATH REMOVAL N/A 09/12/2023   Procedure: REMOVAL PORT-A-CATH;  Surgeon: Almond Lint, MD;  Location: [facility];  Service: General;  Laterality: N/A;   PORTACATH PLACEMENT Left 07/05/2022   Procedure: INSERTION PORT-A-CATH;  Surgeon: Almond Lint, MD;  Location: [facility];  Service: General;  Laterality: Left;   TUBAL LIGATION  09/2008     ALLERGIES:  Allergies  Allergen Reactions   Latex Itching     CURRENT MEDICATIONS:  Outpatient Encounter Medications as of 09/22/2023  Medication Sig   albuterol (VENTOLIN HFA) 108 (  90 Base) MCG/ACT inhaler TAKE 2 PUFFS BY MOUTH EVERY 6 HOURS AS NEEDED FOR WHEEZE OR SHORTNESS OF BREATH   amLODipine (NORVASC) 5 MG tablet TAKE 1 TABLET (5 MG TOTAL) BY MOUTH DAILY.   ferrous sulfate 325 (65 FE) MG tablet Take 325 mg by mouth every other day.   levothyroxine (SYNTHROID) 50 MCG tablet TAKE 1 TABLET BY MOUTH EVERY DAY   oxyCODONE (OXY IR/ROXICODONE) 5 MG immediate release tablet Take 1  tablet (5 mg total) by mouth every 6 (six) hours as needed for severe pain (pain score 7-10).   tamoxifen (NOLVADEX) 10 MG tablet TAKE 1 TABLET BY MOUTH TWICE A DAY   valACYclovir (VALTREX) 500 MG tablet Take 500 mg by mouth as needed.   No facility-administered encounter medications on file as of 09/22/2023.     ONCOLOGIC FAMILY HISTORY:  Family History  Problem Relation Age of Onset   Breast cancer Sister 22       triple negative; d. early 64s   Cancer Paternal Aunt        unknown type   Thyroid cancer Half-Sister        mat half sister; dx 8s; unknown cell type     SOCIAL HISTORY:  Social History   Socioeconomic History   Marital status: Married    Spouse name: Not on file   Number of children: Not on file   Years of education: Not on file   Highest education level: Not on file  Occupational History   Not on file  Tobacco Use   Smoking status: Never   Smokeless tobacco: Never  Vaping Use   Vaping status: Never Used  Substance and Sexual Activity   Alcohol use: No   Drug use: No   Sexual activity: Yes    Partners: Male    Birth control/protection: Surgical    Comment: BTL   Other Topics Concern   Not on file  Social History Narrative   Not on file   Social Drivers of Health   Financial Resource Strain: Not on file  Food Insecurity: No Food Insecurity (01/12/2023)   Hunger Vital Sign    Worried About Running Out of Food in the Last Year: Never true    Ran Out of Food in the Last Year: Never true  Transportation Needs: No Transportation Needs (01/12/2023)   PRAPARE - Administrator, Civil Service (Medical): No    Lack of Transportation (Non-Medical): No  Physical Activity: Not on file  Stress: Not on file  Social Connections: Not on file  Intimate Partner Violence: Not At Risk (01/12/2023)   Humiliation, Afraid, Rape, and Kick questionnaire    Fear of Current or Ex-Partner: No    Emotionally Abused: No    Physically Abused: No    Sexually  Abused: No     OBSERVATIONS/OBJECTIVE:  BP (!) 159/78 (BP Location: Left Arm, Patient Position: Sitting) Comment: Nurse notified  Pulse 66   Temp 97.9 F (36.6 C) (Temporal)   Resp 18   Ht 5\' 6"  (1.676 m)   Wt 139 lb (63 kg)   LMP 09/09/2021 (Exact Date)   SpO2 100%   BMI 22.44 kg/m  GENERAL: Patient is a well appearing female in no acute distress HEENT:  Sclerae anicteric.  Oropharynx clear and moist. No ulcerations or evidence of oropharyngeal candidiasis. Neck is supple.  NODES:  No cervical, supraclavicular, or axillary lymphadenopathy palpated.  BREAST EXAM: Breast status post lumpectomy and radiation no sign of local  recurrence left breast is benign. LUNGS:  Clear to auscultation bilaterally.  No wheezes or rhonchi. HEART:  Regular rate and rhythm. No murmur appreciated. ABDOMEN:  Soft, nontender.  Positive, normoactive bowel sounds. No organomegaly palpated. MSK:  No focal spinal tenderness to palpation. Full range of motion bilaterally in the upper extremities. EXTREMITIES:  No peripheral edema.   SKIN:  Clear with no obvious rashes or skin changes. No nail dyscrasia. NEURO:  Nonfocal. Well oriented.  Appropriate affect.   LABORATORY DATA:  None for this visit.  DIAGNOSTIC IMAGING:  None for this visit.      ASSESSMENT AND PLAN:  Dawn Bates is a pleasant 53 y.o. female with Stage 1A right breast invasive ductal carcinoma, ER+/PR+/HER2+, diagnosed in December 2023, treated with lumpectomy, adjuvant chemotherapy, maintenance Herceptin, adjuvant radiation, and tamoxifen that began in July 2024.  She presents to the Survivorship Clinic for our initial meeting and routine follow-up post-completion of treatment for breast cancer.    1. Stage IA right breast cancer:  Ms. Desanctis is continuing to recover from definitive treatment for breast cancer. She will follow-up with her medical oncologist, Dr.  Al Pimple in 6 months with history and physical exam per surveillance  protocol.  She will continue her anti-estrogen therapy with Tamoxifen. Thus far, she is tolerating the TAmoxifen well, with minimal side effects. Her mammogram is due 05/2024.   Today, a comprehensive survivorship care plan and treatment summary was reviewed with the patient today detailing her breast cancer diagnosis, treatment course, potential late/long-term effects of treatment, appropriate follow-up care with recommendations for the future, and patient education resources.  A copy of this summary, along with a letter will be sent to the patient's primary care provider via mail/fax/In Basket message after today's visit.    2. Bone health: She was given education on specific activities to promote bone health.  3. Cancer screening:  Due to Ms. Bubar's history and her age, she should receive screening for skin cancers, colon cancer, and gynecologic cancers.  The information and recommendations are listed on the patient's comprehensive care plan/treatment summary and were reviewed in detail with the patient.    4. Health maintenance and wellness promotion: Ms. Rommel was encouraged to consume 5-7 servings of fruits and vegetables per day. We reviewed the "Nutrition Rainbow" handout.  She was also encouraged to engage in moderate to vigorous exercise for 30 minutes per day most days of the week.  She was instructed to limit her alcohol consumption and continue to abstain from tobacco use.     5. Support services/counseling: It is not uncommon for this period of the patient's cancer care trajectory to be one of many emotions and stressors.   She was given information regarding our available services and encouraged to contact me with any questions or for help enrolling in any of our support group/programs.    Follow up instructions:    -Return to cancer center in 6 months for f/u with Dr. Al Pimple  -Mammogram due in 05/2024 -She is welcome to return back to the Survivorship Clinic at any time; no  additional follow-up needed at this time.  -Consider referral back to survivorship as a long-term survivor for continued surveillance  The patient was provided an opportunity to ask questions and all were answered. The patient agreed with the plan and demonstrated an understanding of the instructions.   Total encounter time:30 minutes*in face-to-face visit time, chart review, lab review, care coordination, order entry, and documentation of the encounter time.  Lillard Anes, NP 09/25/23 3:49 PM Medical Oncology and Hematology [facility] [address] [place], [place] [address] Tel. [phone]    Fax. [phone]  *Total Encounter Time as defined by the Centers for Medicare and Medicaid Services includes, in addition to the face-to-face time of a patient visit (documented in the note above) non-face-to-face time: obtaining and reviewing outside history, ordering and reviewing medications, tests or procedures, care coordination (communications with other health care professionals or caregivers) and documentation in the medical record.

## 2023-09-29 ENCOUNTER — Other Ambulatory Visit: Payer: Self-pay | Admitting: Family Medicine

## 2023-09-29 DIAGNOSIS — R062 Wheezing: Secondary | ICD-10-CM

## 2023-10-04 ENCOUNTER — Other Ambulatory Visit: Payer: Self-pay | Admitting: Hematology and Oncology

## 2023-10-04 DIAGNOSIS — Z170 Estrogen receptor positive status [ER+]: Secondary | ICD-10-CM

## 2023-11-05 ENCOUNTER — Other Ambulatory Visit: Payer: Self-pay | Admitting: Hematology and Oncology

## 2023-11-05 DIAGNOSIS — Z170 Estrogen receptor positive status [ER+]: Secondary | ICD-10-CM

## 2023-11-13 ENCOUNTER — Other Ambulatory Visit: Payer: Self-pay | Admitting: Internal Medicine

## 2023-11-13 ENCOUNTER — Other Ambulatory Visit: Payer: Self-pay | Admitting: Family Medicine

## 2023-12-03 ENCOUNTER — Other Ambulatory Visit: Payer: Self-pay | Admitting: Adult Health

## 2023-12-03 DIAGNOSIS — C50411 Malignant neoplasm of upper-outer quadrant of right female breast: Secondary | ICD-10-CM

## 2024-01-08 ENCOUNTER — Other Ambulatory Visit: Payer: Self-pay | Admitting: Adult Health

## 2024-01-08 DIAGNOSIS — C50411 Malignant neoplasm of upper-outer quadrant of right female breast: Secondary | ICD-10-CM

## 2024-01-08 NOTE — Telephone Encounter (Signed)
 Per last office note instructed to continue Tamoxifen .

## 2024-02-08 ENCOUNTER — Other Ambulatory Visit: Payer: Self-pay | Admitting: Adult Health

## 2024-02-08 DIAGNOSIS — Z170 Estrogen receptor positive status [ER+]: Secondary | ICD-10-CM

## 2024-02-09 NOTE — Telephone Encounter (Signed)
 Per April 2025 OV, continue tamoxifen . Andrea CHRISTELLA Plunk, RN

## 2024-03-10 ENCOUNTER — Other Ambulatory Visit: Payer: Self-pay | Admitting: Adult Health

## 2024-03-10 DIAGNOSIS — C50411 Malignant neoplasm of upper-outer quadrant of right female breast: Secondary | ICD-10-CM

## 2024-03-16 ENCOUNTER — Other Ambulatory Visit: Payer: Self-pay | Admitting: Internal Medicine

## 2024-03-22 ENCOUNTER — Telehealth: Payer: Self-pay

## 2024-03-22 NOTE — Telephone Encounter (Signed)
 Spoke with patient and confirmed appointment on 10/6

## 2024-03-24 ENCOUNTER — Other Ambulatory Visit: Payer: Self-pay | Admitting: Internal Medicine

## 2024-03-25 ENCOUNTER — Inpatient Hospital Stay: Attending: Hematology and Oncology | Admitting: Hematology and Oncology

## 2024-03-25 ENCOUNTER — Other Ambulatory Visit: Payer: Self-pay | Admitting: Internal Medicine

## 2024-03-25 ENCOUNTER — Other Ambulatory Visit: Payer: Self-pay | Admitting: *Deleted

## 2024-03-25 ENCOUNTER — Inpatient Hospital Stay

## 2024-03-25 VITALS — BP 139/80 | HR 61 | Temp 97.5°F | Resp 17 | Wt 149.4 lb

## 2024-03-25 DIAGNOSIS — Z170 Estrogen receptor positive status [ER+]: Secondary | ICD-10-CM | POA: Diagnosis not present

## 2024-03-25 DIAGNOSIS — Z79899 Other long term (current) drug therapy: Secondary | ICD-10-CM | POA: Diagnosis not present

## 2024-03-25 DIAGNOSIS — C50411 Malignant neoplasm of upper-outer quadrant of right female breast: Secondary | ICD-10-CM | POA: Diagnosis not present

## 2024-03-25 DIAGNOSIS — Z923 Personal history of irradiation: Secondary | ICD-10-CM | POA: Diagnosis not present

## 2024-03-25 DIAGNOSIS — Z1731 Human epidermal growth factor receptor 2 positive status: Secondary | ICD-10-CM | POA: Insufficient documentation

## 2024-03-25 DIAGNOSIS — N951 Menopausal and female climacteric states: Secondary | ICD-10-CM | POA: Diagnosis not present

## 2024-03-25 DIAGNOSIS — Z808 Family history of malignant neoplasm of other organs or systems: Secondary | ICD-10-CM | POA: Insufficient documentation

## 2024-03-25 DIAGNOSIS — Z9221 Personal history of antineoplastic chemotherapy: Secondary | ICD-10-CM | POA: Diagnosis not present

## 2024-03-25 DIAGNOSIS — Z803 Family history of malignant neoplasm of breast: Secondary | ICD-10-CM | POA: Diagnosis not present

## 2024-03-25 DIAGNOSIS — Z1721 Progesterone receptor positive status: Secondary | ICD-10-CM | POA: Diagnosis not present

## 2024-03-25 DIAGNOSIS — Z809 Family history of malignant neoplasm, unspecified: Secondary | ICD-10-CM | POA: Diagnosis not present

## 2024-03-25 DIAGNOSIS — N898 Other specified noninflammatory disorders of vagina: Secondary | ICD-10-CM | POA: Insufficient documentation

## 2024-03-25 LAB — CMP (CANCER CENTER ONLY)
ALT: 15 U/L (ref 0–44)
AST: 16 U/L (ref 15–41)
Albumin: 4.3 g/dL (ref 3.5–5.0)
Alkaline Phosphatase: 47 U/L (ref 38–126)
Anion gap: 4 — ABNORMAL LOW (ref 5–15)
BUN: 15 mg/dL (ref 6–20)
CO2: 31 mmol/L (ref 22–32)
Calcium: 9.9 mg/dL (ref 8.9–10.3)
Chloride: 106 mmol/L (ref 98–111)
Creatinine: 0.76 mg/dL (ref 0.44–1.00)
GFR, Estimated: >60 mL/min (ref >60)
Glucose, Bld: 77 mg/dL (ref 70–99)
Potassium: 3.8 mmol/L (ref 3.5–5.1)
Sodium: 141 mmol/L (ref 135–145)
Total Bilirubin: 0.5 mg/dL (ref 0.0–1.2)
Total Protein: 7.6 g/dL (ref 6.5–8.1)

## 2024-03-25 NOTE — Progress Notes (Signed)
 error

## 2024-03-25 NOTE — Progress Notes (Signed)
 BRIEF ONCOLOGIC HISTORY:  Oncology History  Malignant neoplasm of upper-outer quadrant of right breast in female, estrogen receptor positive (HCC)  05/26/2022 Mammogram   Diagnostic mammogram after patient noticed a palpable lump in the right breast showed suspicious mass at the palpable site of concern in the right breast at 10:00 measuring 1.2 cm. Ultrasound of the right breast showed no evidence of lymphadenopathy.   06/06/2022 Pathology Results   Right breast needle core biopsy at 10:00 showed overall grade 2 invasive ductal carcinoma.  Prognostic showed ER 100% positive strong staining PR 5% positive strong staining, Ki-67 of 50% HER2 3+ by IHC   06/06/2022 Cancer Staging   Staging form: Breast, AJCC 8th Edition - Clinical stage from 06/06/2022: Stage IA (cT1c, cN0, cM0, G3, ER+, PR+, HER2+) - Signed by Loretha Ash, MD on 07/19/2022 Stage prefix: Initial diagnosis Histologic grading system: 3 grade system   06/26/2022 Genetic Testing   Negative [facility] CancerNext Expanded Panel.  Report date is 06/26/2022.   The CancerNext-Expanded gene panel offered by [facility] and includes sequencing and rearrangement analysis for the following 77 genes: AIP, ALK, APC, ATM, AXIN2, BAP1, BARD1, BLM, BMPR1A, BRCA1, BRCA2, BRIP1, CDC73, CDH1, CDK4, CDKN1B, CDKN2A, CHEK2, CTNNA1, DICER1, FANCC, FH, FLCN, GALNT12, KIF1B, LZTR1, MAX, MEN1, MET, MLH1, MSH2, MSH3, MSH6, MUTYH, NBN, NF1, NF2, NTHL1, PALB2, PHOX2B, PMS2, POT1, PRKAR1A, PTCH1, PTEN, RAD51C, RAD51D, RB1, RECQL, RET, SDHA, SDHAF2, SDHB, SDHC, SDHD, SMAD4, SMARCA4, SMARCB1, SMARCE1, STK11, SUFU, TMEM127, TP53, TSC1, TSC2, VHL and XRCC2 (sequencing and deletion/duplication); EGFR, EGLN1, HOXB13, KIT, MITF, PDGFRA, POLD1, and POLE (sequencing only); EPCAM and GREM1 (deletion/duplication only).    06/28/2022 Initial Diagnosis   Malignant neoplasm of upper-outer quadrant of right breast in female, estrogen receptor positive (HCC)   07/05/2022 Surgery    Right breast lumpectomy: 1.3 cm invasive poorly differentiated ductal carcinoma grade 3, margins negative, 2 sentinel lymph nodes negative   07/05/2022 Cancer Staging   Staging form: Breast, AJCC 8th Edition - Pathologic stage from 07/05/2022: Stage IA (pT1c, pN0, cM0, G3, ER+, PR+, HER2+) - Signed by Crawford Morna Pickle, NP on 08/02/2022 Stage prefix: Initial diagnosis Histologic grading system: 3 grade system   08/02/2022 - 07/04/2023 Chemotherapy   Patient is on Treatment Plan : BREAST Paclitaxel  + Trastuzumab  q7d / Trastuzumab  q21d     11/16/2022 - 12/13/2022 Radiation Therapy   Plan Name: Breast_R Site: Breast, Right Technique: 3D Mode: Photon Dose Per Fraction: 2.67 Gy Prescribed Dose (Delivered / Prescribed): 40.05 Gy / 40.05 Gy Prescribed Fxs (Delivered / Prescribed): 15 / 15   Plan Name: Breast_R_Bst Site: Breast, Right Technique: 3D Mode: Photon Dose Per Fraction: 2 Gy Prescribed Dose (Delivered / Prescribed): 10 Gy / 10 Gy Prescribed Fxs (Delivered / Prescribed): 5 / 5   12/2022 -  Anti-estrogen oral therapy   Tamoxifen  daily     INTERVAL HISTORY:   Discussed the use of AI scribe software for clinical note transcription with the patient, who gave verbal consent to proceed.  History of Present Illness Dawn Bates is a 53 year old female with breast cancer who presents for follow-up regarding tamoxifen  treatment.  She is currently taking tamoxifen  and experiences hot flashes and mild vaginal discharge, which she describes as manageable. She frequently feels tired and gets winded when walking for extended periods. She also reports persistent bone pain.  A mammogram in December was normal, and she is due for another in one year. She had an x-ray for an unchanged pleural reaction, possibly  related to a past pneumonia, from which she has since recovered.  She reports pain and sensitivity in the breast area and mentions weakness in the area where the port was  placed.  She works as a Child psychotherapist and has been able to continue working throughout her treatments, taking time off only for appointments. She lives with her daughter and grandchild.  Rest of the pertinent 10 point ROS reviewed and neg. PAST MEDICAL/SURGICAL HISTORY:  Past Medical History:  Diagnosis Date   Abnormal uterine bleeding 2022   Anemia 02/12/2021   Patient is taking iron supplementation.   Cancer [facility])    breast   COVID-19 2020   cold-like symptoms   Endometriosis    Fibroids    Graves disease    Hypertension    Hyperthyroidism 2022   s/p Nuclear Medicine RAI therapy on 08/07/20   Personal history of chemotherapy    Personal history of radiation therapy    Port-A-Cath in place 08/02/2022   Seasonal allergies    severe chronic allergies, runny nose, congestion, cough when she lays down due to drainage   Vitamin D  deficiency 2021   Wears glasses    Past Surgical History:  Procedure Laterality Date   BREAST BIOPSY Right 06/06/2022   US  RT BREAST BX W LOC DEV 1ST LESION IMG BX SPEC US  GUIDE 06/06/2022 [facility]MO[facility]   BREAST LUMPECTOMY     BREAST LUMPECTOMY WITH SENTINEL LYMPH NODE BIOPSY Right 07/05/2022   Procedure: RIGHT BREAST LUMPECTOMY WITH SENTINEL LYMPH NODE BX;  Surgeon: Aron Shoulders, MD;  Location: [facility];  Service: General;  Laterality: Right;   CYSTOSCOPY  10/04/2021   Procedure: CYSTOSCOPY;  Surgeon: Armond Cape, MD;  Location: [facility];  Service: Gynecology;;   LAPAROSCOPIC VAGINAL HYSTERECTOMY WITH SALPINGECTOMY N/A 10/04/2021   Procedure: LAPAROSCOPIC ASSISTED VAGINAL HYSTERECTOMY WITH SALPINGECTOMY;  Surgeon: Armond Cape, MD;  Location: [facility];  Service: Gynecology;  Laterality: N/A;   PORT-A-CATH REMOVAL N/A 09/12/2023   Procedure: REMOVAL PORT-A-CATH;  Surgeon: Aron Shoulders, MD;  Location: [facility];  Service: General;  Laterality: N/A;   PORTACATH PLACEMENT Left  07/05/2022   Procedure: INSERTION PORT-A-CATH;  Surgeon: Aron Shoulders, MD;  Location: [facility];  Service: General;  Laterality: Left;   TUBAL LIGATION  09/2008     ALLERGIES:  Allergies  Allergen Reactions   Latex Itching     CURRENT MEDICATIONS:  Outpatient Encounter Medications as of 03/25/2024  Medication Sig   albuterol  (VENTOLIN  HFA) 108 (90 Base) MCG/ACT inhaler TAKE 2 PUFFS BY MOUTH EVERY 6 HOURS AS NEEDED FOR WHEEZE OR SHORTNESS OF BREATH   amLODipine  (NORVASC ) 5 MG tablet TAKE 1 TABLET (5 MG TOTAL) BY MOUTH DAILY.   ferrous sulfate  325 (65 FE) MG tablet Take 325 mg by mouth every other day.   levothyroxine  (SYNTHROID ) 50 MCG tablet TAKE 1 TABLET BY MOUTH EVERY DAY   oxyCODONE  (OXY IR/ROXICODONE ) 5 MG immediate release tablet Take 1 tablet (5 mg total) by mouth every 6 (six) hours as needed for severe pain (pain score 7-10).   tamoxifen  (NOLVADEX ) 10 MG tablet TAKE 1 TABLET BY MOUTH TWICE A DAY   valACYclovir (VALTREX) 500 MG tablet Take 500 mg by mouth as needed.   No facility-administered encounter medications on file as of 03/25/2024.     ONCOLOGIC FAMILY HISTORY:  Family History  Problem Relation Age of Onset   Breast cancer Sister 53  triple negative; d. early 63s   Cancer Paternal Aunt        unknown type   Thyroid  cancer Half-Sister        mat half sister; dx 26s; unknown cell type     SOCIAL HISTORY:  Social History   Socioeconomic History   Marital status: Married    Spouse name: Not on file   Number of children: Not on file   Years of education: Not on file   Highest education level: Not on file  Occupational History   Not on file  Tobacco Use   Smoking status: Never   Smokeless tobacco: Never  Vaping Use   Vaping status: Never Used  Substance and Sexual Activity   Alcohol use: No   Drug use: No   Sexual activity: Yes    Partners: Male    Birth control/protection: Surgical    Comment: BTL   Other Topics Concern    Not on file  Social History Narrative   Not on file   Social Drivers of Health   Financial Resource Strain: Not on file  Food Insecurity: No Food Insecurity (01/12/2023)   Hunger Vital Sign    Worried About Running Out of Food in the Last Year: Never true    Ran Out of Food in the Last Year: Never true  Transportation Needs: No Transportation Needs (01/12/2023)   PRAPARE - Administrator, Civil Service (Medical): No    Lack of Transportation (Non-Medical): No  Physical Activity: Not on file  Stress: Not on file  Social Connections: Not on file  Intimate Partner Violence: Not At Risk (01/12/2023)   Humiliation, Afraid, Rape, and Kick questionnaire    Fear of Current or Ex-Partner: No    Emotionally Abused: No    Physically Abused: No    Sexually Abused: No     OBSERVATIONS/OBJECTIVE:  BP 139/80 (BP Location: Left Arm, Patient Position: Sitting)   Pulse 61   Temp (!) 97.5 F (36.4 C) (Temporal)   Resp 17   Wt 149 lb 6.4 oz (67.8 kg)   LMP 09/09/2021 (Exact Date)   SpO2 100%   BMI 24.11 kg/m  GENERAL: Patient is a well appearing female in no acute distress NODES:  No cervical, supraclavicular, or axillary lymphadenopathy palpated.  BREAST EXAM: right breast status post lumpectomy and radiation no sign of local recurrence left breast is benign. NEURO:  Nonfocal. Well oriented.  Appropriate affect.  LABORATORY DATA:  None for this visit.  DIAGNOSTIC IMAGING:  None for this visit.      ASSESSMENT AND PLAN:  Ms.. Sherfield is a pleasant 53 y.o. female with Stage 1A right breast invasive ductal carcinoma, ER+/PR+/HER2+, diagnosed in December 2023, treated with lumpectomy, adjuvant chemotherapy, maintenance Herceptin , adjuvant radiation, and tamoxifen  that began in July 2024.   Assessment and Plan Assessment & Plan Breast cancer Breast cancer well-managed with tamoxifen . Participating in [place] study. - Continue tamoxifen  therapy. - Draw CMP  annually while on  tamoxifen . - No concern for recurrence on ROS or PE today.  Hot flashes and vaginal discharge secondary to tamoxifen  therapy Hot flashes and mild vaginal discharge due to tamoxifen . Symptoms manageable.  Bone health - Recommend calcium with vitamin D , weight bearing exercises.   Time spent: 20 min.  *Total Encounter Time as defined by the Centers for Medicare and Medicaid Services includes, in addition to the face-to-face time of a patient visit (documented in the note above) non-face-to-face time: obtaining and reviewing outside  history, ordering and reviewing medications, tests or procedures, care coordination (communications with other health care professionals or caregivers) and documentation in the medical record.

## 2024-04-08 ENCOUNTER — Encounter: Payer: Self-pay | Admitting: Internal Medicine

## 2024-04-08 ENCOUNTER — Ambulatory Visit: Payer: BC Managed Care – PPO | Admitting: Internal Medicine

## 2024-04-08 ENCOUNTER — Other Ambulatory Visit

## 2024-04-08 VITALS — BP 136/74 | HR 76 | Ht 66.0 in | Wt 150.2 lb

## 2024-04-08 DIAGNOSIS — E890 Postprocedural hypothyroidism: Secondary | ICD-10-CM | POA: Diagnosis not present

## 2024-04-08 DIAGNOSIS — Z8639 Personal history of other endocrine, nutritional and metabolic disease: Secondary | ICD-10-CM | POA: Diagnosis not present

## 2024-04-08 LAB — T4, FREE: Free T4: 1.2 ng/dL (ref 0.8–1.8)

## 2024-04-08 LAB — TSH: TSH: 1.84 m[IU]/L

## 2024-04-08 NOTE — Patient Instructions (Signed)
 Please continue Levothyroxine  50 mcg daily.  Take the thyroid  hormone, take this every day, with water, at least 30 minutes before breakfast, separated by at least 4 hours from: - acid reflux medications - calcium - iron - multivitamins  Please stop at the lab.  Please return in 1 year.

## 2024-04-08 NOTE — Progress Notes (Signed)
 Patient ID: Dawn Bates, female   DOB: 1970/10/03, 53 y.o.   MRN: [id number]  HPI  Dawn Bates is a 53 y.o.-year-old female, returning for follow-up for post ablative hypothyroidism for Graves ds. and thyroid  nodule.  She previously saw Dr. Kassie, last visit with me 6 months ago.  Interim history: Before last visit, she was diagnosed with breast cancer (estrogen receptor positive). She had surgery 06/2022, ChTx, RxTx. BRCA negative. Sister was diagnosed with breast cancer at 70 and died from it at 51 y/o (triple negative). She started on Tamoxifen  >> hot flushes. She otherwise feels well, but has post nasal drip.  Reviewed and addended history: Pt. has been dx with hypothyroidism in 07/2020, after RAI treatment for Graves ds. >> on Levothyroxine  25 mcg daily. She previously stopped the medication by herself, but we restarted it.  We then had to increase the dose to 50 mcg daily.  She is currently on  levothyroxine  to 50 mcg daily.  She takes this: - fasting - with water - separated by >30 min from b'fast  - stopped calcium citrate in the p.m. - stopped  iron in the pm - no PPIs, multivitamins  - on Biotin  I reviewed pt's thyroid  tests: Lab Results  Component Value Date   TSH 1.99 05/26/2023   TSH 3.97 09/30/2022   TSH 3.77 03/25/2022   TSH 1.85 07/28/2021   TSH 1.67 04/09/2021   TSH 0.01 (L) 02/05/2021   TSH 10.01 (H) 10/26/2020   TSH <0.01 Repeated and verified X2. (L) 06/24/2020   TSH <0.01 (L) 05/21/2020   TSH 1.27 07/31/2019   FREET4 1.1 05/26/2023   FREET4 1.2 09/30/2022   FREET4 1.1 03/25/2022   FREET4 1.0 07/28/2021   FREET4 1.0 04/09/2021   FREET4 1.5 02/05/2021   FREET4 0.8 10/26/2020   FREET4 5.22 (H) 06/24/2020   FREET4 5.7 (H) 05/21/2020   T3FREE 3.5 09/30/2022   Antithyroid antibodies: No results found for: THGAB No components found for: TPOAB  She previously had: - no weight gain - + fatigue - chronic, improved after RAI tx -  + cold intolerance, then hot flushes - no depression or anxiety - + constipation - + dry skin - + hair loss/thinning This symptoms mostly persist.  She had a very small thyroid  nodule diagnosed in 2021.  On the latest ultrasound, this has decreased in size: Thyroid  U/S (06/08/2020): Parenchymal Echotexture: Moderately heterogenous gland appears diffusely hyperemic (representative images 4, 7 and 21).  Isthmus: Normal in size measuring 0.7 cm in diameter  Right lobe: Normal in size measuring 4.4 x 1.8 x 1.5 cm  Left lobe: Normal in size measuring 4.4 x 1.7 x 1.6 cm Slight interval decreased conspicuity of previously visualized 0.6 cm solid nodule in the left inferior thyroid . No new discrete nodules.   IMPRESSION: Similar appearing diffuse thyroid  parenchymal heterogeneity with interval decreased hyperemia from August 22, 2019 comparison. No new discrete nodules.  Uptake and scan (07/15/2020): Findings thyroid  imaging.  4 hour I-123 uptake = 76.9% (normal 5-20%)  24 hour I-123 uptake = 51% (normal 10-30%)   IMPRESSION: Imaging findings, iodine  uptake and TSH suppression all consistent with Graves disease.  RAI treatment (08/07/2020)  Barium swallow (06/07/2022): Initial barium swallows demonstrate normal pharyngeal motion with swallowing. No laryngeal penetration or aspiration. No upper esophageal webs, strictures or diverticuli.   Normal esophageal motility. Normal mucosal folds. No intrinsic or extrinsic lesions are identified. No hiatal hernia or GE reflux demonstrated.   The 13  mm barium pill passed into the stomach without difficulty.   IMPRESSION: 1. Normal esophageal motility. 2. No hiatal hernia or GE reflux demonstrated. 3. No esophageal mass or stricture.  Pt denies feeling nodules in neck, hoarseness, odynophagia.  She previously had dysphagia -occasionally difficult to even swallow saliva.  She also had postnasal drip.  Symptoms resolved with  antihistaminics.  She has + FH of thyroid  disorders in - older sister:  thyroid  cancer.  No h/o radiation tx to head or neck  - other than RAI tx. No use of iodine  supplements.  Pt. also has a history of hysterectomy in 09/2021 for DUB.  ROS: + See HPI  Past Medical History:  Diagnosis Date   Abnormal uterine bleeding 2022   Anemia 02/12/2021   Patient is taking iron supplementation.   Cancer [facility])    breast   COVID-19 2020   cold-like symptoms   Endometriosis    Fibroids    Graves disease    Hypertension    Hyperthyroidism 2022   s/p Nuclear Medicine RAI therapy on 08/07/20   Personal history of chemotherapy    Personal history of radiation therapy    Port-A-Cath in place 08/02/2022   Seasonal allergies    severe chronic allergies, runny nose, congestion, cough when she lays down due to drainage   Vitamin D  deficiency 2021   Wears glasses    Past Surgical History:  Procedure Laterality Date   BREAST BIOPSY Right 06/06/2022   US  RT BREAST BX W LOC DEV 1ST LESION IMG BX SPEC US  GUIDE 06/06/2022 [facility]MOGRAP[facility]   BREAST LUMPECTOMY     BREAST LUMPECTOMY WITH SENTINEL LYMPH NODE BIOPSY Right 07/05/2022   Procedure: RIGHT BREAST LUMPECTOMY WITH SENTINEL LYMPH NODE BX;  Surgeon: Aron Shoulders, MD;  Location: [facility];  Service: General;  Laterality: Right;   CYSTOSCOPY  10/04/2021   Procedure: CYSTOSCOPY;  Surgeon: Armond Cape, MD;  Location: [facility];  Service: Gynecology;;   LAPAROSCOPIC VAGINAL HYSTERECTOMY WITH SALPINGECTOMY N/A 10/04/2021   Procedure: LAPAROSCOPIC ASSISTED VAGINAL HYSTERECTOMY WITH SALPINGECTOMY;  Surgeon: Armond Cape, MD;  Location: [facility];  Service: Gynecology;  Laterality: N/A;   PORT-A-CATH REMOVAL N/A 09/12/2023   Procedure: REMOVAL PORT-A-CATH;  Surgeon: Aron Shoulders, MD;  Location: [facility];  Service: General;  Laterality: N/A;   PORTACATH PLACEMENT Left 07/05/2022    Procedure: INSERTION PORT-A-CATH;  Surgeon: Aron Shoulders, MD;  Location: [facility];  Service: General;  Laterality: Left;   TUBAL LIGATION  09/2008   Social History   Socioeconomic History   Marital status: Married    Spouse name: Not on file   Number of children: Not on file   Years of education: Not on file   Highest education level: Not on file  Occupational History   Not on file  Tobacco Use   Smoking status: Never   Smokeless tobacco: Never  Vaping Use   Vaping status: Never Used  Substance and Sexual Activity   Alcohol use: No   Drug use: No   Sexual activity: Yes    Partners: Male    Birth control/protection: Surgical    Comment: BTL   Other Topics Concern   Not on file  Social History Narrative   Not on file   Social Drivers of Health   Financial Resource Strain: Not on file  Food Insecurity: No Food Insecurity (01/12/2023)   Hunger Vital Sign    Worried About Running Out  of Food in the Last Year: Never true    Ran Out of Food in the Last Year: Never true  Transportation Needs: No Transportation Needs (01/12/2023)   PRAPARE - Administrator, Civil Service (Medical): No    Lack of Transportation (Non-Medical): No  Physical Activity: Not on file  Stress: Not on file  Social Connections: Not on file  Intimate Partner Violence: Not At Risk (01/12/2023)   Humiliation, Afraid, Rape, and Kick questionnaire    Fear of Current or Ex-Partner: No    Emotionally Abused: No    Physically Abused: No    Sexually Abused: No   Current Outpatient Medications on File Prior to Visit  Medication Sig Dispense Refill   albuterol  (VENTOLIN  HFA) 108 (90 Base) MCG/ACT inhaler TAKE 2 PUFFS BY MOUTH EVERY 6 HOURS AS NEEDED FOR WHEEZE OR SHORTNESS OF BREATH 8.5 each 0   amLODipine  (NORVASC ) 5 MG tablet TAKE 1 TABLET (5 MG TOTAL) BY MOUTH DAILY. 90 tablet 3   ferrous sulfate  325 (65 FE) MG tablet Take 325 mg by mouth every other day.     levothyroxine   (SYNTHROID ) 50 MCG tablet TAKE 1 TABLET BY MOUTH EVERY DAY 90 tablet 0   oxyCODONE  (OXY IR/ROXICODONE ) 5 MG immediate release tablet Take 1 tablet (5 mg total) by mouth every 6 (six) hours as needed for severe pain (pain score 7-10). 5 tablet 0   tamoxifen  (NOLVADEX ) 10 MG tablet TAKE 1 TABLET BY MOUTH TWICE A DAY 60 tablet 11   valACYclovir (VALTREX) 500 MG tablet Take 500 mg by mouth as needed.     No current facility-administered medications on file prior to visit.   Allergies  Allergen Reactions   Latex Itching   Family History  Problem Relation Age of Onset   Breast cancer Sister 86       triple negative; d. early 88s   Cancer Paternal Aunt        unknown type   Thyroid  cancer Half-Sister        mat half sister; dx 55s; unknown cell type   PE: BP 136/74   Pulse 76   Ht 5' 6 (1.676 m)   Wt 150 lb 3.2 oz (68.1 kg)   LMP 09/09/2021 (Exact Date)   SpO2 98%   BMI 24.24 kg/m  Wt Readings from Last 15 Encounters:  04/08/24 150 lb 3.2 oz (68.1 kg)  03/25/24 149 lb 6.4 oz (67.8 kg)  09/22/23 139 lb (63 kg)  09/12/23 137 lb 9.1 oz (62.4 kg)  08/11/23 135 lb (61.2 kg)  07/04/23 141 lb (64 kg)  06/12/23 140 lb 4.8 oz (63.6 kg)  06/02/23 139 lb (63 kg)  05/23/23 141 lb 8 oz (64.2 kg)  05/02/23 142 lb 14.4 oz (64.8 kg)  04/11/23 144 lb 8 oz (65.5 kg)  04/07/23 144 lb 12.8 oz (65.7 kg)  03/22/23 142 lb 8 oz (64.6 kg)  02/28/23 142 lb 6 oz (64.6 kg)  02/07/23 142 lb 9.6 oz (64.7 kg)   Constitutional: normal weight, in NAD Eyes:  EOMI, no exophthalmos ENT: no neck masses, no cervical lymphadenopathy Cardiovascular: RRR, No MRG Respiratory: CTA B Musculoskeletal: no deformities Skin:no rashes Neurological: no tremor with outstretched hands  ASSESSMENT: 1. Postablative Hypothyroidism - h/o Graves ds.  2.  Thyroid  nodule  PLAN:  1. Patient with longstanding hypothyroidism, on low dose levothyroxine  therapy - latest thyroid  labs reviewed with pt. >> normal: Lab  Results  Component Value Date   TSH  1.99 05/26/2023  - she continues on LT4 50 mcg daily - pt feels good on this dose. - she does inquire about how long she will need to take the levothyroxine  and I advised her that this is usually for life. - we discussed about taking the thyroid  hormone every day, with water, >30 minutes before breakfast, separated by >4 hours from acid reflux medications, calcium, iron, multivitamins. Pt. is taking it correctly. - will check thyroid  tests today: TSH and fT4 - If labs are abnormal, she will need to return for repeat TFTs in 1.5 months - OTW, RTC in 1 year  2.  Thyroid  nodule - No masses felt on palpation of her neck today -She had a thyroid  ultrasound in 05/2020 showing heterogeneity of the thyroid  gland, with decreased blood flow in the left thyroid  nodule, that was decreased in size, measuring less than 0.6 cm.  In 2023, she described dysphagia, which I suspected was related to nasal drip, but she strongly felt that this was related to the thyroid  so we ended up obtaining an esophageal barium swallow in 05/2022.  This was normal.  The dysphagia improved with antihistaminics that she received with chemotherapy, confirming an allergic etiology -We discussed that her thyroid  nodule is very small, and unlikely to cause any problems with swallowing.  The thyroid  lobes were also not enlarged on the last ultrasound. - No further imaging investigation is needed for this  Needs refills.  Lela Fendt, MD PhD [facility]

## 2024-04-09 ENCOUNTER — Ambulatory Visit: Payer: Self-pay | Admitting: Internal Medicine

## 2024-04-09 MED ORDER — LEVOTHYROXINE SODIUM 50 MCG PO TABS: 50.0000 ug | ORAL_TABLET | Freq: Every day | ORAL | 3 refills | Status: AC

## 2024-04-09 NOTE — Addendum Note (Signed)
 Addended by: TRIXIE FILE on: 04/09/2024 11:32 AM   Modules accepted: Orders

## 2024-04-25 ENCOUNTER — Ambulatory Visit: Payer: Self-pay

## 2024-04-25 NOTE — Telephone Encounter (Signed)
 FYI Only or Action Required?: FYI only for provider: appointment scheduled on 04/26/2024.  Patient was last seen in primary care on 08/11/2023 by Jordan, [name], MD.  Called Nurse Triage reporting Sore Throat.  Symptoms began weekend and worsening.  Interventions attempted: Nothing.  Symptoms are: gradually worsening.  Triage Disposition: See Physician Within 24 Hours  Patient/caregiver understands and will follow disposition?: Yes     Copied from CRM #[id number]. Topic: Clinical - Red Word Triage >> Apr 25, 2024  4:59 PM China J wrote: [facility] that prompted transfer to Nurse Triage: Patient has a bad cough that is back again along with sinus congestion and a sore throat. Reason for Disposition  Diabetes mellitus or weak immune system (e.g., HIV positive, cancer chemo, splenectomy, organ transplant, chronic steroids)  Answer Assessment - Initial Assessment Questions 1. ONSET: When did the throat start hurting? (Hours or days ago)      weekend 2. SEVERITY: How bad is the sore throat? (Scale 1-10; mild, moderate or severe     Mild to moderate 3. STREP EXPOSURE: Has there been any exposure to strep within the past week? If Yes, ask: What type of contact occurred?      na 4.  VIRAL SYMPTOMS: Are there any symptoms of a cold, such as a runny nose, cough, hoarse voice or red eyes?      Hoarse, stuffy nose, watery eyes, cough-clear, right ear pain  5. FEVER: Do you have a fever? If Yes, ask: What is your temperature, how was it measured, and when did it start?     no 6. PUS ON THE TONSILS: Is there pus on the tonsils in the back of your throat?     no 7. OTHER SYMPTOMS: Do you have any other symptoms? (e.g., difficulty breathing, headache, rash)     Blisters in throat, coughing spells with SOB, sinus congestion, post nasal drip 8. PREGNANCY: Is there any chance you are pregnant? When was your last menstrual period?     na  Protocols used: Sore Throat-A-AH

## 2024-04-26 ENCOUNTER — Encounter: Payer: Self-pay | Admitting: Family Medicine

## 2024-04-26 ENCOUNTER — Ambulatory Visit (INDEPENDENT_AMBULATORY_CARE_PROVIDER_SITE_OTHER)

## 2024-04-26 ENCOUNTER — Ambulatory Visit (INDEPENDENT_AMBULATORY_CARE_PROVIDER_SITE_OTHER): Admitting: Family Medicine

## 2024-04-26 VITALS — BP 134/80 | HR 62 | Temp 97.7°F | Wt 150.0 lb

## 2024-04-26 DIAGNOSIS — Z8639 Personal history of other endocrine, nutritional and metabolic disease: Secondary | ICD-10-CM | POA: Diagnosis not present

## 2024-04-26 DIAGNOSIS — Z862 Personal history of diseases of the blood and blood-forming organs and certain disorders involving the immune mechanism: Secondary | ICD-10-CM

## 2024-04-26 DIAGNOSIS — J069 Acute upper respiratory infection, unspecified: Secondary | ICD-10-CM

## 2024-04-26 DIAGNOSIS — R051 Acute cough: Secondary | ICD-10-CM

## 2024-04-26 LAB — CBC WITH DIFFERENTIAL/PLATELET
Basophils Absolute: 0 K/uL (ref 0.0–0.1)
Basophils Relative: 0.7 % (ref 0.0–3.0)
Eosinophils Absolute: 0.2 K/uL (ref 0.0–0.7)
Eosinophils Relative: 4.2 % (ref 0.0–5.0)
HCT: 40.9 % (ref 36.0–46.0)
Hemoglobin: 13.4 g/dL (ref 12.0–15.0)
Lymphocytes Relative: 37.6 % (ref 12.0–46.0)
Lymphs Abs: 1.9 K/uL (ref 0.7–4.0)
MCHC: 32.7 g/dL (ref 30.0–36.0)
MCV: 91.3 fl (ref 78.0–100.0)
Monocytes Absolute: 0.4 K/uL (ref 0.1–1.0)
Monocytes Relative: 7.1 % (ref 3.0–12.0)
Neutro Abs: 2.6 K/uL (ref 1.4–7.7)
Neutrophils Relative %: 50.4 % (ref 43.0–77.0)
Platelets: 158 K/uL (ref 150.0–400.0)
RBC: 4.48 Mil/uL (ref 3.87–5.11)
RDW: 14.6 % (ref 11.5–15.5)
WBC: 5.1 K/uL (ref 4.0–10.5)

## 2024-04-26 LAB — VITAMIN D 25 HYDROXY (VIT D DEFICIENCY, FRACTURES): VITD: 37.4 ng/mL (ref 30.00–100.00)

## 2024-04-26 MED ORDER — BENZONATATE 100 MG PO CAPS: 100.0000 mg | ORAL_CAPSULE | Freq: Three times a day (TID) | ORAL | 0 refills | Status: AC | PRN

## 2024-04-26 NOTE — Progress Notes (Signed)
 Established Patient Office Visit  Subjective   Patient ID: Dawn Bates, female    DOB: [birth year]  Age: 53 y.o. MRN: [id number]  Chief Complaint  Patient presents with   Cough   Nasal Congestion   Sore Throat    HPI   Dawn Bates is seen with onset about 6 days ago of cough, sore throat, hoarseness, right ear pain, and nasal congestion.  She helps care for her grandson who attends daycare who is about 1-1/2.  He has had some recent upper respiratory infections.  There has been no recent fever.  She has had some fatigue.  Cough particularly bothersome at night and interfering with sleep.  She states she has had recurrent pneumonia in the past and is concerned about that though she denies any fever.  Patient has past history of vitamin D  deficiency and requesting vitamin D  level.  She also has history of chronic anemia and has had iron deficiency in the past.  She has had previous hysterectomy now and requesting follow-up CBC and iron levels.  Past Medical History:  Diagnosis Date   Abnormal uterine bleeding 2022   Anemia 02/12/2021   Patient is taking iron supplementation.   Cancer [facility])    breast   COVID-19 2020   cold-like symptoms   Endometriosis    Fibroids    Graves disease    Hypertension    Hyperthyroidism 2022   s/p Nuclear Medicine RAI therapy on 08/07/20   Personal history of chemotherapy    Personal history of radiation therapy    Port-A-Cath in place 08/02/2022   Seasonal allergies    severe chronic allergies, runny nose, congestion, cough when she lays down due to drainage   Vitamin D  deficiency 2021   Wears glasses    Past Surgical History:  Procedure Laterality Date   BREAST BIOPSY Right 06/06/2022   US  RT BREAST BX W LOC DEV 1ST LESION IMG BX SPEC US  GUIDE 06/06/2022 [facility]   BREAST LUMPECTOMY     BREAST LUMPECTOMY WITH SENTINEL LYMPH NODE BIOPSY Right 07/05/2022   Procedure: RIGHT BREAST LUMPECTOMY WITH SENTINEL LYMPH NODE BX;   Surgeon: Aron Shoulders, MD;  Location: [facility];  Service: General;  Laterality: Right;   CYSTOSCOPY  10/04/2021   Procedure: CYSTOSCOPY;  Surgeon: Armond Cape, MD;  Location: [facility];  Service: Gynecology;;   LAPAROSCOPIC VAGINAL HYSTERECTOMY WITH SALPINGECTOMY N/A 10/04/2021   Procedure: LAPAROSCOPIC ASSISTED VAGINAL HYSTERECTOMY WITH SALPINGECTOMY;  Surgeon: Armond Cape, MD;  Location: [facility];  Service: Gynecology;  Laterality: N/A;   PORT-A-CATH REMOVAL N/A 09/12/2023   Procedure: REMOVAL PORT-A-CATH;  Surgeon: Aron Shoulders, MD;  Location: [facility];  Service: General;  Laterality: N/A;   PORTACATH PLACEMENT Left 07/05/2022   Procedure: INSERTION PORT-A-CATH;  Surgeon: Aron Shoulders, MD;  Location: [facility];  Service: General;  Laterality: Left;   TUBAL LIGATION  09/2008    reports that she has never smoked. She has never used smokeless tobacco. She reports that she does not drink alcohol and does not use drugs. family history includes Breast cancer (age of onset: 87) in her sister; Cancer in her paternal aunt; Thyroid  cancer in her half-sister. Allergies  Allergen Reactions   Latex Itching    Review of Systems  Constitutional:  Negative for chills and fever.  HENT:  Positive for congestion and sore throat.   Respiratory:  Positive for cough. Negative for hemoptysis, shortness of breath and wheezing.  Cardiovascular:  Negative for chest pain.      Objective:     BP 134/80   Pulse 62   Temp 97.7 F (36.5 C) (Oral)   Wt 150 lb (68 kg)   LMP 09/09/2021 (Exact Date)   SpO2 99%   BMI 24.21 kg/m  BP Readings from Last 3 Encounters:  04/26/24 134/80  04/08/24 136/74  03/25/24 139/80   Wt Readings from Last 3 Encounters:  04/26/24 150 lb (68 kg)  04/08/24 150 lb 3.2 oz (68.1 kg)  03/25/24 149 lb 6.4 oz (67.8 kg)      Physical Exam Vitals reviewed.  Constitutional:       Appearance: She is well-developed.  HENT:     Ears:     Comments: Cerumen right canal which is obstructing view of eardrum    Mouth/Throat:     Mouth: Mucous membranes are moist.     Pharynx: Oropharynx is clear. No oropharyngeal exudate or posterior oropharyngeal erythema.  Eyes:     Pupils: Pupils are equal, round, and reactive to light.  Neck:     Thyroid : No thyromegaly.     Vascular: No JVD.  Cardiovascular:     Rate and Rhythm: Normal rate and regular rhythm.     Heart sounds:     No gallop.  Pulmonary:     Effort: Pulmonary effort is normal. No respiratory distress.     Breath sounds: Normal breath sounds. No wheezing or rales.  Musculoskeletal:     Cervical back: Neck supple.  Neurological:     Mental Status: She is alert.      No results found for any visits on 04/26/24.    The 10-year ASCVD risk score (Arnett DK, et al., 2019) is: 7.1%    Assessment & Plan:   Problem List Items Addressed This Visit   None Visit Diagnoses       Viral URI with cough    -  Primary     History of anemia       Relevant Orders   CBC with Differential/Platelet   Iron, TIBC and Ferritin Panel     History of vitamin D  deficiency       Relevant Orders   VITAMIN D  25 Hydroxy (Vit-D Deficiency, Fractures)     Acute cough       Relevant Orders   DG Chest 2 View     Patient seen with acute cough over the past week.  Suspect viral URI.  She states she has had recurrent pneumonia.  She does not any red flags such as fever or rales on exam we will obtain x-ray of the chest to make sure.  Discussed Tessalon  Perles 100 mg every 8 hours as needed for cough.  Follow-up promptly for any fever or increased shortness of breath  Patient requesting labs as above to reassess vitamin D  level along with CBC and iron studies and these will be drawn today.  No follow-ups on file.    Wolm Scarlet, MD

## 2024-04-27 LAB — IRON,TIBC AND FERRITIN PANEL
%SAT: 26 % (ref 16–45)
Ferritin: 21 ng/mL (ref 16–232)
Iron: 87 ug/dL (ref 45–160)
TIBC: 334 ug/dL (ref 250–450)

## 2024-04-28 ENCOUNTER — Ambulatory Visit: Payer: Self-pay | Admitting: Family Medicine

## 2024-06-27 ENCOUNTER — Other Ambulatory Visit: Payer: Self-pay

## 2024-07-04 ENCOUNTER — Other Ambulatory Visit: Payer: Self-pay

## 2024-07-04 ENCOUNTER — Telehealth: Payer: Self-pay

## 2024-07-04 NOTE — Telephone Encounter (Signed)
 T/C from pt stating she is having difficulty reading her laptop and and having some blurred vision. Pt is not currently receiving treatment and was advised to contact her PCP for evaluation.

## 2025-04-08 ENCOUNTER — Ambulatory Visit: Admitting: Internal Medicine
